# Patient Record
Sex: Male | Born: 1955 | Race: White | Hispanic: No | Marital: Single | Smoking: Never smoker
Health system: Southern US, Community
[De-identification: ages and names within clinical notes are randomized; demographics above are authoritative.]

## PROBLEM LIST (undated history)

## (undated) DIAGNOSIS — C801 Malignant (primary) neoplasm, unspecified: Secondary | ICD-10-CM

## (undated) DIAGNOSIS — I10 Essential (primary) hypertension: Secondary | ICD-10-CM

## (undated) DIAGNOSIS — E119 Type 2 diabetes mellitus without complications: Secondary | ICD-10-CM

## (undated) DIAGNOSIS — N289 Disorder of kidney and ureter, unspecified: Secondary | ICD-10-CM

## (undated) NOTE — *Deleted (*Deleted)
Cross Cover Note Patient admitted with Sepsi from CAP now with tachycardia. Previously elevated lactate noted to be secondary to metformin use and med has been held.  Patient now with tachycarda.  CBC noted for drop in HGB 7.9, previously 8.2 one month ago.  BP currently stable Repeat lactate Repeat CBC  Lactate, now normsl.  1 gm drop in hgb since this am  -  No evidence of active bleeding. Likely hemodilution from IV fluid therapy. Will check stool. Protonix added Tachycardia and pale on assessment, will transfuse one unit of blood  Discussed risks and benefits with POA and patient  POA at bedside reports prior to coming to hospital patient had episode while on toilet he stood up and appeared extremely bloated and complained of severe RUQ pain.  [place] she reports patient problem with nausea and vomiting over past couple months.  They both denied seeing blood in vomit or stool and no dark tarry stools.  Has had constipation and takes senokot daily. Last BM this am after getting lactulose  Patient wants to think about blood transfusion

---

## 2008-04-05 ENCOUNTER — Ambulatory Visit: Payer: Self-pay | Admitting: Family Medicine

## 2010-11-10 ENCOUNTER — Other Ambulatory Visit: Payer: Self-pay | Admitting: Rheumatology

## 2010-12-21 ENCOUNTER — Encounter: Payer: Self-pay | Admitting: Rheumatology

## 2011-01-03 ENCOUNTER — Encounter: Payer: Self-pay | Admitting: Rheumatology

## 2011-02-03 ENCOUNTER — Encounter: Payer: Self-pay | Admitting: Rheumatology

## 2011-03-06 ENCOUNTER — Encounter: Payer: Self-pay | Admitting: Rheumatology

## 2019-04-19 ENCOUNTER — Other Ambulatory Visit: Payer: Self-pay

## 2019-04-19 DIAGNOSIS — Z20822 Contact with and (suspected) exposure to covid-19: Secondary | ICD-10-CM

## 2019-04-20 ENCOUNTER — Telehealth: Payer: Self-pay | Admitting: General Practice

## 2019-04-20 LAB — NOVEL CORONAVIRUS, NAA: SARS-CoV-2, NAA: NOT DETECTED

## 2019-04-20 NOTE — Telephone Encounter (Signed)
Negative COVID results given. Patient results "NOT Detected." Caller expressed understanding. ° °

## 2019-05-04 ENCOUNTER — Other Ambulatory Visit: Payer: Self-pay

## 2019-05-04 DIAGNOSIS — Z20822 Contact with and (suspected) exposure to covid-19: Secondary | ICD-10-CM

## 2019-05-05 LAB — NOVEL CORONAVIRUS, NAA: SARS-CoV-2, NAA: NOT DETECTED

## 2019-05-07 ENCOUNTER — Telehealth: Payer: Self-pay | Admitting: General Practice

## 2019-05-07 NOTE — Telephone Encounter (Signed)
Negative COVID results given. Patient results "NOT Detected." Caller expressed understanding. ° °

## 2019-08-27 ENCOUNTER — Emergency Department: Payer: Medicaid Other

## 2019-08-27 ENCOUNTER — Encounter: Payer: Self-pay | Admitting: Emergency Medicine

## 2019-08-27 ENCOUNTER — Emergency Department
Admission: EM | Admit: 2019-08-27 | Discharge: 2019-08-27 | Disposition: A | Discharge status: Home / Self Care | Payer: Medicaid Other | Attending: Emergency Medicine | Admitting: Emergency Medicine

## 2019-08-27 ENCOUNTER — Other Ambulatory Visit: Payer: Self-pay

## 2019-08-27 DIAGNOSIS — I10 Essential (primary) hypertension: Secondary | ICD-10-CM | POA: Diagnosis not present

## 2019-08-27 DIAGNOSIS — R410 Disorientation, unspecified: Secondary | ICD-10-CM | POA: Diagnosis not present

## 2019-08-27 DIAGNOSIS — C649 Malignant neoplasm of unspecified kidney, except renal pelvis: Secondary | ICD-10-CM | POA: Insufficient documentation

## 2019-08-27 DIAGNOSIS — R109 Unspecified abdominal pain: Secondary | ICD-10-CM | POA: Diagnosis present

## 2019-08-27 HISTORY — DX: Malignant (primary) neoplasm, unspecified: C80.1

## 2019-08-27 HISTORY — DX: Essential (primary) hypertension: I10

## 2019-08-27 HISTORY — DX: Disorder of kidney and ureter, unspecified: N28.9

## 2019-08-27 LAB — CBC WITH DIFFERENTIAL/PLATELET
Abs Immature Granulocytes: 0.01 10*3/uL (ref 0.00–0.07)
Basophils Absolute: 0 10*3/uL (ref 0.0–0.1)
Basophils Relative: 1 %
Eosinophils Absolute: 0.1 10*3/uL (ref 0.0–0.5)
Eosinophils Relative: 2 %
HCT: 33.3 % — ABNORMAL LOW (ref 39.0–52.0)
Hemoglobin: 10.1 g/dL — ABNORMAL LOW (ref 13.0–17.0)
Immature Granulocytes: 0 %
Lymphocytes Relative: 18 %
Lymphs Abs: 1 10*3/uL (ref 0.7–4.0)
MCH: 22 pg — ABNORMAL LOW (ref 26.0–34.0)
MCHC: 30.3 g/dL (ref 30.0–36.0)
MCV: 72.4 fL — ABNORMAL LOW (ref 80.0–100.0)
Monocytes Absolute: 0.5 10*3/uL (ref 0.1–1.0)
Monocytes Relative: 9 %
Neutro Abs: 3.6 10*3/uL (ref 1.7–7.7)
Neutrophils Relative %: 70 %
Platelets: 99 10*3/uL — ABNORMAL LOW (ref 150–400)
RBC: 4.6 MIL/uL (ref 4.22–5.81)
RDW: 16.5 % — ABNORMAL HIGH (ref 11.5–15.5)
WBC: 5.2 10*3/uL (ref 4.0–10.5)
nRBC: 0 % (ref 0.0–0.2)

## 2019-08-27 LAB — COMPREHENSIVE METABOLIC PANEL
ALT: 32 U/L (ref 0–44)
AST: 78 U/L — ABNORMAL HIGH (ref 15–41)
Albumin: 3.1 g/dL — ABNORMAL LOW (ref 3.5–5.0)
Alkaline Phosphatase: 144 U/L — ABNORMAL HIGH (ref 38–126)
Anion gap: 13 (ref 5–15)
BUN: 19 mg/dL (ref 8–23)
CO2: 26 mmol/L (ref 22–32)
Calcium: 9.1 mg/dL (ref 8.9–10.3)
Chloride: 99 mmol/L (ref 98–111)
Creatinine, Ser: 0.87 mg/dL (ref 0.61–1.24)
GFR calc Af Amer: >60 mL/min (ref >60)
GFR calc non Af Amer: >60 mL/min (ref >60)
Glucose, Bld: 140 mg/dL — ABNORMAL HIGH (ref 70–99)
Potassium: 4.2 mmol/L (ref 3.5–5.1)
Sodium: 138 mmol/L (ref 135–145)
Total Bilirubin: 1.8 mg/dL — ABNORMAL HIGH (ref 0.3–1.2)
Total Protein: 8.7 g/dL — ABNORMAL HIGH (ref 6.5–8.1)

## 2019-08-27 LAB — URINALYSIS, COMPLETE (UACMP) WITH MICROSCOPIC
Bacteria, UA: NONE SEEN
Bilirubin Urine: NEGATIVE
Glucose, UA: 50 mg/dL — AB
Hgb urine dipstick: NEGATIVE
Ketones, ur: NEGATIVE mg/dL
Leukocytes,Ua: NEGATIVE
Nitrite: NEGATIVE
Protein, ur: 100 mg/dL — AB
Specific Gravity, Urine: 1.02 (ref 1.005–1.030)
pH: 6 (ref 5.0–8.0)

## 2019-08-27 LAB — AMMONIA: Ammonia: 20 umol/L (ref 9–35)

## 2019-08-27 MED ORDER — GADOBUTROL 1 MMOL/ML IV SOLN
9.0000 mL | Freq: Once | INTRAVENOUS | Status: AC | PRN
  Administered 2019-08-27: 9 mL via INTRAVENOUS
  Filled 2019-08-27: qty 10

## 2019-08-27 MED ORDER — LORAZEPAM 2 MG/ML IJ SOLN
1.0000 mg | Freq: Once | INTRAMUSCULAR | Status: AC
  Administered 2019-08-27: 1 mg via INTRAVENOUS
  Filled 2019-08-27: qty 1

## 2019-08-27 NOTE — ED Notes (Signed)
AAOx3.  Skin warm and dry.  NAD.  States he usually receives fluid at [facility] every week and he felt that his doctors office sent him to the ED to receive fluid for dehydration.  Reinforced that patient presented to the ED today for HTN and typically one does not receive fluid for that, but reassured patient that I would let Dr. Archie Balboa know his concern.

## 2019-08-27 NOTE — ED Notes (Signed)
Pt leaving for imaging.

## 2019-08-27 NOTE — ED Notes (Signed)
Pt remains on phone with MRI staff.

## 2019-08-27 NOTE — ED Notes (Signed)
Patient's significant other arrives to bedside.

## 2019-08-27 NOTE — ED Notes (Signed)
Pt remains off unit a CT. Will make request for urine sample once pt back to room.

## 2019-08-27 NOTE — ED Triage Notes (Signed)
Pt currently taking oral chemo for renal cancer.

## 2019-08-27 NOTE — ED Notes (Signed)
See triage note. Pt currently denies HA, CP, or nausea. States was nauseous earlier this morning but that it is typical since the chemo. Current BP 139/95; MAP 110. Pt in NAD currently. Reports did not take his Metoprolol this morning as his friend did not give it to him.

## 2019-08-27 NOTE — Discharge Instructions (Addendum)
Please seek medical attention for any high fevers, chest pain, shortness of breath, change in behavior, persistent vomiting, bloody stool or any other new or concerning symptoms.  

## 2019-08-27 NOTE — ED Notes (Signed)
Pt/wife signed printed d/c paperwork as topaz froze.

## 2019-08-27 NOTE — ED Triage Notes (Signed)
Pt here with c/o hypertension that began last night, states his Dr office sent him here to be evaluated, does take bp med but didn't take this am. NAD, denies pain.

## 2019-08-27 NOTE — ED Provider Notes (Signed)
[facility] Emergency Department Provider Note  ____________________________________________   I have reviewed the triage vital signs and the nursing notes.   HISTORY  Chief Complaint Abdominal pain  History limited by: Not Limited   HPI Hunter Davila is a 64 y.o. male who presents to the emergency department today at the request of his oncologist because of varied symptoms. The patient's first complaint is for some abdominal pain that he had starting last night. Located in the center part of his abdomen it was associated with some nausea. He denies any pain at the time of my exam. Additionally the patient states that he had fevers a couple of days ago but none today. Has also been having issues with his blood pressure and had a nosebleed earlier today. The patient says that he takes oral chemo at home and took a dose last night.    Records reviewed. Per medical record review patient has a history of renal cancer. Hypertension.  Past Medical History:  Diagnosis Date  . Cancer ([place])   . Hypertension   . Renal disorder    Renal cancer    There are no problems to display for this patient.   History reviewed. No pertinent surgical history.  Prior to Admission medications   Not on File    Allergies Patient has no known allergies.  No family history on file.  Social History Social History   Tobacco Use  . Smoking status: Never Smoker  . Smokeless tobacco: Never Used  Substance Use Topics  . Alcohol use: Not Currently  . Drug use: Not on file    Review of Systems Constitutional: Positive for fever a couple of days ago. Eyes: No visual changes. ENT: Positive for nose bleed. Cardiovascular: Denies chest pain. Respiratory: Denies shortness of breath. Gastrointestinal: Positive for abdominal pain and nausea.   Genitourinary: Negative for dysuria. Musculoskeletal: Negative for back pain. Skin: Negative for rash. Neurological: Positive  for headache. ____________________________________________   PHYSICAL EXAM:  VITAL SIGNS: ED Triage Vitals [08/27/19 1149]  Enc Vitals Group     BP (!) 157/94     Pulse Rate 86     Resp 18     Temp 98.3 F (36.8 C)     Temp Source Oral     SpO2 100 %     Weight 220 lb (99.8 kg)     Height 6' 2" (1.88 m)   Constitutional: Alert and oriented.  Eyes: Conjunctivae are normal.  ENT      Head: Normocephalic and atraumatic.      Nose: No congestion/rhinnorhea.      Mouth/Throat: Mucous membranes are moist.      Neck: No stridor. Hematological/Lymphatic/Immunilogical: No cervical lymphadenopathy. Cardiovascular: Normal rate, regular rhythm.  No murmurs, rubs, or gallops.  Respiratory: Normal respiratory effort without tachypnea nor retractions. Breath sounds are clear and equal bilaterally. No wheezes/rales/rhonchi. Gastrointestinal: Soft and non tender. No rebound. No guarding.  Genitourinary: Deferred Musculoskeletal: Normal range of motion in all extremities. No lower extremity edema. Neurologic:  Normal speech and language. No gross focal neurologic deficits are appreciated.  Skin:  Skin is warm, dry and intact. No rash noted. Psychiatric: Mood and affect are normal. Speech and behavior are normal. Patient exhibits appropriate insight and judgment.  ____________________________________________    LABS (pertinent positives/negatives)  CMP na 138, k 4.2, glu 140, cr 0.87, ast 78, alt 32, alk phos 144, t bili 1.8 CBC wbc 5.2, hgb 10.1, plt 99 UA clear, glucose 50,  protein 100, 6-10 wbc, bacteria none seen, leukocytes negative Ammonia 20 ____________________________________________   EKG  None  ____________________________________________    RADIOLOGY  CT head Punctate hyperdense legion in the left parietal lobe  MR brain No acute  abnormality  ____________________________________________   PROCEDURES  Procedures  ____________________________________________   INITIAL IMPRESSION / ASSESSMENT AND PLAN / ED COURSE  Pertinent labs & imaging results that were available during my care of the patient were reviewed by me and considered in my medical decision making (see chart for details).   Patient presented to the emergency department for a primary concern for confusion.  On initial exam patient seemed fairly lucid however during his stay here in the emergency department he did have signs of worsening confusion.  Did get a CT scan which was concerning for possible lesion in the left parietal lobe.  MRI was then obtained which did not show any concerning acute abnormalities.  Additionally no obvious infection.  I did discussion with patient and wife.  At this point no clear etiology of the patient's confusion and wife states that it has been intermittent over the past couple of days.  They already have appointment for follow-up with oncology tomorrow.  Patient and wife felt comfortable with discharge home to follow-up as scheduled tomorrow in clinic.  ____________________________________________   FINAL CLINICAL IMPRESSION(S) / ED DIAGNOSES  Final diagnoses:  Confusion  Hypertension, unspecified type     Note: This dictation was prepared with Dragon dictation. Any transcriptional errors that result from this process are unintentional     Nance Pear, MD 08/27/19 2121

## 2019-08-27 NOTE — ED Notes (Signed)
Plan of care reviewed with pt.

## 2019-08-27 NOTE — ED Notes (Signed)
Pt standing at bedside attempting to use urinal. Urine dark amber. Sample sent to lab.

## 2019-08-27 NOTE — ED Notes (Signed)
Patient becoming more confused.  Pacing in room and out to door frequently.  Does not respond well to redirection.  Patient states "I am not having that MRI, I'm not doing it.".  Dr. Archie Balboa alerted.  Patient on the phone speaking with significant other.  Significant other encouraged to come to hospital and stay with patient for duration of ED coarse, per Dr. Archie Balboa.  Significant other on her way to hospital at this time.  Front desk notified of expected visitor.

## 2019-08-27 NOTE — ED Notes (Signed)
Attempted to straight stick patient x 2 for PT/ INR.  Unsuccessful.  Patient states "I am a hard stick."  Refused further attempts by nursing.  Requesting phlebotomy to draw blood work.

## 2019-09-06 ENCOUNTER — Telehealth: Payer: Self-pay | Admitting: Adult Health Nurse Practitioner

## 2019-09-06 NOTE — Telephone Encounter (Signed)
Spoke with Alwyn Pea Gover (POA) regarding Palliative services and she was in agreement with starting services.  I have scheduled an In-home Consult for 09/17/19 @ 1:30 PM

## 2019-09-17 ENCOUNTER — Other Ambulatory Visit: Payer: Medicaid Other | Admitting: Adult Health Nurse Practitioner

## 2019-09-17 ENCOUNTER — Other Ambulatory Visit: Payer: Self-pay

## 2019-09-17 DIAGNOSIS — Z515 Encounter for palliative care: Secondary | ICD-10-CM

## 2019-09-17 DIAGNOSIS — C649 Malignant neoplasm of unspecified kidney, except renal pelvis: Secondary | ICD-10-CM

## 2019-09-17 NOTE — Progress Notes (Signed)
[facility] Consult Note Telephone: [phone]  Fax: [phone]  PATIENT NAME: Hunter Davila DOB: 12-29-1955 MRN: [id number]  PRIMARY CARE PROVIDER:   Patient, No Pcp Per  REFERRING PROVIDER:  Elwin Mocha, MD [address] [place],   [address]  RESPONSIBLE PARTY:   [facility]  Primary phone # [phone]  C: [phone]    RECOMMENDATIONS and PLAN:  1.  Advanced care planning.  Patient is full code.  Did not go over ACP too much as patient did not want to discuss about death.  Did indicate that he would not want to be on machines for any extended period of time.  2.  Renal Cancer.  Patient is currently on oral chemo with lenvima 10 mg daily and everolimus 5mg  daily.  Was on lenvima 18 mg daily and this caused encephalopathy, increased BP, and N/V after about 4 weeks of use.  He had a a 2 week off period and has restarted at a lower dose about 4 days ago.  So far is not complaining of pain, N/V/D, constipation, increased BP, headaches, dizziness, SOB, cough, edema, neuropathy.  He is able to get up and ambulate without assistive devices.  Perform ADLs independently.  He does not drive right now. Was told he should not be staying alone while on this therapy and  he stays with a friend who has been caring for him.  She does state that while he was on the higher does he was requiring some assistance with ADLs.  She states that he is doing much better now and is requiring less assistance.  A few weeks ago he was treated for a sinus infection.  He does get hydration one to two times per week at cancer center.  His appetite is good with no reported weight loss.  Has MRI scheduled in April.  Continue follow up and recommendations by oncology.    Patient appears to be improving after being taken off his oral chemo and restarting at a lower dose. Patient really did not want palliative services as he expresses that he does not  want to be reminded that he is dying.  He has agreed to them for his friend who is helping take care of him.  She states that she could use the support when something happens, such as when his BP was elevated, to know what to do.  Patient has agreed to continue with palliative services on an as needed basis for caregiver support.  Left contact info and encouraged to call with questions/concerns.  I spent 60 minutes providing this consultation,  from 1:30 to 2:30. More than 50% of the time in this consultation was spent coordinating communication.   HISTORY OF PRESENT ILLNESS:  Hunter Davila is a 64 y.o. year old male with multiple medical problems including renal cancer, HTN, hypothyroidism, DMT2. Palliative Care was asked to help address goals of care.   CODE STATUS: full code  PPS: 60% HOSPICE ELIGIBILITY/DIAGNOSIS: TBD  PHYSICAL EXAM:  BP 140/88  HR  93  O2 99% on RA General: NAD, frail appearing Cardiovascular: regular rate and rhythm Pulmonary: lung sounds clear; normal respiratory effort Abdomen: soft, nontender, + bowel sounds Extremities: no edema, no joint deformities Skin: no rashes on exposed skin Neurological: Weakness but otherwise nonfocal  PAST MEDICAL HISTORY:  Past Medical History:  Diagnosis Date  . Cancer ([place])   . Hypertension   . Renal disorder    Renal cancer  SOCIAL HX:  Social History   Tobacco Use  . Smoking status: Never Smoker  . Smokeless tobacco: Never Used  Substance Use Topics  . Alcohol use: Not Currently    ALLERGIES: No Known Allergies   PERTINENT MEDICATIONS:  No outpatient encounter medications on file as of 09/17/2019.   No facility-administered encounter medications on file as of 09/17/2019.      Tanecia Mccay Jenetta Downer, NP

## 2020-03-18 ENCOUNTER — Encounter: Payer: Self-pay | Admitting: Medical Oncology

## 2020-03-18 ENCOUNTER — Emergency Department: Payer: Medicare Other

## 2020-03-18 ENCOUNTER — Inpatient Hospital Stay
Admission: EM | Admit: 2020-03-18 | Discharge: 2020-03-20 | DRG: 682 | Disposition: A | Discharge status: Home Health | Payer: Medicare Other | Attending: Internal Medicine | Admitting: Internal Medicine

## 2020-03-18 ENCOUNTER — Other Ambulatory Visit: Payer: Self-pay

## 2020-03-18 DIAGNOSIS — C649 Malignant neoplasm of unspecified kidney, except renal pelvis: Secondary | ICD-10-CM | POA: Diagnosis present

## 2020-03-18 DIAGNOSIS — N400 Enlarged prostate without lower urinary tract symptoms: Secondary | ICD-10-CM | POA: Diagnosis present

## 2020-03-18 DIAGNOSIS — E860 Dehydration: Secondary | ICD-10-CM | POA: Diagnosis present

## 2020-03-18 DIAGNOSIS — C7931 Secondary malignant neoplasm of brain: Secondary | ICD-10-CM | POA: Diagnosis present

## 2020-03-18 DIAGNOSIS — Z20822 Contact with and (suspected) exposure to covid-19: Secondary | ICD-10-CM | POA: Diagnosis present

## 2020-03-18 DIAGNOSIS — R6520 Severe sepsis without septic shock: Secondary | ICD-10-CM | POA: Diagnosis not present

## 2020-03-18 DIAGNOSIS — I10 Essential (primary) hypertension: Secondary | ICD-10-CM | POA: Diagnosis present

## 2020-03-18 DIAGNOSIS — K219 Gastro-esophageal reflux disease without esophagitis: Secondary | ICD-10-CM | POA: Diagnosis present

## 2020-03-18 DIAGNOSIS — E872 Acidosis, unspecified: Secondary | ICD-10-CM

## 2020-03-18 DIAGNOSIS — R4689 Other symptoms and signs involving appearance and behavior: Secondary | ICD-10-CM

## 2020-03-18 DIAGNOSIS — C641 Malignant neoplasm of right kidney, except renal pelvis: Secondary | ICD-10-CM | POA: Diagnosis present

## 2020-03-18 DIAGNOSIS — D84821 Immunodeficiency due to drugs: Secondary | ICD-10-CM | POA: Diagnosis present

## 2020-03-18 DIAGNOSIS — E119 Type 2 diabetes mellitus without complications: Secondary | ICD-10-CM | POA: Diagnosis not present

## 2020-03-18 DIAGNOSIS — E785 Hyperlipidemia, unspecified: Secondary | ICD-10-CM | POA: Diagnosis present

## 2020-03-18 DIAGNOSIS — R778 Other specified abnormalities of plasma proteins: Secondary | ICD-10-CM

## 2020-03-18 DIAGNOSIS — J9811 Atelectasis: Secondary | ICD-10-CM | POA: Diagnosis present

## 2020-03-18 DIAGNOSIS — A419 Sepsis, unspecified organism: Secondary | ICD-10-CM

## 2020-03-18 DIAGNOSIS — R000 Tachycardia, unspecified: Secondary | ICD-10-CM

## 2020-03-18 DIAGNOSIS — R410 Disorientation, unspecified: Secondary | ICD-10-CM

## 2020-03-18 DIAGNOSIS — L723 Sebaceous cyst: Secondary | ICD-10-CM | POA: Diagnosis present

## 2020-03-18 DIAGNOSIS — N179 Acute kidney failure, unspecified: Principal | ICD-10-CM | POA: Diagnosis present

## 2020-03-18 DIAGNOSIS — T383X5A Adverse effect of insulin and oral hypoglycemic [antidiabetic] drugs, initial encounter: Secondary | ICD-10-CM | POA: Diagnosis present

## 2020-03-18 DIAGNOSIS — C7951 Secondary malignant neoplasm of bone: Secondary | ICD-10-CM | POA: Diagnosis present

## 2020-03-18 DIAGNOSIS — I700 Atherosclerosis of aorta: Secondary | ICD-10-CM | POA: Diagnosis present

## 2020-03-18 DIAGNOSIS — Z79890 Hormone replacement therapy (postmenopausal): Secondary | ICD-10-CM

## 2020-03-18 DIAGNOSIS — I2510 Atherosclerotic heart disease of native coronary artery without angina pectoris: Secondary | ICD-10-CM | POA: Diagnosis present

## 2020-03-18 DIAGNOSIS — C7989 Secondary malignant neoplasm of other specified sites: Secondary | ICD-10-CM | POA: Diagnosis present

## 2020-03-18 DIAGNOSIS — N3000 Acute cystitis without hematuria: Secondary | ICD-10-CM

## 2020-03-18 DIAGNOSIS — R6510 Systemic inflammatory response syndrome (SIRS) of non-infectious origin without acute organ dysfunction: Secondary | ICD-10-CM | POA: Diagnosis not present

## 2020-03-18 DIAGNOSIS — E039 Hypothyroidism, unspecified: Secondary | ICD-10-CM | POA: Diagnosis present

## 2020-03-18 DIAGNOSIS — Z66 Do not resuscitate: Secondary | ICD-10-CM | POA: Diagnosis present

## 2020-03-18 DIAGNOSIS — R509 Fever, unspecified: Secondary | ICD-10-CM

## 2020-03-18 DIAGNOSIS — Z8042 Family history of malignant neoplasm of prostate: Secondary | ICD-10-CM

## 2020-03-18 DIAGNOSIS — Z79899 Other long term (current) drug therapy: Secondary | ICD-10-CM

## 2020-03-18 DIAGNOSIS — E118 Type 2 diabetes mellitus with unspecified complications: Secondary | ICD-10-CM | POA: Diagnosis not present

## 2020-03-18 DIAGNOSIS — G9341 Metabolic encephalopathy: Secondary | ICD-10-CM | POA: Diagnosis present

## 2020-03-18 DIAGNOSIS — Z7984 Long term (current) use of oral hypoglycemic drugs: Secondary | ICD-10-CM | POA: Diagnosis not present

## 2020-03-18 DIAGNOSIS — R4182 Altered mental status, unspecified: Secondary | ICD-10-CM | POA: Diagnosis present

## 2020-03-18 DIAGNOSIS — Z833 Family history of diabetes mellitus: Secondary | ICD-10-CM

## 2020-03-18 DIAGNOSIS — R7989 Other specified abnormal findings of blood chemistry: Secondary | ICD-10-CM | POA: Diagnosis present

## 2020-03-18 HISTORY — DX: Type 2 diabetes mellitus without complications: E11.9

## 2020-03-18 LAB — MAGNESIUM: Magnesium: 1.7 mg/dL (ref 1.7–2.4)

## 2020-03-18 LAB — AMMONIA: Ammonia: 15 umol/L (ref 9–35)

## 2020-03-18 LAB — COMPREHENSIVE METABOLIC PANEL
ALT: 10 U/L (ref 0–44)
AST: 19 U/L (ref 15–41)
Albumin: 3.1 g/dL — ABNORMAL LOW (ref 3.5–5.0)
Alkaline Phosphatase: 89 U/L (ref 38–126)
Anion gap: 17 — ABNORMAL HIGH (ref 5–15)
BUN: 21 mg/dL (ref 8–23)
CO2: 22 mmol/L (ref 22–32)
Calcium: 9.4 mg/dL (ref 8.9–10.3)
Chloride: 97 mmol/L — ABNORMAL LOW (ref 98–111)
Creatinine, Ser: 1.45 mg/dL — ABNORMAL HIGH (ref 0.61–1.24)
GFR calc Af Amer: 59 mL/min — ABNORMAL LOW (ref >60)
GFR calc non Af Amer: 51 mL/min — ABNORMAL LOW (ref >60)
Glucose, Bld: 172 mg/dL — ABNORMAL HIGH (ref 70–99)
Potassium: 3.9 mmol/L (ref 3.5–5.1)
Sodium: 136 mmol/L (ref 135–145)
Total Bilirubin: 0.5 mg/dL (ref 0.3–1.2)
Total Protein: 8 g/dL (ref 6.5–8.1)

## 2020-03-18 LAB — CBC WITH DIFFERENTIAL/PLATELET
Abs Immature Granulocytes: 0.02 10*3/uL (ref 0.00–0.07)
Abs Immature Granulocytes: 0.02 10*3/uL (ref 0.00–0.07)
Basophils Absolute: 0 10*3/uL (ref 0.0–0.1)
Basophils Absolute: 0 10*3/uL (ref 0.0–0.1)
Basophils Relative: 0 %
Basophils Relative: 0 %
Eosinophils Absolute: 0 10*3/uL (ref 0.0–0.5)
Eosinophils Absolute: 0 10*3/uL (ref 0.0–0.5)
Eosinophils Relative: 0 %
Eosinophils Relative: 1 %
HCT: 27.8 % — ABNORMAL LOW (ref 39.0–52.0)
HCT: 27.6 % — ABNORMAL LOW (ref 39.0–52.0)
Hemoglobin: 8.6 g/dL — ABNORMAL LOW (ref 13.0–17.0)
Hemoglobin: 8.4 g/dL — ABNORMAL LOW (ref 13.0–17.0)
Immature Granulocytes: 0 %
Immature Granulocytes: 0 %
Lymphocytes Relative: 6 %
Lymphocytes Relative: 10 %
Lymphs Abs: 0.4 10*3/uL — ABNORMAL LOW (ref 0.7–4.0)
Lymphs Abs: 0.6 10*3/uL — ABNORMAL LOW (ref 0.7–4.0)
MCH: 22.6 pg — ABNORMAL LOW (ref 26.0–34.0)
MCH: 22.2 pg — ABNORMAL LOW (ref 26.0–34.0)
MCHC: 30.9 g/dL (ref 30.0–36.0)
MCHC: 30.4 g/dL (ref 30.0–36.0)
MCV: 73 fL — ABNORMAL LOW (ref 80.0–100.0)
MCV: 73 fL — ABNORMAL LOW (ref 80.0–100.0)
Monocytes Absolute: 0.5 10*3/uL (ref 0.1–1.0)
Monocytes Absolute: 0.6 10*3/uL (ref 0.1–1.0)
Monocytes Relative: 7 %
Monocytes Relative: 10 %
Neutro Abs: 6.1 10*3/uL (ref 1.7–7.7)
Neutro Abs: 4.7 10*3/uL (ref 1.7–7.7)
Neutrophils Relative %: 87 %
Neutrophils Relative %: 79 %
Platelets: 279 10*3/uL (ref 150–400)
Platelets: 246 10*3/uL (ref 150–400)
RBC: 3.81 MIL/uL — ABNORMAL LOW (ref 4.22–5.81)
RBC: 3.78 MIL/uL — ABNORMAL LOW (ref 4.22–5.81)
RDW: 17.5 % — ABNORMAL HIGH (ref 11.5–15.5)
RDW: 17.7 % — ABNORMAL HIGH (ref 11.5–15.5)
WBC: 7 10*3/uL (ref 4.0–10.5)
WBC: 6 10*3/uL (ref 4.0–10.5)
nRBC: 0 % (ref 0.0–0.2)
nRBC: 0 % (ref 0.0–0.2)

## 2020-03-18 LAB — URINALYSIS, COMPLETE (UACMP) WITH MICROSCOPIC
Bacteria, UA: NONE SEEN
Bilirubin Urine: NEGATIVE
Glucose, UA: 50 mg/dL — AB
Ketones, ur: NEGATIVE mg/dL
Leukocytes,Ua: NEGATIVE
Nitrite: NEGATIVE
Protein, ur: 100 mg/dL — AB
Specific Gravity, Urine: 1.023 (ref 1.005–1.030)
pH: 5 (ref 5.0–8.0)

## 2020-03-18 LAB — GLUCOSE, CAPILLARY: Glucose-Capillary: 149 mg/dL — ABNORMAL HIGH (ref 70–99)

## 2020-03-18 LAB — LACTIC ACID, PLASMA
Lactic Acid, Venous: 3.6 mmol/L (ref 0.5–1.9)
Lactic Acid, Venous: 1.5 mmol/L (ref 0.5–1.9)

## 2020-03-18 LAB — TROPONIN I (HIGH SENSITIVITY)
Troponin I (High Sensitivity): 42 ng/L — ABNORMAL HIGH (ref <18)
Troponin I (High Sensitivity): 28 ng/L — ABNORMAL HIGH (ref <18)

## 2020-03-18 LAB — BRAIN NATRIURETIC PEPTIDE: B Natriuretic Peptide: 49.3 pg/mL (ref 0.0–100.0)

## 2020-03-18 LAB — PROCALCITONIN: Procalcitonin: 0.53 ng/mL

## 2020-03-18 LAB — SARS CORONAVIRUS 2 BY RT PCR (HOSPITAL ORDER, PERFORMED IN ~~LOC~~ HOSPITAL LAB): SARS Coronavirus 2: NEGATIVE

## 2020-03-18 MED ORDER — SODIUM CHLORIDE 0.9 % IV SOLN
2.0000 g | Freq: Three times a day (TID) | INTRAVENOUS | Status: DC
  Administered 2020-03-18 – 2020-03-19 (×4): 2 g via INTRAVENOUS
  Filled 2020-03-18 (×11): qty 2

## 2020-03-18 MED ORDER — LORAZEPAM 2 MG/ML IJ SOLN
1.0000 mg | Freq: Once | INTRAMUSCULAR | Status: AC
  Administered 2020-03-18: 1 mg via INTRAVENOUS
  Filled 2020-03-18: qty 1

## 2020-03-18 MED ORDER — LACTATED RINGERS IV SOLN: INTRAVENOUS | Status: DC

## 2020-03-18 MED ORDER — LACTATED RINGERS IV BOLUS
1000.0000 mL | Freq: Once | INTRAVENOUS | Status: AC
  Administered 2020-03-18: 1000 mL via INTRAVENOUS

## 2020-03-18 MED ORDER — TAMSULOSIN HCL 0.4 MG PO CAPS
0.4000 mg | ORAL_CAPSULE | Freq: Every day | ORAL | Status: DC
  Administered 2020-03-18 – 2020-03-20 (×3): 0.4 mg via ORAL
  Filled 2020-03-18 (×3): qty 1

## 2020-03-18 MED ORDER — VANCOMYCIN HCL 1250 MG/250ML IV SOLN
1250.0000 mg | INTRAVENOUS | Status: DC
  Filled 2020-03-18: qty 250

## 2020-03-18 MED ORDER — MIDAZOLAM HCL 2 MG/2ML IJ SOLN
INTRAMUSCULAR | Status: AC
  Administered 2020-03-18: 2 mg via INTRAMUSCULAR
  Filled 2020-03-18: qty 2

## 2020-03-18 MED ORDER — LACTATED RINGERS IV BOLUS
2000.0000 mL | Freq: Once | INTRAVENOUS | Status: AC
  Administered 2020-03-18: 2000 mL via INTRAVENOUS

## 2020-03-18 MED ORDER — HALOPERIDOL LACTATE 5 MG/ML IJ SOLN
5.0000 mg | Freq: Once | INTRAMUSCULAR | Status: AC
  Administered 2020-03-18: 5 mg via INTRAVENOUS
  Filled 2020-03-18: qty 1

## 2020-03-18 MED ORDER — LACTATED RINGERS IV BOLUS: 1000.0000 mL | Freq: Once | INTRAVENOUS | Status: DC

## 2020-03-18 MED ORDER — OXYCODONE HCL 5 MG PO TABS
5.0000 mg | ORAL_TABLET | ORAL | Status: DC | PRN
  Administered 2020-03-19 – 2020-03-20 (×5): 5 mg via ORAL
  Filled 2020-03-18 (×5): qty 1

## 2020-03-18 MED ORDER — MIDAZOLAM HCL 2 MG/2ML IJ SOLN: 2.0000 mg | Freq: Once | INTRAMUSCULAR | Status: AC

## 2020-03-18 MED ORDER — INSULIN ASPART 100 UNIT/ML ~~LOC~~ SOLN
0.0000 [IU] | Freq: Three times a day (TID) | SUBCUTANEOUS | Status: DC
  Administered 2020-03-19 (×2): 1 [IU] via SUBCUTANEOUS
  Administered 2020-03-20 (×2): 2 [IU] via SUBCUTANEOUS
  Filled 2020-03-18 (×4): qty 1

## 2020-03-18 MED ORDER — ACETAMINOPHEN 325 MG PO TABS: 650.0000 mg | ORAL_TABLET | Freq: Four times a day (QID) | ORAL | Status: DC | PRN

## 2020-03-18 MED ORDER — SENNA 8.6 MG PO TABS
1.0000 | ORAL_TABLET | Freq: Every day | ORAL | Status: DC
  Administered 2020-03-18 – 2020-03-20 (×3): 8.6 mg via ORAL
  Filled 2020-03-18 (×3): qty 1

## 2020-03-18 MED ORDER — CLOBETASOL PROPIONATE 0.05 % EX CREA
TOPICAL_CREAM | Freq: Two times a day (BID) | CUTANEOUS | Status: DC
  Filled 2020-03-18 (×2): qty 15

## 2020-03-18 MED ORDER — HYDRALAZINE HCL 20 MG/ML IJ SOLN: 5.0000 mg | INTRAMUSCULAR | Status: DC | PRN

## 2020-03-18 MED ORDER — ONDANSETRON HCL 4 MG/2ML IJ SOLN
4.0000 mg | Freq: Three times a day (TID) | INTRAMUSCULAR | Status: DC | PRN
  Administered 2020-03-20 (×2): 4 mg via INTRAVENOUS
  Filled 2020-03-18 (×2): qty 2

## 2020-03-18 MED ORDER — VANCOMYCIN HCL 2000 MG/400ML IV SOLN
2000.0000 mg | Freq: Once | INTRAVENOUS | Status: AC
  Administered 2020-03-19: 2000 mg via INTRAVENOUS
  Filled 2020-03-18: qty 400

## 2020-03-18 MED ORDER — INSULIN ASPART 100 UNIT/ML ~~LOC~~ SOLN: 0.0000 [IU] | Freq: Every day | SUBCUTANEOUS | Status: DC

## 2020-03-18 MED ORDER — FAMOTIDINE IN NACL 20-0.9 MG/50ML-% IV SOLN
20.0000 mg | Freq: Two times a day (BID) | INTRAVENOUS | Status: DC
  Administered 2020-03-19 – 2020-03-20 (×4): 20 mg via INTRAVENOUS
  Filled 2020-03-18 (×4): qty 50

## 2020-03-18 MED ORDER — ACETAMINOPHEN 650 MG RE SUPP: 650.0000 mg | Freq: Four times a day (QID) | RECTAL | Status: DC | PRN

## 2020-03-18 MED ORDER — LEVOTHYROXINE SODIUM 25 MCG PO TABS
125.0000 ug | ORAL_TABLET | Freq: Every day | ORAL | Status: DC
  Administered 2020-03-19 – 2020-03-20 (×2): 125 ug via ORAL
  Filled 2020-03-18: qty 1
  Filled 2020-03-18: qty 3

## 2020-03-18 MED ORDER — AMLODIPINE BESYLATE 10 MG PO TABS
10.0000 mg | ORAL_TABLET | Freq: Every day | ORAL | Status: DC
  Administered 2020-03-18 – 2020-03-20 (×3): 10 mg via ORAL
  Filled 2020-03-18: qty 1
  Filled 2020-03-18 (×2): qty 2

## 2020-03-18 MED ORDER — SODIUM CHLORIDE 0.9 % IV SOLN
2.0000 g | Freq: Once | INTRAVENOUS | Status: AC
  Administered 2020-03-18: 2 g via INTRAVENOUS
  Filled 2020-03-18: qty 2

## 2020-03-18 MED ORDER — HALOPERIDOL LACTATE 5 MG/ML IJ SOLN
2.0000 mg | Freq: Once | INTRAMUSCULAR | Status: AC
  Administered 2020-03-18: 2 mg via INTRAVENOUS
  Filled 2020-03-18: qty 1

## 2020-03-18 MED ORDER — IOHEXOL 300 MG/ML  SOLN
100.0000 mL | Freq: Once | INTRAMUSCULAR | Status: AC | PRN
  Administered 2020-03-18: 100 mL via INTRAVENOUS

## 2020-03-18 MED ORDER — HALOPERIDOL LACTATE 5 MG/ML IJ SOLN
3.0000 mg | Freq: Once | INTRAMUSCULAR | Status: AC
  Administered 2020-03-18: 3 mg via INTRAVENOUS
  Filled 2020-03-18: qty 1

## 2020-03-18 NOTE — Progress Notes (Signed)
CODE SEPSIS - PHARMACY COMMUNICATION  **Broad Spectrum Antibiotics should be administered within 1 hour of Sepsis diagnosis**  Time Code Sepsis Called/Page Received: 9/14@ 1636  Antibiotics Ordered: cefepime/vancomycin  Time of 1st antibiotic administration: 9/14@ 1500 (before sepsis code)  Additional action taken by pharmacy: none  If necessary, Name of Provider/Nurse Contacted: [place] ,PharmD Clinical Pharmacist  03/18/2020  5:11 PM

## 2020-03-18 NOTE — Progress Notes (Signed)
Pharmacy Antibiotic Note  Hunter Davila is a 64 y.o. male with history of advanced RCC with brain metastasis currently being treated with lenvatinib and everolimus admitted on 03/18/2020 with AMS and found to have sepsis of unknown origin. Pharmacy has been consulted for vancomycin and cefepime dosing.  WBC wnl (but on immunotherapy), Tmax 100.5. LA elevated at 3.6. SCr also elevated above baseline at 1.45 (baseline <1). No known drug allergies noted.  UA with mod Hgb but no bacteria, nitrite/leuk negative. CT shows bilat pulmonary nodules likely metastatic but unable to r/o infection  Plan: Vancomycin 2g IV now  Then start vancomycin 1250mg  IV q24h  Cefepime 2g IV q8h Monitor clinical picture, renal function, vanc levels Css if needed per duration of therapy F/U C&S, abx de-escalation, LOT   Height: 6\' 2"  (188 cm) Weight: 99 kg (218 lb 4.1 oz) IBW/kg (Calculated) : 82.2  Temp (24hrs), Avg:100.5 F (38.1 C), Min:100.5 F (38.1 C), Max:100.5 F (38.1 C)  Recent Labs  Lab 03/18/20 1149  WBC 7.0  CREATININE 1.45*  LATICACIDVEN 3.6*    Estimated Creatinine Clearance: 66.4 mL/min (A) (by C-G formula based on SCr of 1.45 mg/dL (H)).    No Known Allergies  Antimicrobials this admission: Vancomycin 9/14 > Cefepime 9/14>  Microbiology results: 9/14 BCx: in process 9/14 UCx: sent   Brendolyn Patty, PharmD Clinical Pharmacist  03/18/2020   4:48 PM

## 2020-03-18 NOTE — ED Notes (Signed)
Patient given Ativan 1mg  due to restlessness and moving, CT unable to get procedure done. Xray attempting to complete their procedure now

## 2020-03-18 NOTE — ED Triage Notes (Signed)
Pt from home via ems- pts friend called 911 d/t pts altered mental status. Per friend pt has been normal until today. Has not taken meds since Sunday. Hx of renal cancer.

## 2020-03-18 NOTE — ED Notes (Signed)
Pt asked to urinate and was assisted to sit on side of bed and use urinal. Pt then tried to push past and go to the door to try and leave. Pt was sat back down on bed and laid back down.  Since then, he has asked to urinate multiple times but only tries to push past and leave the room.

## 2020-03-18 NOTE — ED Notes (Signed)
Rozetta Nunnery [phone], patients friend and POA would like to be notified of any updates

## 2020-03-18 NOTE — H&P (Addendum)
History and Physical    Hunter Davila PYP:[id number] DOB: 08/26/1955 DOA: 03/18/2020  Referring MD/NP/PA:   PCP: Patient, No Pcp Per   Patient coming from:  The patient is coming from home.  At baseline, pt is independent for most of ADL.        Chief Complaint: AMS  HPI: Hunter Davila is a 64 y.o. male with medical history significant of metastasized stage IV renal cancer, hypertension, diabetes mellitus, GERD, hypothyroidism, who presents with altered mental status.  Patient has altered mental status, cannot provide medical history.  The medical history is provided by his friend who is his power of attorney. She reports seeing him daily and caring for him day-to-day.  Per his friend, patient has stage IV renal cell carcinoma.  Patient is following up with [facility].  Patient had systemic chemotherapy, radiation therapy and currently is receiving oral chemotherapy. Pt is on lenvatinib and everolimus. Initially pt on high dose of Lenvatinib, but he did not tolerate the high-dose since he developed encephalopathy. The dose was decreased from 18 to 10 mg, then recently increased back to 14 mg. Pt has been confused since yesterday. She found him "talking in circles".  She has nausea and dry heaves, does not seem to have abdominal pain or diarrhea.  Does not seem to have chest pain, cough, shortness of breath.  In the past several days, patient has increased urinary frequency, but did not complain of dysuria or burning on urination.  Patient moves all extremities.  No facial droop or slurred speech.  Patient is fully vaccinated for Covid19.  ED Course: pt was found to have WBC 7.0, negative Covid PCR, ammonia level 15, lactic acid 3.6, troponin level 42, AKI with creatinine 1.45, BUN 21 (creatinine 0.87 on 08/27/2019), urinalysis (cloudy appearance, no leukocyte, negative bacteria, WBC 11-20), temperature 100.5, blood pressure 184/97, heart rate 118, RR 22, oxygen saturation 96% on  room air.  Patient is admitted to [place] bed as inpatient.  CT of head: 1. No acute abnormality 2. Moderate atrophy, stable 3. 5 mm hyperdensity left posterior temporal lobe is unchanged from prior studies and compatible with chronic hemorrhage.   CT of the chest/abdomen/pelvis: 1. Infiltrative renal cell carcinoma with abdominal nodal and osseous metastasis. 2. Tumor and/or adenopathy surrounding the right renal vein and IVC. Suboptimal evaluation for direct vascular involvement (which is suspected) secondary to bolus timing. No extension of thrombus or tumor into the more superior portion of the IVC. 3. Pulmonary nodularity, most likely due to metastatic disease. Without priors for comparison, some of the ill-defined nodules are indeterminate and atypical infection would be a possibility. 4. Borderline mediastinal adenopathy, indeterminate. 5. Small left pleural effusion with adjacent left lower lobe atelectasis. 6. Coronary artery atherosclerosis. Aortic Atherosclerosis (ICD10-I70.0). 7. A right chest wall subcutaneous lesion is most likely a sebaceous cyst but could be correlated with physical exam.  Review of Systems: Could not be reviewed due to altered mental status.  Allergy: No Known Allergies  Past Medical History:  Diagnosis Date  . Cancer ([place])   . Diabetes mellitus without complication ([place])   . Hypertension   . Renal disorder    Renal cancer    History reviewed. No pertinent surgical history.  Social History:  reports that he has never smoked. He has never used smokeless tobacco. He reports previous alcohol use. No history on file for drug use.  Family History:  Family History  Problem Relation Age of Onset  . Diabetes Mellitus  II Mother   . Prostate cancer Father      Prior to Admission medications   Medication Sig Start Date End Date Taking? Authorizing Provider  amLODipine (NORVASC) 10 MG tablet Take 10 mg by mouth daily. 02/27/20  Yes [provider]  clobetasol (TEMOVATE) 0.05 % external solution Apply 1-2 times per day to the affected scalp areas as directed   Yes [provider]  diclofenac Sodium (VOLTAREN) 1 % GEL Apply 2 g topically 4 (four) times daily.   Yes [provider]  everolimus (AFINITOR) 5 MG tablet Take 5 mg by mouth at bedtime. 03/16/20  Yes [provider]  famotidine (PEPCID) 20 MG tablet Take 20 mg by mouth 2 (two) times daily.   Yes [provider]  ketoconazole (NIZORAL) 2 % shampoo Apply 1 application topically 3 (three) times a week. Apply 1-3 times a week on scalp. Let sit 10 minutes then rinse out 12/11/19  Yes [provider]  lenvatinib 14 mg daily dose (LENVIMA) 10 & 4 MG capsule Take 14 mg by mouth daily.   Yes [provider]  levothyroxine (SYNTHROID) 125 MCG tablet Take 125 mcg by mouth daily. 03/17/20  Yes [provider]  metFORMIN (GLUCOPHAGE) 1000 MG tablet Take 1,000 mg by mouth 2 (two) times daily. 02/26/20  Yes [provider]  oxyCODONE (OXY IR/ROXICODONE) 5 MG immediate release tablet Take 5 mg by mouth every 4 (four) hours as needed for pain. 03/05/20  Yes [provider]  potassium chloride SA (KLOR-CON) 20 MEQ tablet Take 20 mEq by mouth 2 (two) times daily. 03/12/20  Yes [provider]  senna (SENOKOT) 8.6 MG tablet Take 1 tablet by mouth daily. 11/28/19 11/27/20 Yes [provider]  tamsulosin (FLOMAX) 0.4 MG CAPS capsule Take 0.4 mg by mouth daily. Take 30 minutes after same meal each day 03/17/20  Yes [provider]  triamcinolone cream (KENALOG) 0.1 % Apply topically 2 (two) times daily Apply to affected area with rash ONLY   Yes [provider]    Physical Exam: Vitals:   03/18/20 1039 03/18/20 1041 03/18/20 1127 03/18/20 1159  BP: (!) 159/103  (!) 184/92 (!) 184/97  Pulse: (!) 118  (!) 111 (!) 102  Resp: (!) 21  (!) 21 (!) 22  Temp: (!) 100.5 F (38.1 C)       TempSrc: Oral     SpO2: 100%  94% 96%  Weight:  99 kg    Height:  6\' 2"  (1.88 m)     General: Not in acute distress HEENT:       Eyes: PERRL, EOMI, no scleral icterus.       ENT: No discharge from the ears and nose       Neck: No JVD, no bruit, no mass felt. Heme: No neck lymph node enlargement. Cardiac: S1/S2, RRR, No murmurs, No gallops or rubs. Respiratory: No rales, wheezing, rhonchi or rubs. GI: Soft, nondistended, nontender,no organomegaly, BS present. GU: No hematuria Ext: No pitting leg edema bilaterally. 1+DP/PT pulse bilaterally. Musculoskeletal: No joint deformities, No joint redness or warmth, no limitation of ROM in spin. Skin: No rashes.  Neuro: pt is confused, not oriented X3, cranial nerves II-XII grossly intact, moves all extremities. Neck supple. Psych: Patient is not psychotic, no suicidal or hemocidal ideation.  Labs on Admission: I have personally reviewed following labs and imaging studies  CBC: Recent Labs  Lab 03/18/20 1149  WBC 7.0  NEUTROABS 6.1  HGB 8.6*  HCT 27.8*  MCV 73.0*  PLT 469   Basic Metabolic Panel: Recent Labs  Lab 03/18/20 1149  NA 136  K 3.9  CL 97*  CO2 22  GLUCOSE 172*  BUN 21  CREATININE 1.45*  CALCIUM 9.4  MG 1.7   GFR: Estimated Creatinine Clearance: 66.4 mL/min (A) (by C-G formula based on SCr of 1.45 mg/dL (H)). Liver Function Tests: Recent Labs  Lab 03/18/20 1149  AST 19  ALT 10  ALKPHOS 89  BILITOT 0.5  PROT 8.0  ALBUMIN 3.1*   No results for input(s): LIPASE, AMYLASE in the last 168 hours. Recent Labs  Lab 03/18/20 1149  AMMONIA 15   Coagulation Profile: No results for input(s): INR, PROTIME in the last 168 hours. Cardiac Enzymes: No results for input(s): CKTOTAL, CKMB, CKMBINDEX, TROPONINI in the last 168 hours. BNP (last 3 results) No results for input(s): PROBNP in the last 8760 hours. HbA1C: No results for input(s): HGBA1C in the last 72 hours. CBG: No results for input(s): GLUCAP in  the last 168 hours. Lipid Profile: No results for input(s): CHOL, HDL, LDLCALC, TRIG, CHOLHDL, LDLDIRECT in the last 72 hours. Thyroid Function Tests: No results for input(s): TSH, T4TOTAL, FREET4, T3FREE, THYROIDAB in the last 72 hours. Anemia Panel: No results for input(s): VITAMINB12, FOLATE, FERRITIN, TIBC, IRON, RETICCTPCT in the last 72 hours. Urine analysis:    Component Value Date/Time   COLORURINE YELLOW (A) 03/18/2020 1044   APPEARANCEUR CLOUDY (A) 03/18/2020 1044   LABSPEC 1.023 03/18/2020 1044   PHURINE 5.0 03/18/2020 1044   GLUCOSEU 50 (A) 03/18/2020 1044   HGBUR MODERATE (A) 03/18/2020 1044   BILIRUBINUR NEGATIVE 03/18/2020 1044   [place] 03/18/2020 1044   PROTEINUR 100 (A) 03/18/2020 1044   NITRITE NEGATIVE 03/18/2020 1044   LEUKOCYTESUR NEGATIVE 03/18/2020 1044   Sepsis Labs: @LABRCNTIP (procalcitonin:4,lacticidven:4) ) Recent Results (from the past 240 hour(s))  SARS Coronavirus 2 by RT PCR (hospital order, performed in [place] hospital lab) Nasopharyngeal Nasopharyngeal Swab     Status: None   Collection Time: 03/18/20 11:49 AM   Specimen: Nasopharyngeal Swab  Result Value Ref Range Status   SARS Coronavirus 2 NEGATIVE NEGATIVE Final    Comment: (NOTE) SARS-CoV-2 target nucleic acids are NOT DETECTED.  The SARS-CoV-2 RNA is generally detectable in upper and lower respiratory specimens during the acute phase of infection. The lowest concentration of SARS-CoV-2 viral copies this assay can detect is 250 copies / mL. A negative result does not preclude SARS-CoV-2 infection and should not be used as the sole basis for treatment or other patient management decisions.  A negative result may occur with improper specimen collection / handling, submission of specimen other than nasopharyngeal swab, presence of viral mutation(s) within the areas targeted by this assay, and inadequate number of viral copies (<250 copies / mL). A negative result must be  combined with clinical observations, patient history, and epidemiological information.  Fact Sheet for Patients:   StrictlyIdeas.no  Fact Sheet for Healthcare Providers: BankingDealers.co.za  This test is not yet approved or  cleared by the [place] FDA and has been authorized for detection and/or diagnosis of SARS-CoV-2 by FDA under an Emergency Use Authorization (EUA).  This EUA will remain in effect (meaning this test can be used) for the duration of the COVID-19 declaration under Section 564(b)(1) of the Act, 21 U.S.C. section 360bbb-3(b)(1), unless the authorization is terminated or revoked sooner.  Performed at [facility], [address], [place], [place] [address]  Radiological Exams on Admission: CT Head Wo Contrast  Result Date: 03/18/2020 CLINICAL DATA:  Mental status change.  Renal cell carcinoma EXAM: CT HEAD WITHOUT CONTRAST TECHNIQUE: Contiguous axial images were obtained from the base of the skull through the vertex without intravenous contrast. COMPARISON:  MRI head 08/27/2019 in CT head 08/27/2019 FINDINGS: Brain: Generalized atrophy with mild ventricular enlargement appear stable. Negative for acute infarct 5 mm subtle hyperdensity in the left temporal lobe lateral to the atria. This is unchanged from prior studies and likely is an area of chronic hemorrhage. This was also identified on MRI. No area of acute hemorrhage or mass. Vascular: Negative for hyperdense vessel Skull: Negative Sinuses/Orbits: Paranasal sinuses clear.  Negative orbit Other: None IMPRESSION: No acute abnormality Moderate atrophy, stable 5 mm hyperdensity left posterior temporal lobe is unchanged from prior studies and compatible with chronic hemorrhage. Electronically Signed   By: Franchot Gallo M.D.   On: 03/18/2020 14:54   CT CHEST ABDOMEN PELVIS W CONTRAST  Result Date: 03/18/2020 CLINICAL DATA:  Abdominal pain. Known metastatic  renal cell carcinoma. Febrile. Evaluate for progression of disease and infection. EXAM: CT CHEST, ABDOMEN, AND PELVIS WITH CONTRAST TECHNIQUE: Multidetector CT imaging of the chest, abdomen and pelvis was performed following the standard protocol during bolus administration of intravenous contrast. CONTRAST:  168mL OMNIPAQUE IOHEXOL 300 MG/ML  SOLN COMPARISON:  None. FINDINGS: CT CHEST FINDINGS Cardiovascular: Aortic atherosclerosis. Borderline cardiomegaly, without pericardial effusion. Multivessel coronary artery atherosclerosis. No central pulmonary embolism, on this non-dedicated study. Mediastinum/Nodes: No supraclavicular adenopathy. Borderline right paratracheal adenopathy 1.0 cm on 18/2. No hilar adenopathy.  Tiny hiatal hernia. Lungs/Pleura: Trace left pleural fluid. Bilateral pulmonary nodules. Many of these are somewhat vague and ill-defined. A relatively well-defined right lower lobe pulmonary nodule measures 8 mm on 90/5. A lingular 7 mm nodule on 58/5 is likely sub solid in morphology. Medial left upper lobe ground-glass nodule of 1.0 cm on 33/5. Left base subsegmental atelectasis. Musculoskeletal: Right chest wall subcutaneous nodule of 1.4 cm on 37/2. Lytic lesion within the right-side of the T12 vertebral body measures 1.7 cm on 58/2. CT ABDOMEN PELVIS FINDINGS Hepatobiliary: Normal liver. Normal gallbladder, without biliary ductal dilatation. Pancreas: Pancreatic atrophy, without duct dilatation or acute inflammation. Spleen: Normal in size, without focal abnormality. Adrenals/Urinary Tract: Normal left adrenal gland. Right adrenal thickening and nodularity, suspicious. Too small to characterize left renal lesions and renal sinus cysts. Infiltrative mass involving the inter and lower pole of the right kidney, including at 13.0 x 8.1 cm on transverse image 76/2. On the order of 10.0 cm on coronal image 87. More cephalad altered renal morphology may be due to tumor involvement or renal vein  compression/involvement. Lack of right renal contrast excretion. No left-sided hydronephrosis. Normal urinary bladder. Stomach/Bowel: Normal remainder of the stomach. Extensive colonic diverticulosis. Normal terminal ileum and appendix. Normal small bowel. Vascular/Lymphatic: Normal aortic caliber.  Aortic atherosclerosis. Nodal metastasis versus direct tumor involvement surrounding the IVC and left renal vein, including on images 65 and 66 of series 2. Due to bolus timing, these vessels are suboptimally evaluated. No tumor extension more superiorly. Abdominal retroperitoneal adenopathy including a preaortic node of 1.6 cm on 78/2. No pelvic sidewall adenopathy. Reproductive: Normal prostate. Other: No significant free fluid. Small bilateral fat containing inguinal hernias. A fat containing periumbilical ventral abdominal wall hernia. Musculoskeletal: No acute osseous abnormality. IMPRESSION: 1. Infiltrative renal cell carcinoma with abdominal nodal and osseous metastasis. 2. Tumor and/or adenopathy surrounding the right renal vein and IVC. Suboptimal  evaluation for direct vascular involvement (which is suspected) secondary to bolus timing. No extension of thrombus or tumor into the more superior portion of the IVC. 3. Pulmonary nodularity, most likely due to metastatic disease. Without priors for comparison, some of the ill-defined nodules are indeterminate and atypical infection would be a possibility. 4. Borderline mediastinal adenopathy, indeterminate. 5. Small left pleural effusion with adjacent left lower lobe atelectasis. 6. Coronary artery atherosclerosis. Aortic Atherosclerosis (ICD10-I70.0). 7. A right chest wall subcutaneous lesion is most likely a sebaceous cyst but could be correlated with physical exam. Electronically Signed   By: Abigail Miyamoto M.D.   On: 03/18/2020 15:15     EKG: Independently reviewed.  Sinus rhythm, QTC 469, unstable baseline, poor quality of EKG  strips   Assessment/Plan Principal Problem:   Severe sepsis (HCC) Active Problems:   Acute metabolic encephalopathy   Renal cancer (HCC)   Diabetes mellitus without complication (HCC)   Hypertension   UTI (urinary tract infection)   GERD (gastroesophageal reflux disease)   Elevated troponin   Hypothyroidism   Severe sepsis vs. SIRS ([place]): Patient meets criteria for severe sepsis with fever, tachycardia with heart rate 118, tachypnea with RR 22.  Lactic acid is elevated 3.6.  Currently hemodynamically stable.  Source of infection is not clear.  Urinalysis showed WBC 11-20 with cloudy appearance, but no leukocyte and bacteria.  Patient has increased urinary frequency recently, indicating possible UTI.  Patient is on oral chemotherapy and is immunosuppressed.  Will start antibiotics with broad coverage.  -Admitted to MedS[facility] bed as inpatient -Vancomycin and cefepime -F/u on culture urine culture -will get Procalcitonin and trend lactic acid levels per sepsis protocol. -IVF: 3L of LR bolus in ED, followed by 75 cc/h   Possible UTI -See above  Acute metabolic encephalopathy: Likely due to sepsis.  CT of head did not show new acute findings. -Frequent neuro check  Renal cancer [facility]): -Hold oral chemotherapy medications due to severe sepsis -Continue Flomax -Follow-up with [facility]  Diabetes mellitus without complication [facility]): Recent A1c 9.5, poorly controlled.  Patient is taking Metformin -Sliding scale insulin  Hypertension -IV hydralazine as needed -Amlodipine  Hypothyroidism -Synthroid  GERD (gastroesophageal reflux disease) -IV Pepcid  Elevated troponin: Troponin 42.  No chest pain per his friend.  Likely demand ischemia -Trend troponin -Check A1c, FLP -Repeat EKG in morning -Will not start aspirin since patient has hemorrhagic change of metastasized to brain disease   DVT ppx: SCD Code Status: DNR his POA Family Communication: Yes, patient's friend who  is POA at bed side Disposition Plan:  Anticipate discharge back to previous environment Consults called:  none Admission status: Med-surg bed as inpt      Status is: Inpatient  Remains inpatient appropriate because:Inpatient level of care appropriate due to severity of illness.  Patient has multiple comorbidities, including advanced renal cancer on chemotherapy, immunosuppressed.  Presents with severe sepsis with unclear source of infection.  Patient also has AKI, metabolic encephalopathy, elevated troponin.  His presentation is highly complicated.  Patient is at high risk of deteriorating.  Will need to be treated in hospital for at least 2 days   Dispo: The patient is from: Home              Anticipated d/c is to: Home              Anticipated d/c date is: 2 days              Patient currently is  not medically stable to d/c.             Date of Service 03/18/2020    Ivor Costa [facility]   If 7PM-7AM, please contact night-coverage [email or web address] 03/18/2020, 5:26 PM

## 2020-03-18 NOTE — ED Provider Notes (Addendum)
[facility] Emergency Department Provider Note ____________________________________________   First MD Initiated Contact with Patient 03/18/20 1043     (approximate)  I have reviewed the triage vital signs and the nursing notes.  HISTORY  Chief Complaint Altered Mental Status   HPI Hunter Davila is a 64 y.o. malewho presents to the ED for evaluation of altered mentation.   Chart review indicates history of advanced renal cell carcinoma followed by [facility].  Known metastases throughout the abdomen and to the brain.  He is s/p radiation therapy and is currently on chemotherapy with lenvatinib and everolimus.  Previously on a higher dose, but partner reports encephalopathic reaction so dose was lowered about 1 month ago. Otherwise history of HTN, BPH, HLD and DM on oral agents.    Patient is unable to provide any history due to his altered mental status.  Hunter Davila, and medical POA provides all history in conjunction with chart review.  She reports seeing him daily and caring for him day-to-day.  She reports that he seemed "out of it" yesterday, but nothing focal.  She reports going out this morning for a few errands, and when she returned later in the morning she found him "talking in circles" and his home "looked like he had fallen everywhere."  She indicates that the house was a mess and that there were multiple things overturned, indicative of patient stumbling around and knocking things over.   Past Medical History:  Diagnosis Date  . Cancer ([place])   . Diabetes mellitus without complication ([place])   . Hypertension   . Renal disorder    Renal cancer    There are no problems to display for this patient.   History reviewed. No pertinent surgical history.  Prior to Admission medications   Medication Sig Start Date End Date Taking? Authorizing Provider  amLODipine (NORVASC) 10 MG tablet Take 10 mg by mouth daily. 02/27/20  Yes [provider]  everolimus (AFINITOR) 5 MG tablet Take 5 mg by mouth at bedtime. 03/16/20  Yes [provider]  lenvatinib 14 mg daily dose (LENVIMA) 10 & 4 MG capsule Take 14 mg by mouth daily.   Yes [provider]  levothyroxine (SYNTHROID) 125 MCG tablet Take 125 mcg by mouth daily. 03/17/20  Yes [provider]  metFORMIN (GLUCOPHAGE) 1000 MG tablet Take 1,000 mg by mouth 2 (two) times daily. 02/26/20  Yes [provider]  oxyCODONE (OXY IR/ROXICODONE) 5 MG immediate release tablet Take 5 mg by mouth every 4 (four) hours as needed for pain. 03/05/20  Yes [provider]  potassium chloride SA (KLOR-CON) 20 MEQ tablet Take 20 mEq by mouth 2 (two) times daily. 03/12/20  Yes [provider]  senna (SENOKOT) 8.6 MG tablet Take 1 tablet by mouth daily. 11/28/19 11/27/20 Yes [provider]  tamsulosin (FLOMAX) 0.4 MG CAPS capsule Take 0.4 mg by mouth daily. Take 30 minutes after same meal each day 03/17/20  Yes [provider]  ketoconazole (NIZORAL) 2 % shampoo Apply 1 application topically 3 (three) times a week. Apply 1-3 times a week on scalp. Let sit 10 minutes then rinse out 12/11/19   [provider]    Allergies Patient has no known allergies.  No family history on file.  Social History Social History   Tobacco Use  . Smoking status: Never Smoker  . Smokeless tobacco: Never Used  Substance Use Topics  . Alcohol use: Not Currently  . Drug use:  Not on file    Review of Systems  Unable to be assessed due to patient's altered mental status. ____________________________________________   PHYSICAL EXAM:  VITAL SIGNS: Vitals:   03/18/20 1127 03/18/20 1159  BP: (!) 184/92 (!) 184/97  Pulse: (!) 111 (!) 102  Resp: (!) 21 (!) 22  Temp:    SpO2: 94% 96%      Constitutional: Alert and disoriented.  Patient will lock eyes with me when I say his name, but does not follow commands.  Frequently shifting in bed  and agitated in a nonviolent manner.  Does not say much. Eyes: Conjunctivae are normal. PERRL. EOMI. Head: Atraumatic. Nose: No congestion/rhinnorhea. Mouth/Throat: Mucous membranes are dry.  Oropharynx non-erythematous. Neck: No stridor. No cervical spine tenderness to palpation. Cardiovascular: Tachycardic rate, regular rhythm. Grossly normal heart sounds.  Good peripheral circulation. Respiratory: Normal respiratory effort.  No retractions. Lungs CTAB. Gastrointestinal: Soft , nondistended. . Musculoskeletal: No lower extremity tenderness nor edema.  No joint effusions. No signs of acute trauma to the extremities.  No spinal step-offs throughout. Neurologic: Patient does not follow commands.  No focal deficits, as he moves around in bed he uses all 4 extremities to roll himself. No clonus to bilateral ankles.  No hyperreflexia noted. Skin:  Skin is warm, dry and intact. No rash noted. Psychiatric: Mood and affect are normal. Speech and behavior are normal.  ____________________________________________   LABS (all labs ordered are listed, but only abnormal results are displayed)  Labs Reviewed  LACTIC ACID, PLASMA - Abnormal; Notable for the following components:      Result Value   Lactic Acid, Venous 3.6 (*)    All other components within normal limits  CBC WITH DIFFERENTIAL/PLATELET - Abnormal; Notable for the following components:   RBC 3.81 (*)    Hemoglobin 8.6 (*)    HCT 27.8 (*)    MCV 73.0 (*)    MCH 22.6 (*)    RDW 17.5 (*)    Lymphs Abs 0.4 (*)    All other components within normal limits  COMPREHENSIVE METABOLIC PANEL - Abnormal; Notable for the following components:   Chloride 97 (*)    Glucose, Bld 172 (*)    Creatinine, Ser 1.45 (*)    Albumin 3.1 (*)    GFR calc non Af Amer 51 (*)    GFR calc Af Amer 59 (*)    Anion gap 17 (*)    All other components within normal limits  TROPONIN I (HIGH SENSITIVITY) - Abnormal; Notable for the following components:    Troponin I (High Sensitivity) 42 (*)    All other components within normal limits  SARS CORONAVIRUS 2 BY RT PCR (HOSPITAL ORDER, [facility])  CULTURE, BLOOD (ROUTINE X 2)  CULTURE, BLOOD (ROUTINE X 2)  AMMONIA  MAGNESIUM  URINALYSIS, COMPLETE (UACMP) WITH MICROSCOPIC  TROPONIN I (HIGH SENSITIVITY)   ____________________________________________  12 Lead EKG  Sinus rhythm, rate of 117 bpm, normal axis and intervals.  T wave inversions isolated to lead 6 and inferior leads.  No reciprocal ST elevations or evidence of ACS. ____________________________________________  RADIOLOGY  ED MD interpretation: CT head reviewed without evidence of acute intracranial pathology.  CT chest abdomen pelvis pending at the time of signout.  Official radiology report(s): CT Head Wo Contrast  Result Date: 03/18/2020 CLINICAL DATA:  Mental status change.  Renal cell carcinoma EXAM: CT HEAD WITHOUT CONTRAST TECHNIQUE: Contiguous axial images were obtained from the base of the skull  through the vertex without intravenous contrast. COMPARISON:  MRI head 08/27/2019 in CT head 08/27/2019 FINDINGS: Brain: Generalized atrophy with mild ventricular enlargement appear stable. Negative for acute infarct 5 mm subtle hyperdensity in the left temporal lobe lateral to the atria. This is unchanged from prior studies and likely is an area of chronic hemorrhage. This was also identified on MRI. No area of acute hemorrhage or mass. Vascular: Negative for hyperdense vessel Skull: Negative Sinuses/Orbits: Paranasal sinuses clear.  Negative orbit Other: None IMPRESSION: No acute abnormality Moderate atrophy, stable 5 mm hyperdensity left posterior temporal lobe is unchanged from prior studies and compatible with chronic hemorrhage. Electronically Signed   By: Franchot Gallo M.D.   On: 03/18/2020 14:54    ____________________________________________   PROCEDURES and INTERVENTIONS  Procedure(s)  performed (including Critical Care):  .1-3 Lead EKG Interpretation Performed by: Vladimir Crofts, MD Authorized by: Vladimir Crofts, MD     Interpretation: abnormal     ECG rate:  110   ECG rate assessment: tachycardic     Rhythm: sinus tachycardia     Ectopy: none     Conduction: normal   .Critical Care Performed by: Vladimir Crofts, MD Authorized by: Vladimir Crofts, MD   Critical care provider statement:    Critical care time (minutes):  45   Critical care was necessary to treat or prevent imminent or life-threatening deterioration of the following conditions:  CNS failure or compromise and dehydration   Critical care was time spent personally by me on the following activities:  Discussions with consultants, evaluation of patient's response to treatment, examination of patient, ordering and performing treatments and interventions, ordering and review of laboratory studies, ordering and review of radiographic studies, pulse oximetry, re-evaluation of patient's condition, obtaining history from patient or surrogate and review of old charts    Medications  haloperidol lactate (HALDOL) injection 3 mg (has no administration in time range)  ceFEPIme (MAXIPIME) 2 g in sodium chloride 0.9 % 100 mL IVPB (2 g Intravenous New Bag/Given 03/18/20 1501)  lactated ringers bolus 1,000 mL (1,000 mLs Intravenous New Bag/Given 03/18/20 1150)  midazolam (VERSED) injection 2 mg (2 mg Intramuscular Given by Other 03/18/20 1105)  iohexol (OMNIPAQUE) 300 MG/ML solution 100 mL (100 mLs Intravenous Contrast Given 03/18/20 1251)  LORazepam (ATIVAN) injection 1 mg (1 mg Intravenous Given 03/18/20 1308)  haloperidol lactate (HALDOL) injection 2 mg (2 mg Intravenous Given 03/18/20 1339)    ____________________________________________   MDM / ED COURSE  64 year old male with widely metastatic renal cell carcinoma who presents to the ED for evaluation of altered mental status and delirium, likely a metabolic encephalopathy  in etiology requiring medical admission.  Patient presented with low-grade temperature and sinus tachycardia, though hemodynamically stable not hypoxic on room air.  Exam demonstrates a nonfocal neuro exam and no evidence of acute trauma.  Patient does not appear to be in distress and is not violent, though is frequently shifting in bed and mildly persistently agitated requiring multiple doses of anxiolytic medications to facilitate interventions.  Blood work with AKI and lactic acidosis, for which he received bolus of lactated Ringer's.  Blood cultures were drawn and patient was provided dose of cefepime.  CT imaging required multiple trips to CT and multiple doses of calming agents to facilitate patient sitting still to get the studies.  CT head obtained due to possibility of hemorrhagic conversion of his known intracranial lesions, and CT head without evidence of such.  CT chest abdomen pelvis pending at time of signout  to oncoming provider, Dr. Quentin Cornwall, to follow-up on this and urinalysis.  Suspect patient will require medical admission here regardless of these results for further work-up and assessment of his altered mental status.  Clinical Course as of Mar 19 1511  Tue Mar 18, 2020  1102 Patient persistently agitated and restless in bed.  Not violent or aggressive, but not allowing nurses to draw blood replaced IV.  Provided 2 mg of IM Versed   [DS]  1103 Friend, Hunter Davila, at the bedside provides most history   [DS]  1115 Reassessed.  Patient resting more comfortably in bed.  Hunter Davila at the bedside.  Nurse temporarily drawn away due to high acuity patient nearby, blood work still needs to be collected and IV placed.   [DS]  9480 While patient has been calm since Versed administration and was when I reassessed about half an hour ago, RN informs me that they were unable to acquire CT imaging due to patient becoming agitated within the CT scanner. 1mg  IV ativan ordered    [DS]  1337 Return to the  bedside.  Patient continues to be calm and cooperative for me.  With loud vocal or noxious stimulation, he does start to uncover himself and pull out his IVs.  Ordered additional Haldol on top of Ativan to facilitate CT imaging.  Broached the topic with Mateo Flow at the bedside, who educates me that she is medical power of attorney for the patient, of the possibility that if he does not sit still for this next round of CT imaging that we may have to intubate him to facilitate CT imaging due to the possibility of life-threatening acute pathology.   [DS]  1655 Patient finally in CT.  I observed acquisition of CT head, no midline shift or obvious pathology.  Will await remainder of CT imaging and radiology reads to evaluate admit versus transfer, based on the extent and quality of his pathology   [DS]    Clinical Course User Index [DS] Vladimir Crofts, MD   ____________________________________________   FINAL CLINICAL IMPRESSION(S) / ED DIAGNOSES  Final diagnoses:  Altered behavior  Delirium  Fever, unspecified fever cause  Sinus tachycardia  Lactic acidosis     ED Discharge Orders    None      Hunter Davila   Note:  This document was prepared using Set designer software and may include unintentional dictation errors.   Vladimir Crofts, MD 03/18/20 [place], MD 03/18/20 1517

## 2020-03-19 DIAGNOSIS — E872 Acidosis: Secondary | ICD-10-CM

## 2020-03-19 DIAGNOSIS — R6510 Systemic inflammatory response syndrome (SIRS) of non-infectious origin without acute organ dysfunction: Secondary | ICD-10-CM

## 2020-03-19 DIAGNOSIS — E039 Hypothyroidism, unspecified: Secondary | ICD-10-CM

## 2020-03-19 LAB — BASIC METABOLIC PANEL
Anion gap: 15 (ref 5–15)
BUN: 20 mg/dL (ref 8–23)
CO2: 21 mmol/L — ABNORMAL LOW (ref 22–32)
Calcium: 9.1 mg/dL (ref 8.9–10.3)
Chloride: 100 mmol/L (ref 98–111)
Creatinine, Ser: 1.23 mg/dL (ref 0.61–1.24)
GFR calc Af Amer: >60 mL/min (ref >60)
GFR calc non Af Amer: >60 mL/min (ref >60)
Glucose, Bld: 172 mg/dL — ABNORMAL HIGH (ref 70–99)
Potassium: 3.7 mmol/L (ref 3.5–5.1)
Sodium: 136 mmol/L (ref 135–145)

## 2020-03-19 LAB — LIPID PANEL
Cholesterol: 258 mg/dL — ABNORMAL HIGH (ref 0–200)
HDL: 53 mg/dL (ref >40)
LDL Cholesterol: 168 mg/dL — ABNORMAL HIGH (ref 0–99)
Total CHOL/HDL Ratio: 4.9 RATIO
Triglycerides: 183 mg/dL — ABNORMAL HIGH (ref <150)
VLDL: 37 mg/dL (ref 0–40)

## 2020-03-19 LAB — MRSA PCR SCREENING: MRSA by PCR: NEGATIVE

## 2020-03-19 LAB — GLUCOSE, CAPILLARY
Glucose-Capillary: 182 mg/dL — ABNORMAL HIGH (ref 70–99)
Glucose-Capillary: 150 mg/dL — ABNORMAL HIGH (ref 70–99)
Glucose-Capillary: 136 mg/dL — ABNORMAL HIGH (ref 70–99)
Glucose-Capillary: 148 mg/dL — ABNORMAL HIGH (ref 70–99)
Glucose-Capillary: 159 mg/dL — ABNORMAL HIGH (ref 70–99)

## 2020-03-19 LAB — URINE CULTURE: Culture: NO GROWTH

## 2020-03-19 LAB — CBC
HCT: 32.3 % — ABNORMAL LOW (ref 39.0–52.0)
Hemoglobin: 9.6 g/dL — ABNORMAL LOW (ref 13.0–17.0)
MCH: 22.2 pg — ABNORMAL LOW (ref 26.0–34.0)
MCHC: 29.7 g/dL — ABNORMAL LOW (ref 30.0–36.0)
MCV: 74.6 fL — ABNORMAL LOW (ref 80.0–100.0)
Platelets: 261 10*3/uL (ref 150–400)
RBC: 4.33 MIL/uL (ref 4.22–5.81)
RDW: 18.3 % — ABNORMAL HIGH (ref 11.5–15.5)
WBC: 6.6 10*3/uL (ref 4.0–10.5)
nRBC: 0 % (ref 0.0–0.2)

## 2020-03-19 LAB — HEMOGLOBIN A1C
Hgb A1c MFr Bld: 7.3 % — ABNORMAL HIGH (ref 4.8–5.6)
Mean Plasma Glucose: 162.81 mg/dL

## 2020-03-19 LAB — TROPONIN I (HIGH SENSITIVITY): Troponin I (High Sensitivity): 43 ng/L — ABNORMAL HIGH (ref <18)

## 2020-03-19 LAB — LACTIC ACID, PLASMA: Lactic Acid, Venous: 1.4 mmol/L (ref 0.5–1.9)

## 2020-03-19 LAB — HIV ANTIBODY (ROUTINE TESTING W REFLEX): HIV Screen 4th Generation wRfx: NONREACTIVE

## 2020-03-19 MED ORDER — ENOXAPARIN SODIUM 40 MG/0.4ML ~~LOC~~ SOLN
40.0000 mg | SUBCUTANEOUS | Status: DC
  Administered 2020-03-19: 40 mg via SUBCUTANEOUS
  Filled 2020-03-19: qty 0.4

## 2020-03-19 NOTE — Progress Notes (Signed)
PROGRESS NOTE  Hunter Davila WGY:[id number] DOB: 1956-04-08 DOA: 03/18/2020 PCP: Patient, No Pcp Per  HPI/Recap of past 41 hours: 64 year old male with past medical history of metastatic renal cancer, hypertension, diabetes mellitus and hypothyroidism who was admitted to the hospitalist service on 9/14, after presenting to the emergency room with confusion.  The patient has been taking systemic chemotherapy including lenvatinib and everolimus and initially had been on high dose of lapatinib, but high doses were causing confusion so his dose was decreased from 18 down to 10 mg and recently increased back to 14 mg.  According to patient's friend, he has been confused since the previous day and she found him talking in circles.  Patient also noted to have a low-grade fever of 100.5 as well as tachycardia and tachypnea.  No evidence of acute infection noted as he had a normal white count, no pneumonia on x-ray, no hypoxia and relatively normal urinalysis.  His lactic acid was elevated at 3.6 on admission and he was noted to have acute kidney injury with a creatinine of 1.45.  Patient brought in, started on blood cultures and given fluids and antibiotics.  Lactic acid has since normalized and creatinine continues to improve, down to 1.23.  Minimally elevated at 0.53.  Patient himself as of this morning appears to be alert and oriented x4 with no complaints denies any pain or shortness of breath or recent injuries.  He says his last bowel movement was 2 days ago.  Assessment/Plan: Principal Problem: Acute metabolic encephalopathy: Unclear, suspect may have been in part due to dehydration including acute kidney injury as well as recent increase in his chemotherapy.  Is also possible that this could be due to infection, see below. Active Problems: SIRS: Certainly concerning for infection given elevated lactic acid level although this could be from dehydration plus tachycardia and tachypnea.  Blood  cultures are pending, but no source of infection is found.  Given that we do not have a confirmed source, this would not qualify as sepsis therefore sepsis is ruled out at this time.    Renal cancer ([place]): Continue Flomax.  Holding chemotherapy medications until infection has been ruled out.    Diabetes mellitus without complication ([place]): Following blood sugars, patient allowed to eat.    Hypertension: As needed hydralazine.    GERD (gastroesophageal reflux disease)   Elevated troponin   Hypothyroidism: Continue Synthroid   Code Status: Full code  Family Communication: Left message with patient's friend  Disposition Plan: Potential discharge in next 1 to 2 days once we have confirmed whether he has an infection and to ensure that he has no further episodes.   Consultants:  None  Procedures:  None  Antimicrobials:  IV cefepime 9/14-present  IV vancomycin 9/14-9/15  DVT prophylaxis: SCDs   Objective: Vitals:   03/19/20 1030 03/19/20 1100  BP:  (!) 151/88  Pulse: 99   Resp: 19   Temp:    SpO2: 96%     Intake/Output Summary (Last 24 hours) at 03/19/2020 1417 Last data filed at 03/19/2020 1227 Gross per 24 hour  Intake 1151.1 ml  Output --  Net 1151.1 ml   Filed Weights   03/18/20 1041  Weight: 99 kg   Body mass index is 28.02 kg/m.  Exam:   General: Alert and oriented x3, no acute distress  HEENT: Normocephalic and atraumatic, mucous membranes are slightly dry  Cardiovascular: Regular rate and rhythm, S1-S2, borderline tachycardia  Respiratory: Clear to auscultation bilaterally  Abdomen:  Soft, nontender, nondistended, positive bowel sounds  Musculoskeletal: No clubbing or cyanosis or edema  Skin: No skin breaks, tears or lesions  Psychiatry: Appropriate, no evidence of psychoses  Neuro: No focal deficits   Data Reviewed: CBC: Recent Labs  Lab 03/18/20 1149 03/18/20 1730 03/19/20 0406  WBC 7.0 6.0 6.6  NEUTROABS 6.1 4.7  --   HGB  8.6* 8.4* 9.6*  HCT 27.8* 27.6* 32.3*  MCV 73.0* 73.0* 74.6*  PLT 279 246 476   Basic Metabolic Panel: Recent Labs  Lab 03/18/20 1149 03/19/20 0406  NA 136 136  K 3.9 3.7  CL 97* 100  CO2 22 21*  GLUCOSE 172* 172*  BUN 21 20  CREATININE 1.45* 1.23  CALCIUM 9.4 9.1  MG 1.7  --    GFR: Estimated Creatinine Clearance: 78.3 mL/min (by C-G formula based on SCr of 1.23 mg/dL). Liver Function Tests: Recent Labs  Lab 03/18/20 1149  AST 19  ALT 10  ALKPHOS 89  BILITOT 0.5  PROT 8.0  ALBUMIN 3.1*   No results for input(s): LIPASE, AMYLASE in the last 168 hours. Recent Labs  Lab 03/18/20 1149  AMMONIA 15   Coagulation Profile: No results for input(s): INR, PROTIME in the last 168 hours. Cardiac Enzymes: No results for input(s): CKTOTAL, CKMB, CKMBINDEX, TROPONINI in the last 168 hours. BNP (last 3 results) No results for input(s): PROBNP in the last 8760 hours. HbA1C: No results for input(s): HGBA1C in the last 72 hours. CBG: Recent Labs  Lab 03/18/20 1812 03/19/20 0244 03/19/20 0840 03/19/20 1327  GLUCAP 149* 182* 150* 136*   Lipid Profile: Recent Labs    03/19/20 0406  CHOL 258*  HDL 53  LDLCALC 168*  TRIG 183*  CHOLHDL 4.9   Thyroid Function Tests: No results for input(s): TSH, T4TOTAL, FREET4, T3FREE, THYROIDAB in the last 72 hours. Anemia Panel: No results for input(s): VITAMINB12, FOLATE, FERRITIN, TIBC, IRON, RETICCTPCT in the last 72 hours. Urine analysis:    Component Value Date/Time   COLORURINE YELLOW (A) 03/18/2020 1044   APPEARANCEUR CLOUDY (A) 03/18/2020 1044   LABSPEC 1.023 03/18/2020 1044   PHURINE 5.0 03/18/2020 1044   GLUCOSEU 50 (A) 03/18/2020 1044   HGBUR MODERATE (A) 03/18/2020 1044   BILIRUBINUR NEGATIVE 03/18/2020 1044   KETONESUR NEGATIVE 03/18/2020 1044   PROTEINUR 100 (A) 03/18/2020 1044   NITRITE NEGATIVE 03/18/2020 1044   LEUKOCYTESUR NEGATIVE 03/18/2020 1044   Sepsis  Labs: @LABRCNTIP (procalcitonin:4,lacticidven:4)  ) Recent Results (from the past 240 hour(s))  Blood culture (routine x 2)     Status: None (Preliminary result)   Collection Time: 03/18/20 11:37 AM   Specimen: BLOOD  Result Value Ref Range Status   Specimen Description BLOOD  R HAND  Final   Special Requests   Final    BOTTLES DRAWN AEROBIC AND ANAEROBIC Blood Culture results may not be optimal due to an inadequate volume of blood received in culture bottles   Culture   Final    NO GROWTH < 24 HOURS Performed at [facility], [address] [place], [place] [address]    Report Status PENDING  Incomplete  Blood culture (routine x 2)     Status: None (Preliminary result)   Collection Time: 03/18/20 11:49 AM   Specimen: BLOOD  Result Value Ref Range Status   Specimen Description BLOOD R AC  Final   Special Requests   Final    BOTTLES DRAWN AEROBIC AND ANAEROBIC Blood Culture adequate volume   Culture  Final    NO GROWTH < 24 HOURS Performed at [facility], [address] [place], [place] [address]    Report Status PENDING  Incomplete  SARS Coronavirus 2 by RT PCR (hospital order, performed in [facility] hospital lab) Nasopharyngeal Nasopharyngeal Swab     Status: None   Collection Time: 03/18/20 11:49 AM   Specimen: Nasopharyngeal Swab  Result Value Ref Range Status   SARS Coronavirus 2 NEGATIVE NEGATIVE Final    Comment: (NOTE) SARS-CoV-2 target nucleic acids are NOT DETECTED.  The SARS-CoV-2 RNA is generally detectable in upper and lower respiratory specimens during the acute phase of infection. The lowest concentration of SARS-CoV-2 viral copies this assay can detect is 250 copies / mL. A negative result does not preclude SARS-CoV-2 infection and should not be used as the sole basis for treatment or other patient management decisions.  A negative result may occur with improper specimen collection / handling, submission of specimen other than  nasopharyngeal swab, presence of viral mutation(s) within the areas targeted by this assay, and inadequate number of viral copies (<250 copies / mL). A negative result must be combined with clinical observations, patient history, and epidemiological information.  Fact Sheet for Patients:   StrictlyIdeas.no  Fact Sheet for Healthcare Providers: BankingDealers.co.za  This test is not yet approved or  cleared by the [place] FDA and has been authorized for detection and/or diagnosis of SARS-CoV-2 by FDA under an Emergency Use Authorization (EUA).  This EUA will remain in effect (meaning this test can be used) for the duration of the COVID-19 declaration under Section 564(b)(1) of the Act, 21 U.S.C. section 360bbb-3(b)(1), unless the authorization is terminated or revoked sooner.  Performed at [facility], [address], [place], [place] [address]       Studies: CT Head Wo Contrast  Result Date: 03/18/2020 CLINICAL DATA:  Mental status change.  Renal cell carcinoma EXAM: CT HEAD WITHOUT CONTRAST TECHNIQUE: Contiguous axial images were obtained from the base of the skull through the vertex without intravenous contrast. COMPARISON:  MRI head 08/27/2019 in CT head 08/27/2019 FINDINGS: Brain: Generalized atrophy with mild ventricular enlargement appear stable. Negative for acute infarct 5 mm subtle hyperdensity in the left temporal lobe lateral to the atria. This is unchanged from prior studies and likely is an area of chronic hemorrhage. This was also identified on MRI. No area of acute hemorrhage or mass. Vascular: Negative for hyperdense vessel Skull: Negative Sinuses/Orbits: Paranasal sinuses clear.  Negative orbit Other: None IMPRESSION: No acute abnormality Moderate atrophy, stable 5 mm hyperdensity left posterior temporal lobe is unchanged from prior studies and compatible with chronic hemorrhage. Electronically Signed   By:  Franchot Gallo M.D.   On: 03/18/2020 14:54   CT CHEST ABDOMEN PELVIS W CONTRAST  Result Date: 03/18/2020 CLINICAL DATA:  Abdominal pain. Known metastatic renal cell carcinoma. Febrile. Evaluate for progression of disease and infection. EXAM: CT CHEST, ABDOMEN, AND PELVIS WITH CONTRAST TECHNIQUE: Multidetector CT imaging of the chest, abdomen and pelvis was performed following the standard protocol during bolus administration of intravenous contrast. CONTRAST:  135mL OMNIPAQUE IOHEXOL 300 MG/ML  SOLN COMPARISON:  None. FINDINGS: CT CHEST FINDINGS Cardiovascular: Aortic atherosclerosis. Borderline cardiomegaly, without pericardial effusion. Multivessel coronary artery atherosclerosis. No central pulmonary embolism, on this non-dedicated study. Mediastinum/Nodes: No supraclavicular adenopathy. Borderline right paratracheal adenopathy 1.0 cm on 18/2. No hilar adenopathy.  Tiny hiatal hernia. Lungs/Pleura: Trace left pleural fluid. Bilateral pulmonary nodules. Many of these are somewhat vague and  ill-defined. A relatively well-defined right lower lobe pulmonary nodule measures 8 mm on 90/5. A lingular 7 mm nodule on 58/5 is likely sub solid in morphology. Medial left upper lobe ground-glass nodule of 1.0 cm on 33/5. Left base subsegmental atelectasis. Musculoskeletal: Right chest wall subcutaneous nodule of 1.4 cm on 37/2. Lytic lesion within the right-side of the T12 vertebral body measures 1.7 cm on 58/2. CT ABDOMEN PELVIS FINDINGS Hepatobiliary: Normal liver. Normal gallbladder, without biliary ductal dilatation. Pancreas: Pancreatic atrophy, without duct dilatation or acute inflammation. Spleen: Normal in size, without focal abnormality. Adrenals/Urinary Tract: Normal left adrenal gland. Right adrenal thickening and nodularity, suspicious. Too small to characterize left renal lesions and renal sinus cysts. Infiltrative mass involving the inter and lower pole of the right kidney, including at 13.0 x 8.1 cm on  transverse image 76/2. On the order of 10.0 cm on coronal image 87. More cephalad altered renal morphology may be due to tumor involvement or renal vein compression/involvement. Lack of right renal contrast excretion. No left-sided hydronephrosis. Normal urinary bladder. Stomach/Bowel: Normal remainder of the stomach. Extensive colonic diverticulosis. Normal terminal ileum and appendix. Normal small bowel. Vascular/Lymphatic: Normal aortic caliber.  Aortic atherosclerosis. Nodal metastasis versus direct tumor involvement surrounding the IVC and left renal vein, including on images 65 and 66 of series 2. Due to bolus timing, these vessels are suboptimally evaluated. No tumor extension more superiorly. Abdominal retroperitoneal adenopathy including a preaortic node of 1.6 cm on 78/2. No pelvic sidewall adenopathy. Reproductive: Normal prostate. Other: No significant free fluid. Small bilateral fat containing inguinal hernias. A fat containing periumbilical ventral abdominal wall hernia. Musculoskeletal: No acute osseous abnormality. IMPRESSION: 1. Infiltrative renal cell carcinoma with abdominal nodal and osseous metastasis. 2. Tumor and/or adenopathy surrounding the right renal vein and IVC. Suboptimal evaluation for direct vascular involvement (which is suspected) secondary to bolus timing. No extension of thrombus or tumor into the more superior portion of the IVC. 3. Pulmonary nodularity, most likely due to metastatic disease. Without priors for comparison, some of the ill-defined nodules are indeterminate and atypical infection would be a possibility. 4. Borderline mediastinal adenopathy, indeterminate. 5. Small left pleural effusion with adjacent left lower lobe atelectasis. 6. Coronary artery atherosclerosis. Aortic Atherosclerosis (ICD10-I70.0). 7. A right chest wall subcutaneous lesion is most likely a sebaceous cyst but could be correlated with physical exam. Electronically Signed   By: Abigail Miyamoto M.D.    On: 03/18/2020 15:15    Scheduled Meds: . amLODipine  10 mg Oral Daily  . clobetasol cream   Topical BID  . insulin aspart  0-5 Units Subcutaneous QHS  . insulin aspart  0-9 Units Subcutaneous TID WC  . levothyroxine  125 mcg Oral Daily  . senna  1 tablet Oral Daily  . tamsulosin  0.4 mg Oral Daily    Continuous Infusions: . ceFEPime (MAXIPIME) IV 2 g (03/19/20 1349)  . famotidine (PEPCID) IV Stopped (03/19/20 1227)  . lactated ringers 75 mL/hr at 03/18/20 1900  . vancomycin       LOS: 1 day     Annita Brod, MD [facility]   03/19/2020, 2:17 PM

## 2020-03-19 NOTE — ED Notes (Signed)
This Rn assumed pt's care. Pt asleep/resting comfortably. NAD. Sitter at bedside. VSS

## 2020-03-19 NOTE — ED Notes (Signed)
Attempted to call report at this time 

## 2020-03-19 NOTE — ED Notes (Signed)
Pt abx not given at this time due to patient's hand IV going bad and patient bending arm too much to start fluids through [facility] IV. Pt given haldol through [facility], will attempt to place new IV in patient when able.

## 2020-03-19 NOTE — ED Notes (Addendum)
Pt's friend (POA) Valerie at bedside, brought food for pt.

## 2020-03-20 DIAGNOSIS — E118 Type 2 diabetes mellitus with unspecified complications: Secondary | ICD-10-CM

## 2020-03-20 LAB — GLUCOSE, CAPILLARY
Glucose-Capillary: 172 mg/dL — ABNORMAL HIGH (ref 70–99)
Glucose-Capillary: 161 mg/dL — ABNORMAL HIGH (ref 70–99)
Glucose-Capillary: 156 mg/dL — ABNORMAL HIGH (ref 70–99)

## 2020-03-20 LAB — BRAIN NATRIURETIC PEPTIDE: B Natriuretic Peptide: 23.8 pg/mL (ref 0.0–100.0)

## 2020-03-20 LAB — BASIC METABOLIC PANEL
Anion gap: 13 (ref 5–15)
BUN: 16 mg/dL (ref 8–23)
CO2: 23 mmol/L (ref 22–32)
Calcium: 8.6 mg/dL — ABNORMAL LOW (ref 8.9–10.3)
Chloride: 96 mmol/L — ABNORMAL LOW (ref 98–111)
Creatinine, Ser: 1.19 mg/dL (ref 0.61–1.24)
GFR calc Af Amer: >60 mL/min (ref >60)
GFR calc non Af Amer: >60 mL/min (ref >60)
Glucose, Bld: 194 mg/dL — ABNORMAL HIGH (ref 70–99)
Potassium: 3.6 mmol/L (ref 3.5–5.1)
Sodium: 132 mmol/L — ABNORMAL LOW (ref 135–145)

## 2020-03-20 LAB — CBC
HCT: 28 % — ABNORMAL LOW (ref 39.0–52.0)
Hemoglobin: 8.2 g/dL — ABNORMAL LOW (ref 13.0–17.0)
MCH: 22.2 pg — ABNORMAL LOW (ref 26.0–34.0)
MCHC: 29.3 g/dL — ABNORMAL LOW (ref 30.0–36.0)
MCV: 75.7 fL — ABNORMAL LOW (ref 80.0–100.0)
Platelets: 317 10*3/uL (ref 150–400)
RBC: 3.7 MIL/uL — ABNORMAL LOW (ref 4.22–5.81)
RDW: 18.3 % — ABNORMAL HIGH (ref 11.5–15.5)
WBC: 6.9 10*3/uL (ref 4.0–10.5)
nRBC: 0 % (ref 0.0–0.2)

## 2020-03-20 LAB — TROPONIN I (HIGH SENSITIVITY): Troponin I (High Sensitivity): 21 ng/L — ABNORMAL HIGH (ref <18)

## 2020-03-20 LAB — TSH: TSH: 19.927 u[IU]/mL — ABNORMAL HIGH (ref 0.350–4.500)

## 2020-03-20 LAB — T4, FREE: Free T4: 1.08 ng/dL (ref 0.61–1.12)

## 2020-03-20 LAB — PROCALCITONIN: Procalcitonin: 0.6 ng/mL

## 2020-03-20 MED ORDER — LEVOTHYROXINE SODIUM 150 MCG PO TABS: 150.0000 ug | ORAL_TABLET | Freq: Every day | ORAL | 1 refills | Status: AC

## 2020-03-20 MED ORDER — GLIPIZIDE 5 MG PO TABS: 5.0000 mg | ORAL_TABLET | Freq: Every day | ORAL | 11 refills | Status: AC

## 2020-03-20 MED ORDER — LENVATINIB (14 MG DAILY DOSE) 10 & 4 MG PO CPPK: 12.0000 mg | ORAL_CAPSULE | Freq: Every day | ORAL | 0 refills | Status: AC

## 2020-03-20 NOTE — TOC Initial Note (Signed)
Transition of Care [facility]) - Initial/Assessment Note    Patient Details  Name: Hunter Davila MRN: [id number] Date of Birth: [birth year]  Transition of Care [facility]) CM/SW Contact:    Shelbie Ammons, RN Phone Number: 03/20/2020, 10:36 AM  Clinical Narrative:    RNCM met with patient in room, patient sitting up in chair and reports to feeling some better. Patient noted to still have some periods of confusion. Patient reports that he lives alone and is fairly independent at home. He reports that he does not use any equipment and has never had home health services. RNCM discussed possibility of home health services at discharge for which patient is agreeable.   RNCM reached out to patient's friend/POA Mateo Flow. Mateo Flow reports that patient started having significant issues back in January and at that time he came to stay with she and her husband however over the next few months he improved to the point of being able to go back home. She reports that however over the last month or so he has began having more problems with taking care of himself, remembering to take his medications, etc. She feels that some short term home health at discharge would be of benefit.   RNCM reached out to [place] with [facility] for referral for home health.                Expected Discharge Plan: [place] Barriers to Discharge: Continued Medical Work up   Patient Goals and CMS Choice     Choice offered to / list presented to : Patient  Expected Discharge Plan and Services Expected Discharge Plan: [place]   Discharge Planning Services: CM Consult Post Acute Care Choice: [place] arrangements for the past 2 months: [place]: [place] ([place]) Date [place]: 03/20/20 Time [place]: 52 Representative spoke with at [place]: Corene Cornea  Prior Living  Arrangements/Services Living arrangements for the past 2 months: [place] with:: Self Patient language and need for interpreter reviewed:: Yes Do you feel safe going back to the place where you live?: Yes      Need for Family Participation in Patient Care: Yes (Comment) Care giver support system in place?: Yes (comment)   Criminal Activity/Legal Involvement Pertinent to Current Situation/Hospitalization: No - Comment as needed  Activities of Daily Living Home Assistive Devices/Equipment: None ADL Screening (condition at time of admission) Patient's cognitive ability adequate to safely complete daily activities?: Yes Is the patient deaf or have difficulty hearing?: No Does the patient have difficulty seeing, even when wearing glasses/contacts?: No Does the patient have difficulty concentrating, remembering, or making decisions?: Yes Patient able to express need for assistance with ADLs?: Yes Does the patient have difficulty dressing or bathing?: No Independently performs ADLs?: Yes (appropriate for developmental age) Does the patient have difficulty walking or climbing stairs?: No Weakness of Legs: None Weakness of Arms/Hands: None  Permission Sought/Granted                  Emotional Assessment Appearance:: Appears stated age       Alcohol / Substance Use: Not Applicable Psych Involvement: No (comment)  Admission diagnosis:  Sinus tachycardia [R00.0] Lactic acidosis [E87.2] Delirium [R41.0] Severe sepsis ([place]) [A41.9, R65.20] Fever, unspecified fever cause [  R50.9] Altered behavior [R46.89] Patient Active Problem List   Diagnosis Date Noted  . Acute metabolic encephalopathy 37/08/3015  . Renal cancer ([place]) 03/18/2020  . Hypothyroidism 03/18/2020  . Diabetes mellitus without complication ([place])   . Hypertension   . GERD (gastroesophageal reflux disease)   . SIRS (systemic inflammatory response syndrome) ([facility])   . Elevated troponin    PCP:   Patient, No Pcp Per Pharmacy:   [place], [place] - [address] [address] Phone: [phone] Fax: [phone]     Social Determinants of Health (SDOH) Interventions    Readmission Risk Interventions No flowsheet data found.

## 2020-03-20 NOTE — Progress Notes (Signed)
Pt admitted to unit this shift Alert and oriented, but has intermittent confusion and delayed response to commands/ questions.

## 2020-03-20 NOTE — Progress Notes (Addendum)
Patient is being discharged this afternoon to home with home health. DC & Rx instructions given to patient and Mateo Flow, his POA. Answered patient's questions and he acknowledged understanding. IV's removed. Belongings packed.

## 2020-03-20 NOTE — Discharge Summary (Signed)
Discharge Summary  Hunter Davila EAV:[id number] DOB: 02-10-1956  PCP: Patient, No Pcp Per  Admit date: 03/18/2020 Discharge date: 03/20/2020  Time spent: 45 minutes  Recommendations for Outpatient Follow-up:  1. Medication change: Synthroid increased to 150 mcg 2. Medication change: Lenvatinib decreased from 14 mg down to 12 mg 3. Medication change: Metformin discontinued. 4. New medication: Glipizide 5 mg p.o. daily 5. Patient will follow up with his PCP in 2 weeks.  Discharge Diagnoses:  Active Hospital Problems   Diagnosis Date Noted   SIRS (systemic inflammatory response syndrome) ([place])    Acute metabolic encephalopathy 78/29/5621   Renal cancer ([place]) 03/18/2020   Hypothyroidism 03/18/2020   Diabetes mellitus without complication (HCC)    Hypertension    GERD (gastroesophageal reflux disease)    Elevated troponin     Resolved Hospital Problems  No resolved problems to display.    Discharge Condition: Improved, being discharged home  Diet recommendation: Carb modified  Vitals:   03/20/20 0549 03/20/20 1003  BP: (!) 151/90 128/80  Pulse: 100 (!) 103  Resp: 16 16  Temp: 98.2 F (36.8 C) 98.2 F (36.8 C)  SpO2: 100% 100%    History of present illness:  64 year old male with past medical history of metastatic renal cancer, hypertension, diabetes mellitus and hypothyroidism who was admitted to the hospitalist service on 9/14, after presenting to the emergency room with confusion.  The patient has been taking systemic chemotherapy including lenvatinib and everolimus and initially had been on high dose of lapatinib, but high doses were causing confusion so his dose was decreased from 18 down to 10 mg and recently increased back to 14 mg.  According to patient's friend, he has been confused since the previous day and she found him talking in circles.  Patient also noted to have a low-grade fever of 100.5 as well as tachycardia and tachypnea.  No evidence of  acute infection noted as he had a normal white count, no pneumonia on x-ray, no hypoxia and relatively normal urinalysis.  His lactic acid was elevated at 3.6 on admission and he was noted to have acute kidney injury with a creatinine of 1.45.  Patient brought in, started on blood cultures and given fluids and antibiotics.   Hospital Course:  Principal Problem:   SIRS (systemic inflammatory response syndrome) ([place]): Likely secondary to dehydration and confusion.  No signs of infection.  See below.  Active Problems:   Acute metabolic encephalopathy: Now resolved.  As far as cause, etiologies would include hypothyroidism although this is less likely since he is not profoundly hypothyroid.  Have adjusted Synthroid.  See below.  Could also be lactic acidosis caused by Metformin.  Medication changed.  See below.  However, patient was on lenvatinib 18 mg which was decreased to 10 mg due to confusion.  It had been recently increased to 14 mg.  The fact that patient now presents with confusion makes this the most likely suspect.  Have advised the patient to decrease down to 12 mg.  He will follow-up with his oncologist in the next few weeks as scheduled.    Renal cancer ([place]) continue lenvatinib and Afinitor.  Dose of lenvatinib changed as above.  Patient will be following with his oncologist soon after discharge.    Diabetes mellitus, controlled with complication (HCC)/lactic acidosis: No evidence of infection.  He is been on cefepime and despite this, procalcitonin level still minimally elevated.  In review of literature, procalcitonin may not be helpful in patients on  chemo.  Did come in with elevated lactic acid level 3.6, but again, no evidence of infection.  Sepsis ruled out.  No UTI or pneumonia.  Most likely, lactic acidosis brought on by Metformin.  Have discontinued Metformin and started low-dose glipizide.    Hypertension: Blood pressure stable during his hospitalization.    GERD (gastroesophageal  reflux disease): Stable.    Elevated troponin: Minimally elevated.  No evidence of ACS.    Hypothyroidism: Recheck patient's TSH and found to be markedly elevated at 19.  Patient states he has been taking his Synthroid medication and I wonder if he may be moderately hypothyroid.  Increased his Synthroid from 125 mcg to 150 mcg.  Free T4 has been ordered and is pending.   Procedures:  None  Consultations:  None  Discharge Exam: BP 128/80 (BP Location: Left Arm)    Pulse (!) 103    Temp 98.2 F (36.8 C) (Oral)    Resp 16    Ht 6\' 2"  (1.88 m)    Wt 99 kg    SpO2 100%    BMI 28.02 kg/m   General: Alert and oriented x3, no acute distress Cardiovascular: Regular rate and rhythm, S1-S2, borderline tachycardia Respiratory: Clear to auscultation bilaterally  Discharge Instructions You were cared for by a hospitalist during your hospital stay. If you have any questions about your discharge medications or the care you received while you were in the hospital after you are discharged, you can call the unit and asked to speak with the hospitalist on call if the hospitalist that took care of you is not available. Once you are discharged, your primary care physician will handle any further medical issues. Please note that NO REFILLS for any discharge medications will be authorized once you are discharged, as it is imperative that you return to your primary care physician (or establish a relationship with a primary care physician if you do not have one) for your aftercare needs so that they can reassess your need for medications and monitor your lab values.  Discharge Instructions    Diet - low sodium heart healthy   Complete by: As directed    Increase activity slowly   Complete by: As directed      Allergies as of 03/20/2020   No Known Allergies     Medication List    STOP taking these medications   metFORMIN 1000 MG tablet Commonly known as: GLUCOPHAGE     TAKE these medications     amLODipine 10 MG tablet Commonly known as: NORVASC Take 10 mg by mouth daily.   clobetasol 0.05 % external solution Commonly known as: TEMOVATE Apply 1-2 times per day to the affected scalp areas as directed   diclofenac Sodium 1 % Gel Commonly known as: VOLTAREN Apply 2 g topically 4 (four) times daily.   everolimus 5 MG tablet Commonly known as: AFINITOR Take 5 mg by mouth at bedtime.   famotidine 20 MG tablet Commonly known as: PEPCID Take 20 mg by mouth 2 (two) times daily.   glipiZIDE 5 MG tablet Commonly known as: Glucotrol Take 1 tablet (5 mg total) by mouth daily.   ketoconazole 2 % shampoo Commonly known as: NIZORAL Apply 1 application topically 3 (three) times a week. Apply 1-3 times a week on scalp. Let sit 10 minutes then rinse out   lenvatinib 14 mg daily dose 10 & 4 MG capsule Commonly known as: LENVIMA Take 12 mg by mouth daily. Take 1/2 of a 4  mg and 1 x 10 mg capsules (12 mg dose) by mouth once daily What changed:   how much to take  how to take this  when to take this  additional instructions   levothyroxine 150 MCG tablet Commonly known as: SYNTHROID Take 1 tablet (150 mcg total) by mouth daily. Start taking on: March 21, 2020 What changed:   medication strength  how much to take   oxyCODONE 5 MG immediate release tablet Commonly known as: Oxy IR/ROXICODONE Take 5 mg by mouth every 4 (four) hours as needed for pain.   potassium chloride SA 20 MEQ tablet Commonly known as: KLOR-CON Take 20 mEq by mouth 2 (two) times daily.   senna 8.6 MG tablet Commonly known as: SENOKOT Take 1 tablet by mouth daily.   tamsulosin 0.4 MG Caps capsule Commonly known as: FLOMAX Take 0.4 mg by mouth daily. Take 30 minutes after same meal each day   triamcinolone cream 0.1 % Commonly known as: KENALOG Apply topically 2 (two) times daily Apply to affected area with rash ONLY      No Known Allergies    The results of significant diagnostics  from this hospitalization (including imaging, microbiology, ancillary and laboratory) are listed below for reference.    Significant Diagnostic Studies: CT Head Wo Contrast  Result Date: 03/18/2020 CLINICAL DATA:  Mental status change.  Renal cell carcinoma EXAM: CT HEAD WITHOUT CONTRAST TECHNIQUE: Contiguous axial images were obtained from the base of the skull through the vertex without intravenous contrast. COMPARISON:  MRI head 08/27/2019 in CT head 08/27/2019 FINDINGS: Brain: Generalized atrophy with mild ventricular enlargement appear stable. Negative for acute infarct 5 mm subtle hyperdensity in the left temporal lobe lateral to the atria. This is unchanged from prior studies and likely is an area of chronic hemorrhage. This was also identified on MRI. No area of acute hemorrhage or mass. Vascular: Negative for hyperdense vessel Skull: Negative Sinuses/Orbits: Paranasal sinuses clear.  Negative orbit Other: None IMPRESSION: No acute abnormality Moderate atrophy, stable 5 mm hyperdensity left posterior temporal lobe is unchanged from prior studies and compatible with chronic hemorrhage. Electronically Signed   By: Franchot Gallo M.D.   On: 03/18/2020 14:54   CT CHEST ABDOMEN PELVIS W CONTRAST  Result Date: 03/18/2020 CLINICAL DATA:  Abdominal pain. Known metastatic renal cell carcinoma. Febrile. Evaluate for progression of disease and infection. EXAM: CT CHEST, ABDOMEN, AND PELVIS WITH CONTRAST TECHNIQUE: Multidetector CT imaging of the chest, abdomen and pelvis was performed following the standard protocol during bolus administration of intravenous contrast. CONTRAST:  192mL OMNIPAQUE IOHEXOL 300 MG/ML  SOLN COMPARISON:  None. FINDINGS: CT CHEST FINDINGS Cardiovascular: Aortic atherosclerosis. Borderline cardiomegaly, without pericardial effusion. Multivessel coronary artery atherosclerosis. No central pulmonary embolism, on this non-dedicated study. Mediastinum/Nodes: No supraclavicular adenopathy.  Borderline right paratracheal adenopathy 1.0 cm on 18/2. No hilar adenopathy.  Tiny hiatal hernia. Lungs/Pleura: Trace left pleural fluid. Bilateral pulmonary nodules. Many of these are somewhat vague and ill-defined. A relatively well-defined right lower lobe pulmonary nodule measures 8 mm on 90/5. A lingular 7 mm nodule on 58/5 is likely sub solid in morphology. Medial left upper lobe ground-glass nodule of 1.0 cm on 33/5. Left base subsegmental atelectasis. Musculoskeletal: Right chest wall subcutaneous nodule of 1.4 cm on 37/2. Lytic lesion within the right-side of the T12 vertebral body measures 1.7 cm on 58/2. CT ABDOMEN PELVIS FINDINGS Hepatobiliary: Normal liver. Normal gallbladder, without biliary ductal dilatation. Pancreas: Pancreatic atrophy, without duct dilatation or acute inflammation. Spleen: Normal  in size, without focal abnormality. Adrenals/Urinary Tract: Normal left adrenal gland. Right adrenal thickening and nodularity, suspicious. Too small to characterize left renal lesions and renal sinus cysts. Infiltrative mass involving the inter and lower pole of the right kidney, including at 13.0 x 8.1 cm on transverse image 76/2. On the order of 10.0 cm on coronal image 87. More cephalad altered renal morphology may be due to tumor involvement or renal vein compression/involvement. Lack of right renal contrast excretion. No left-sided hydronephrosis. Normal urinary bladder. Stomach/Bowel: Normal remainder of the stomach. Extensive colonic diverticulosis. Normal terminal ileum and appendix. Normal small bowel. Vascular/Lymphatic: Normal aortic caliber.  Aortic atherosclerosis. Nodal metastasis versus direct tumor involvement surrounding the IVC and left renal vein, including on images 65 and 66 of series 2. Due to bolus timing, these vessels are suboptimally evaluated. No tumor extension more superiorly. Abdominal retroperitoneal adenopathy including a preaortic node of 1.6 cm on 78/2. No pelvic  sidewall adenopathy. Reproductive: Normal prostate. Other: No significant free fluid. Small bilateral fat containing inguinal hernias. A fat containing periumbilical ventral abdominal wall hernia. Musculoskeletal: No acute osseous abnormality. IMPRESSION: 1. Infiltrative renal cell carcinoma with abdominal nodal and osseous metastasis. 2. Tumor and/or adenopathy surrounding the right renal vein and IVC. Suboptimal evaluation for direct vascular involvement (which is suspected) secondary to bolus timing. No extension of thrombus or tumor into the more superior portion of the IVC. 3. Pulmonary nodularity, most likely due to metastatic disease. Without priors for comparison, some of the ill-defined nodules are indeterminate and atypical infection would be a possibility. 4. Borderline mediastinal adenopathy, indeterminate. 5. Small left pleural effusion with adjacent left lower lobe atelectasis. 6. Coronary artery atherosclerosis. Aortic Atherosclerosis (ICD10-I70.0). 7. A right chest wall subcutaneous lesion is most likely a sebaceous cyst but could be correlated with physical exam. Electronically Signed   By: Abigail Miyamoto M.D.   On: 03/18/2020 15:15    Microbiology: Recent Results (from the past 240 hour(s))  Urine Culture     Status: None   Collection Time: 03/18/20 10:44 AM   Specimen: Urine, Random  Result Value Ref Range Status   Specimen Description   Final    URINE, RANDOM Performed at [facility], [address], [place], [place] [address]    Special Requests   Final    NONE Performed at [facility], [address], [place], [place] [address]    Culture   Final    NO GROWTH Performed at [facility], [place] [address], [place], [place] [address]    Report Status 03/19/2020 FINAL  Final  Blood culture (routine x 2)     Status: None (Preliminary result)   Collection Time: 03/18/20 11:37 AM   Specimen: BLOOD  Result Value Ref Range Status   Specimen Description  BLOOD  R HAND  Final   Special Requests   Final    BOTTLES DRAWN AEROBIC AND ANAEROBIC Blood Culture results may not be optimal due to an inadequate volume of blood received in culture bottles   Culture   Final    NO GROWTH 2 DAYS Performed at [facility], [address] [place], [place] [address]    Report Status PENDING  Incomplete  Blood culture (routine x 2)     Status: None (Preliminary result)   Collection Time: 03/18/20 11:49 AM   Specimen: BLOOD  Result Value Ref Range Status   Specimen Description BLOOD R [facility]  Final   Special Requests   Final  BOTTLES DRAWN AEROBIC AND ANAEROBIC Blood Culture adequate volume   Culture   Final    NO GROWTH 2 DAYS Performed at [facility], [address] [place], [place] [address]    Report Status PENDING  Incomplete  SARS Coronavirus 2 by RT PCR (hospital order, performed in [facility] hospital lab) Nasopharyngeal Nasopharyngeal Swab     Status: None   Collection Time: 03/18/20 11:49 AM   Specimen: Nasopharyngeal Swab  Result Value Ref Range Status   SARS Coronavirus 2 NEGATIVE NEGATIVE Final    Comment: (NOTE) SARS-CoV-2 target nucleic acids are NOT DETECTED.  The SARS-CoV-2 RNA is generally detectable in upper and lower respiratory specimens during the acute phase of infection. The lowest concentration of SARS-CoV-2 viral copies this assay can detect is 250 copies / mL. A negative result does not preclude SARS-CoV-2 infection and should not be used as the sole basis for treatment or other patient management decisions.  A negative result may occur with improper specimen collection / handling, submission of specimen other than nasopharyngeal swab, presence of viral mutation(s) within the areas targeted by this assay, and inadequate number of viral copies (<250 copies / mL). A negative result must be combined with clinical observations, patient history, and epidemiological information.  Fact Sheet for Patients:    StrictlyIdeas.no  Fact Sheet for Healthcare Providers: BankingDealers.co.za  This test is not yet approved or  cleared by the [place] FDA and has been authorized for detection and/or diagnosis of SARS-CoV-2 by FDA under an Emergency Use Authorization (EUA).  This EUA will remain in effect (meaning this test can be used) for the duration of the COVID-19 declaration under Section 564(b)(1) of the Act, 21 U.S.C. section 360bbb-3(b)(1), unless the authorization is terminated or revoked sooner.  Performed at [facility], [address] [place], [place] [address]   MRSA PCR Screening     Status: None   Collection Time: 03/19/20 12:47 PM   Specimen: Nasopharyngeal  Result Value Ref Range Status   MRSA by PCR NEGATIVE NEGATIVE Final    Comment:        The GeneXpert MRSA Assay (FDA approved for NASAL specimens only), is one component of a comprehensive MRSA colonization surveillance program. It is not intended to diagnose MRSA infection nor to guide or monitor treatment for MRSA infections. Performed at [facility], [address] [place], [place] [address]      Labs: Basic Metabolic Panel: Recent Labs  Lab 03/18/20 1149 03/19/20 0406 03/20/20 0916  NA 136 136 132*  K 3.9 3.7 3.6  CL 97* 100 96*  CO2 22 21* 23  GLUCOSE 172* 172* 194*  BUN 21 20 16   CREATININE 1.45* 1.23 1.19  CALCIUM 9.4 9.1 8.6*  MG 1.7  --   --    Liver Function Tests: Recent Labs  Lab 03/18/20 1149  AST 19  ALT 10  ALKPHOS 89  BILITOT 0.5  PROT 8.0  ALBUMIN 3.1*   No results for input(s): LIPASE, AMYLASE in the last 168 hours. Recent Labs  Lab 03/18/20 1149  AMMONIA 15   CBC: Recent Labs  Lab 03/18/20 1149 03/18/20 1730 03/19/20 0406 03/20/20 0916  WBC 7.0 6.0 6.6 6.9  NEUTROABS 6.1 4.7  --   --   HGB 8.6* 8.4* 9.6* 8.2*  HCT 27.8* 27.6* 32.3* 28.0*  MCV 73.0* 73.0* 74.6* 75.7*  PLT 279 246 261 317    Cardiac Enzymes: No results for input(s): CKTOTAL, CKMB, CKMBINDEX, TROPONINI  in the last 168 hours. BNP: BNP (last 3 results) Recent Labs    03/18/20 1137 03/20/20 0916  BNP 49.3 23.8    ProBNP (last 3 results) No results for input(s): PROBNP in the last 8760 hours.  CBG: Recent Labs  Lab 03/19/20 1327 03/19/20 1733 03/19/20 2143 03/20/20 0729 03/20/20 1154  GLUCAP 136* 148* 159* 172* 161*       Signed:  Annita Brod, MD [facility] 03/20/2020, 2:43 PM

## 2020-03-20 NOTE — Evaluation (Addendum)
Physical Therapy Evaluation Patient Details Name: Hunter Davila MRN: [id number] DOB: Apr 10, 1956 Today's Date: 03/20/2020   History of Present Illness  Hunter Davila is a 31yoM c PMH: StgIV renal CA c mets, HTN, DM, GERD, hypoTSH. Pt comes to [facility] on 9/14 c AMS. Patient is followed by [facility].  Patient had systemic chemotherapy, radiation therapy and currently is receiving oral chemotherapy.  Clinical Impression  Pt admitted with above diagnosis. Pt currently with functional limitations due to the deficits listed below (see "PT Problem List"). Upon entry, pt in chair, awake and agreeable to participate. The pt is alert and oriented x4, pleasant, conversational, and generally a good historian. Pt has occasional anomia in session, as well as loss of train of thought, occasionally struggles with following verbal cues with accuracy. Pt able to complete transfers, AMB, stairs, all at supervision level or better, no gross asymetry, no unsteadiness, but is fatigued after 220f AMB, HR in 120s. Patient is at baseline, all education completed, and time is given to address all questions/concerns. No additional skilled PT services needed at this time, PT signing off. PT recommends daily ambulation ad lib or with nursing staff as needed to prevent deconditioning. Pt acknowledges weakness and deconditioning, agreeable that HHPT may be of utility in addressing these things.      Follow Up Recommendations Home health PT    Equipment Recommendations  None recommended by PT    Recommendations for Other Services       Precautions / Restrictions Precautions Precautions: None Precaution Comments: CA patient Restrictions Weight Bearing Restrictions: No      Mobility  Bed Mobility                  Transfers Overall transfer level: Independent                  Ambulation/Gait Ambulation/Gait assistance: Modified independent (Device/Increase time) Gait Distance (Feet):  240 Feet Assistive device: None Gait Pattern/deviations: WFL(Within Functional Limits) Gait velocity: 0661m   General Gait Details: fatigued, requires seated rest, HR 125bpm  Stairs Stairs: Yes   Stair Management: Two rails;Alternating pattern;Forwards;Backwards Number of Stairs: 8    Wheelchair Mobility    Modified Rankin (Stroke Patients Only)       Balance Overall balance assessment: Independent;No apparent balance deficits (not formally assessed)                                           Pertinent Vitals/Pain Pain Assessment: 0-10 Pain Score: 4  Pain Location: Low back (renal CA pain per pt) Pain Descriptors / Indicators: Aching Pain Intervention(s): Limited activity within patient's tolerance;Monitored during session;Patient requesting pain meds-RN notified    Home Living Family/patient expects to be discharged to:: Private residence Living Arrangements: Alone Available Help at Discharge: Friend(s) Type of Home: House Home Access: Stairs to enter Entrance Stairs-Rails: [employer] Steps: 7 Home Layout: One level Home Equipment: None      Prior Function Level of Independence: Independent         Comments: friends help with transport, groceries,meals     Hand Dominance        Extremity/Trunk Assessment   Upper Extremity Assessment Upper Extremity Assessment: Overall WFL for tasks assessed    Lower Extremity Assessment Lower Extremity Assessment: Overall WFL for tasks assessed       Communication   Communication: No difficulties  Cognition Arousal/Alertness: Awake/alert Behavior During Therapy: Flat affect;WFL for tasks assessed/performed Overall Cognitive Status: Within Functional Limits for tasks assessed                                 General Comments: questionable; a few instances of not following cues as given, or resopnding in unexpected, nonsensical way      General  Comments      Exercises     Assessment/Plan    PT Assessment All further PT needs can be met in the next venue of care  PT Problem List Decreased strength;Decreased activity tolerance;Decreased cognition;Cardiopulmonary status limiting activity       PT Treatment Interventions Gait training;Stair training;Functional mobility training;Therapeutic activities;Therapeutic exercise;Patient/family education    PT Goals (Current goals can be found in the Care Plan section)  Acute Rehab PT Goals PT Goal Formulation: All assessment and education complete, DC therapy    Frequency     Barriers to discharge        Co-evaluation               AM-PAC PT "6 Clicks" Mobility  Outcome Measure Help needed turning from your back to your side while in a flat bed without using bedrails?: None Help needed moving from lying on your back to sitting on the side of a flat bed without using bedrails?: None Help needed moving to and from a bed to a chair (including a wheelchair)?: None Help needed standing up from a chair using your arms (e.g., wheelchair or bedside chair)?: None Help needed to walk in hospital room?: None Help needed climbing 3-5 steps with a railing? : A Little 6 Click Score: 23    End of Session Equipment Utilized During Treatment: Gait belt Activity Tolerance: Patient tolerated treatment well;Patient limited by fatigue;Patient limited by pain Patient left: in chair;with call bell/phone within reach Nurse Communication: Mobility status;Patient requests pain meds PT Visit Diagnosis: Muscle weakness (generalized) (M62.81);Difficulty in walking, not elsewhere classified (R26.2)    Time: 1330-1350 PT Time Calculation (min) (ACUTE ONLY): 20 min   Charges:   PT Evaluation $PT Eval Moderate Complexity: 1 Mod          3:08 PM, 03/20/20 Etta Grandchild, PT, DPT Physical Therapist - [facility]  [phone] ([place])     [place]  C 03/20/2020, 3:05 PM

## 2020-03-21 LAB — T3, FREE: T3, Free: 1.5 pg/mL — ABNORMAL LOW (ref 2.0–4.4)

## 2020-03-23 LAB — CULTURE, BLOOD (ROUTINE X 2)
Culture: NO GROWTH
Culture: NO GROWTH
Special Requests: ADEQUATE

## 2020-04-13 ENCOUNTER — Emergency Department: Payer: Medicare Other

## 2020-04-13 ENCOUNTER — Inpatient Hospital Stay: Payer: Medicare Other

## 2020-04-13 ENCOUNTER — Inpatient Hospital Stay
Admission: EM | Admit: 2020-04-13 | Discharge: 2020-05-05 | DRG: 054 | Disposition: E | Payer: Medicare Other | Attending: Internal Medicine | Admitting: Internal Medicine

## 2020-04-13 ENCOUNTER — Other Ambulatory Visit: Payer: Self-pay

## 2020-04-13 DIAGNOSIS — R195 Other fecal abnormalities: Secondary | ICD-10-CM | POA: Diagnosis present

## 2020-04-13 DIAGNOSIS — I639 Cerebral infarction, unspecified: Secondary | ICD-10-CM

## 2020-04-13 DIAGNOSIS — J189 Pneumonia, unspecified organism: Secondary | ICD-10-CM | POA: Diagnosis not present

## 2020-04-13 DIAGNOSIS — E43 Unspecified severe protein-calorie malnutrition: Secondary | ICD-10-CM | POA: Diagnosis present

## 2020-04-13 DIAGNOSIS — Z833 Family history of diabetes mellitus: Secondary | ICD-10-CM

## 2020-04-13 DIAGNOSIS — Z8042 Family history of malignant neoplasm of prostate: Secondary | ICD-10-CM

## 2020-04-13 DIAGNOSIS — J690 Pneumonitis due to inhalation of food and vomit: Secondary | ICD-10-CM | POA: Diagnosis present

## 2020-04-13 DIAGNOSIS — I10 Essential (primary) hypertension: Secondary | ICD-10-CM | POA: Diagnosis present

## 2020-04-13 DIAGNOSIS — G9341 Metabolic encephalopathy: Secondary | ICD-10-CM | POA: Diagnosis present

## 2020-04-13 DIAGNOSIS — I6381 Other cerebral infarction due to occlusion or stenosis of small artery: Secondary | ICD-10-CM | POA: Diagnosis present

## 2020-04-13 DIAGNOSIS — D500 Iron deficiency anemia secondary to blood loss (chronic): Secondary | ICD-10-CM

## 2020-04-13 DIAGNOSIS — R4182 Altered mental status, unspecified: Secondary | ICD-10-CM | POA: Diagnosis not present

## 2020-04-13 DIAGNOSIS — E039 Hypothyroidism, unspecified: Secondary | ICD-10-CM | POA: Diagnosis present

## 2020-04-13 DIAGNOSIS — N39490 Overflow incontinence: Secondary | ICD-10-CM | POA: Diagnosis present

## 2020-04-13 DIAGNOSIS — N401 Enlarged prostate with lower urinary tract symptoms: Secondary | ICD-10-CM | POA: Diagnosis present

## 2020-04-13 DIAGNOSIS — Z7984 Long term (current) use of oral hypoglycemic drugs: Secondary | ICD-10-CM

## 2020-04-13 DIAGNOSIS — E872 Acidosis, unspecified: Secondary | ICD-10-CM | POA: Diagnosis present

## 2020-04-13 DIAGNOSIS — J181 Lobar pneumonia, unspecified organism: Secondary | ICD-10-CM | POA: Diagnosis not present

## 2020-04-13 DIAGNOSIS — C649 Malignant neoplasm of unspecified kidney, except renal pelvis: Secondary | ICD-10-CM | POA: Diagnosis present

## 2020-04-13 DIAGNOSIS — Z79899 Other long term (current) drug therapy: Secondary | ICD-10-CM

## 2020-04-13 DIAGNOSIS — C787 Secondary malignant neoplasm of liver and intrahepatic bile duct: Secondary | ICD-10-CM | POA: Diagnosis present

## 2020-04-13 DIAGNOSIS — E119 Type 2 diabetes mellitus without complications: Secondary | ICD-10-CM | POA: Diagnosis not present

## 2020-04-13 DIAGNOSIS — M549 Dorsalgia, unspecified: Secondary | ICD-10-CM

## 2020-04-13 DIAGNOSIS — C641 Malignant neoplasm of right kidney, except renal pelvis: Secondary | ICD-10-CM

## 2020-04-13 DIAGNOSIS — C7931 Secondary malignant neoplasm of brain: Principal | ICD-10-CM

## 2020-04-13 DIAGNOSIS — C7800 Secondary malignant neoplasm of unspecified lung: Secondary | ICD-10-CM | POA: Diagnosis not present

## 2020-04-13 DIAGNOSIS — D509 Iron deficiency anemia, unspecified: Secondary | ICD-10-CM | POA: Diagnosis not present

## 2020-04-13 DIAGNOSIS — G4700 Insomnia, unspecified: Secondary | ICD-10-CM | POA: Diagnosis present

## 2020-04-13 DIAGNOSIS — R6520 Severe sepsis without septic shock: Secondary | ICD-10-CM

## 2020-04-13 DIAGNOSIS — N179 Acute kidney failure, unspecified: Secondary | ICD-10-CM | POA: Diagnosis not present

## 2020-04-13 DIAGNOSIS — D6869 Other thrombophilia: Secondary | ICD-10-CM | POA: Diagnosis present

## 2020-04-13 DIAGNOSIS — D849 Immunodeficiency, unspecified: Secondary | ICD-10-CM | POA: Diagnosis present

## 2020-04-13 DIAGNOSIS — Z66 Do not resuscitate: Secondary | ICD-10-CM | POA: Diagnosis present

## 2020-04-13 DIAGNOSIS — R627 Adult failure to thrive: Secondary | ICD-10-CM

## 2020-04-13 DIAGNOSIS — Z20822 Contact with and (suspected) exposure to covid-19: Secondary | ICD-10-CM | POA: Diagnosis present

## 2020-04-13 DIAGNOSIS — D638 Anemia in other chronic diseases classified elsewhere: Secondary | ICD-10-CM | POA: Diagnosis present

## 2020-04-13 DIAGNOSIS — Z515 Encounter for palliative care: Secondary | ICD-10-CM

## 2020-04-13 DIAGNOSIS — K219 Gastro-esophageal reflux disease without esophagitis: Secondary | ICD-10-CM | POA: Diagnosis present

## 2020-04-13 DIAGNOSIS — G9340 Encephalopathy, unspecified: Secondary | ICD-10-CM | POA: Diagnosis present

## 2020-04-13 DIAGNOSIS — A419 Sepsis, unspecified organism: Secondary | ICD-10-CM | POA: Diagnosis not present

## 2020-04-13 DIAGNOSIS — C7951 Secondary malignant neoplasm of bone: Secondary | ICD-10-CM | POA: Diagnosis present

## 2020-04-13 DIAGNOSIS — I6389 Other cerebral infarction: Secondary | ICD-10-CM | POA: Diagnosis not present

## 2020-04-13 DIAGNOSIS — D649 Anemia, unspecified: Secondary | ICD-10-CM | POA: Diagnosis not present

## 2020-04-13 DIAGNOSIS — M79604 Pain in right leg: Secondary | ICD-10-CM

## 2020-04-13 DIAGNOSIS — M79605 Pain in left leg: Secondary | ICD-10-CM

## 2020-04-13 DIAGNOSIS — C771 Secondary and unspecified malignant neoplasm of intrathoracic lymph nodes: Secondary | ICD-10-CM

## 2020-04-13 DIAGNOSIS — Z6825 Body mass index (BMI) 25.0-25.9, adult: Secondary | ICD-10-CM

## 2020-04-13 DIAGNOSIS — K5900 Constipation, unspecified: Secondary | ICD-10-CM | POA: Diagnosis present

## 2020-04-13 DIAGNOSIS — G893 Neoplasm related pain (acute) (chronic): Secondary | ICD-10-CM | POA: Diagnosis present

## 2020-04-13 DIAGNOSIS — Z79890 Hormone replacement therapy (postmenopausal): Secondary | ICD-10-CM

## 2020-04-13 LAB — CBC WITH DIFFERENTIAL/PLATELET
Abs Immature Granulocytes: 0.03 10*3/uL (ref 0.00–0.07)
Abs Immature Granulocytes: 0.02 10*3/uL (ref 0.00–0.07)
Basophils Absolute: 0 10*3/uL (ref 0.0–0.1)
Basophils Absolute: 0 10*3/uL (ref 0.0–0.1)
Basophils Relative: 0 %
Basophils Relative: 1 %
Eosinophils Absolute: 0.2 10*3/uL (ref 0.0–0.5)
Eosinophils Absolute: 0.2 10*3/uL (ref 0.0–0.5)
Eosinophils Relative: 3 %
Eosinophils Relative: 3 %
HCT: 27.1 % — ABNORMAL LOW (ref 39.0–52.0)
HCT: 23.3 % — ABNORMAL LOW (ref 39.0–52.0)
Hemoglobin: 7.9 g/dL — ABNORMAL LOW (ref 13.0–17.0)
Hemoglobin: 6.9 g/dL — ABNORMAL LOW (ref 13.0–17.0)
Immature Granulocytes: 0 %
Immature Granulocytes: 0 %
Lymphocytes Relative: 9 %
Lymphocytes Relative: 10 %
Lymphs Abs: 0.6 10*3/uL — ABNORMAL LOW (ref 0.7–4.0)
Lymphs Abs: 0.5 10*3/uL — ABNORMAL LOW (ref 0.7–4.0)
MCH: 22.2 pg — ABNORMAL LOW (ref 26.0–34.0)
MCH: 22.2 pg — ABNORMAL LOW (ref 26.0–34.0)
MCHC: 29.2 g/dL — ABNORMAL LOW (ref 30.0–36.0)
MCHC: 29.6 g/dL — ABNORMAL LOW (ref 30.0–36.0)
MCV: 76.1 fL — ABNORMAL LOW (ref 80.0–100.0)
MCV: 74.9 fL — ABNORMAL LOW (ref 80.0–100.0)
Monocytes Absolute: 0.6 10*3/uL (ref 0.1–1.0)
Monocytes Absolute: 0.5 10*3/uL (ref 0.1–1.0)
Monocytes Relative: 9 %
Monocytes Relative: 10 %
Neutro Abs: 5.3 10*3/uL (ref 1.7–7.7)
Neutro Abs: 3.8 10*3/uL (ref 1.7–7.7)
Neutrophils Relative %: 79 %
Neutrophils Relative %: 76 %
Platelets: 352 10*3/uL (ref 150–400)
Platelets: 323 10*3/uL (ref 150–400)
RBC: 3.56 MIL/uL — ABNORMAL LOW (ref 4.22–5.81)
RBC: 3.11 MIL/uL — ABNORMAL LOW (ref 4.22–5.81)
RDW: 19.1 % — ABNORMAL HIGH (ref 11.5–15.5)
RDW: 19.2 % — ABNORMAL HIGH (ref 11.5–15.5)
WBC: 6.7 10*3/uL (ref 4.0–10.5)
WBC: 5 10*3/uL (ref 4.0–10.5)
nRBC: 0 % (ref 0.0–0.2)
nRBC: 0 % (ref 0.0–0.2)

## 2020-04-13 LAB — COMPREHENSIVE METABOLIC PANEL WITH GFR
ALT: 10 U/L (ref 0–44)
AST: 26 U/L (ref 15–41)
Albumin: 2.7 g/dL — ABNORMAL LOW (ref 3.5–5.0)
Alkaline Phosphatase: 80 U/L (ref 38–126)
Anion gap: 14 (ref 5–15)
BUN: 12 mg/dL (ref 8–23)
CO2: 20 mmol/L — ABNORMAL LOW (ref 22–32)
Calcium: 8.4 mg/dL — ABNORMAL LOW (ref 8.9–10.3)
Chloride: 101 mmol/L (ref 98–111)
Creatinine, Ser: 0.99 mg/dL (ref 0.61–1.24)
GFR, Estimated: >60 mL/min
Glucose, Bld: 167 mg/dL — ABNORMAL HIGH (ref 70–99)
Potassium: 4.5 mmol/L (ref 3.5–5.1)
Sodium: 135 mmol/L (ref 135–145)
Total Bilirubin: 0.3 mg/dL (ref 0.3–1.2)
Total Protein: 7.9 g/dL (ref 6.5–8.1)

## 2020-04-13 LAB — URINE DRUG SCREEN, QUALITATIVE (ARMC ONLY)
Amphetamines, Ur Screen: NOT DETECTED
Barbiturates, Ur Screen: NOT DETECTED
Benzodiazepine, Ur Scrn: NOT DETECTED
Cannabinoid 50 Ng, Ur ~~LOC~~: NOT DETECTED
Cocaine Metabolite,Ur ~~LOC~~: NOT DETECTED
MDMA (Ecstasy)Ur Screen: NOT DETECTED
Methadone Scn, Ur: NOT DETECTED
Opiate, Ur Screen: NOT DETECTED
Phencyclidine (PCP) Ur S: NOT DETECTED
Tricyclic, Ur Screen: NOT DETECTED

## 2020-04-13 LAB — URINALYSIS, COMPLETE (UACMP) WITH MICROSCOPIC
Bacteria, UA: NONE SEEN
Bilirubin Urine: NEGATIVE
Glucose, UA: NEGATIVE mg/dL
Ketones, ur: NEGATIVE mg/dL
Leukocytes,Ua: NEGATIVE
Nitrite: NEGATIVE
Protein, ur: 30 mg/dL — AB
Specific Gravity, Urine: 1.004 — ABNORMAL LOW (ref 1.005–1.030)
Squamous Epithelial / HPF: NONE SEEN (ref 0–5)
pH: 6 (ref 5.0–8.0)

## 2020-04-13 LAB — LACTIC ACID, PLASMA
Lactic Acid, Venous: 2.4 mmol/L (ref 0.5–1.9)
Lactic Acid, Venous: 2.5 mmol/L (ref 0.5–1.9)
Lactic Acid, Venous: 1.4 mmol/L (ref 0.5–1.9)

## 2020-04-13 LAB — LIPASE, BLOOD: Lipase: 20 U/L (ref 11–51)

## 2020-04-13 LAB — PROTIME-INR
INR: 1 (ref 0.8–1.2)
Prothrombin Time: 13.1 s (ref 11.4–15.2)

## 2020-04-13 LAB — RESPIRATORY PANEL BY RT PCR (FLU A&B, COVID)
Influenza A by PCR: NEGATIVE
Influenza B by PCR: NEGATIVE
SARS Coronavirus 2 by RT PCR: NEGATIVE

## 2020-04-13 LAB — HIV ANTIBODY (ROUTINE TESTING W REFLEX): HIV Screen 4th Generation wRfx: NONREACTIVE

## 2020-04-13 LAB — GLUCOSE, CAPILLARY
Glucose-Capillary: 94 mg/dL (ref 70–99)
Glucose-Capillary: 140 mg/dL — ABNORMAL HIGH (ref 70–99)
Glucose-Capillary: 123 mg/dL — ABNORMAL HIGH (ref 70–99)

## 2020-04-13 LAB — OCCULT BLOOD X 1 CARD TO LAB, STOOL: Fecal Occult Bld: POSITIVE — AB

## 2020-04-13 LAB — TROPONIN I (HIGH SENSITIVITY): Troponin I (High Sensitivity): 8 ng/L

## 2020-04-13 MED ORDER — TAMSULOSIN HCL 0.4 MG PO CAPS
0.4000 mg | ORAL_CAPSULE | Freq: Every day | ORAL | Status: DC
  Administered 2020-04-13 – 2020-04-17 (×5): 0.4 mg via ORAL
  Filled 2020-04-13 (×5): qty 1

## 2020-04-13 MED ORDER — SODIUM CHLORIDE 0.9% IV SOLUTION
Freq: Once | INTRAVENOUS | Status: AC
  Filled 2020-04-13: qty 250

## 2020-04-13 MED ORDER — ENOXAPARIN SODIUM 40 MG/0.4ML ~~LOC~~ SOLN
40.0000 mg | SUBCUTANEOUS | Status: DC
  Administered 2020-04-13: 40 mg via SUBCUTANEOUS
  Filled 2020-04-13: qty 0.4

## 2020-04-13 MED ORDER — SODIUM CHLORIDE 0.9 % IV SOLN
2.0000 g | INTRAVENOUS | Status: DC
  Administered 2020-04-13 – 2020-04-14 (×2): 2 g via INTRAVENOUS
  Filled 2020-04-13 (×2): qty 20

## 2020-04-13 MED ORDER — LORAZEPAM 2 MG/ML IJ SOLN
1.0000 mg | Freq: Once | INTRAMUSCULAR | Status: AC
  Administered 2020-04-13: 1 mg via INTRAVENOUS

## 2020-04-13 MED ORDER — AMLODIPINE BESYLATE 10 MG PO TABS
10.0000 mg | ORAL_TABLET | Freq: Every day | ORAL | Status: DC
  Administered 2020-04-13 – 2020-04-17 (×5): 10 mg via ORAL
  Filled 2020-04-13: qty 1
  Filled 2020-04-13: qty 2
  Filled 2020-04-13 (×2): qty 1
  Filled 2020-04-13: qty 2

## 2020-04-13 MED ORDER — SENNA 8.6 MG PO TABS
1.0000 | ORAL_TABLET | Freq: Every day | ORAL | Status: DC
  Administered 2020-04-13 – 2020-04-14 (×2): 8.6 mg via ORAL

## 2020-04-13 MED ORDER — LACTATED RINGERS IV BOLUS (SEPSIS)
1000.0000 mL | Freq: Once | INTRAVENOUS | Status: AC
  Administered 2020-04-13: 1000 mL via INTRAVENOUS

## 2020-04-13 MED ORDER — OLANZAPINE 5 MG PO TABS
5.0000 mg | ORAL_TABLET | Freq: Every day | ORAL | Status: DC
  Administered 2020-04-13: 5 mg via ORAL
  Filled 2020-04-13: qty 1

## 2020-04-13 MED ORDER — INSULIN ASPART 100 UNIT/ML ~~LOC~~ SOLN
0.0000 [IU] | Freq: Three times a day (TID) | SUBCUTANEOUS | Status: DC
  Administered 2020-04-14 (×2): 2 [IU] via SUBCUTANEOUS
  Administered 2020-04-15: 3 [IU] via SUBCUTANEOUS
  Administered 2020-04-16: 2 [IU] via SUBCUTANEOUS
  Administered 2020-04-16 – 2020-04-18 (×3): 3 [IU] via SUBCUTANEOUS
  Filled 2020-04-13 (×7): qty 1

## 2020-04-13 MED ORDER — LACTATED RINGERS IV SOLN: INTRAVENOUS | Status: DC

## 2020-04-13 MED ORDER — SODIUM CHLORIDE 0.9 % IV SOLN
500.0000 mg | INTRAVENOUS | Status: DC
  Administered 2020-04-13 – 2020-04-14 (×2): 500 mg via INTRAVENOUS
  Filled 2020-04-13 (×3): qty 500

## 2020-04-13 MED ORDER — LENVATINIB (14 MG DAILY DOSE) 10 & 4 MG PO CPPK: 12.0000 mg | ORAL_CAPSULE | Freq: Every day | ORAL | Status: DC

## 2020-04-13 MED ORDER — LEVOTHYROXINE SODIUM 50 MCG PO TABS
150.0000 ug | ORAL_TABLET | Freq: Every day | ORAL | Status: DC
  Administered 2020-04-13 – 2020-04-17 (×5): 150 ug via ORAL
  Filled 2020-04-13 (×2): qty 1
  Filled 2020-04-13 (×2): qty 3
  Filled 2020-04-13: qty 1

## 2020-04-13 MED ORDER — PANTOPRAZOLE SODIUM 40 MG IV SOLR
40.0000 mg | Freq: Two times a day (BID) | INTRAVENOUS | Status: DC
  Administered 2020-04-13 – 2020-04-18 (×10): 40 mg via INTRAVENOUS
  Filled 2020-04-13 (×10): qty 40

## 2020-04-13 MED ORDER — EVEROLIMUS 5 MG PO TABS: 5.0000 mg | ORAL_TABLET | Freq: Every day | ORAL | Status: DC

## 2020-04-13 MED ORDER — FAMOTIDINE 20 MG PO TABS: 20.0000 mg | ORAL_TABLET | Freq: Every day | ORAL | Status: DC | PRN

## 2020-04-13 MED ORDER — SODIUM CHLORIDE 0.9 % IV SOLN: 2.0000 g | INTRAVENOUS | Status: DC

## 2020-04-13 MED ORDER — LORAZEPAM 2 MG/ML IJ SOLN
1.0000 mg | Freq: Once | INTRAMUSCULAR | Status: AC | PRN
  Administered 2020-04-15: 1 mg via INTRAVENOUS
  Filled 2020-04-13: qty 1

## 2020-04-13 MED ORDER — BACLOFEN 10 MG PO TABS
10.0000 mg | ORAL_TABLET | Freq: Three times a day (TID) | ORAL | Status: DC
  Administered 2020-04-13 – 2020-04-17 (×11): 10 mg via ORAL
  Filled 2020-04-13 (×30): qty 1

## 2020-04-13 MED ORDER — LORAZEPAM 2 MG/ML IJ SOLN
INTRAMUSCULAR | Status: AC
  Filled 2020-04-13: qty 1

## 2020-04-13 MED ORDER — SODIUM CHLORIDE 0.9 % IV SOLN: 500.0000 mg | INTRAVENOUS | Status: DC

## 2020-04-13 MED ORDER — SODIUM CHLORIDE 0.9 % IV SOLN: INTRAVENOUS | Status: DC

## 2020-04-13 MED ORDER — ACETAMINOPHEN 500 MG PO TABS: 500.0000 mg | ORAL_TABLET | Freq: Three times a day (TID) | ORAL | Status: DC | PRN

## 2020-04-13 MED ORDER — GADOBUTROL 1 MMOL/ML IV SOLN
5.0000 mL | Freq: Once | INTRAVENOUS | Status: AC | PRN
  Administered 2020-04-13: 7.5 mL via INTRAVENOUS
  Filled 2020-04-13: qty 6

## 2020-04-13 MED ORDER — LACTULOSE 10 GM/15ML PO SOLN: 20.0000 g | Freq: Every day | ORAL | Status: DC | PRN

## 2020-04-13 NOTE — ED Notes (Signed)
Pt ate supper and insulin not given d/t CBG 94

## 2020-04-13 NOTE — Consult Note (Signed)
[facility]  Date of admission:  04/30/2020  Inpatient day:  04/11/2020  Consulting physician: Dr Royce Macadamia Agbata.   Reason for Consultation:  AMS.  History of renal cell CA with recent medication dose adjustment.  Chief Complaint: Hunter Davila is a 64 y.o. male with metastatic renal cell carcinoma on lenvatinib and axitinib who is asmitted through the emergency room with altered mental status.  HPI: The patient was diagnosed with high risk metastatic renal cell carcinoma in 12/2017.  CT scans revealed a 10.7 cm right renal mass with renal vein invasion, perirenal metastatic implants along the capsule of the liver, numerous bilateral pulmonary metastasis, and mediastinal adenopathy.  Head MRI in 01/2018 noted an expansile enhancing lesion of the right frontal bone suspicious for metastasis.  There was no epidural extension and no evidence of CNS metastasis.  The patient began ipilimumab and nivolumab on 02/07/2018.  Head MRI on 07/2018 revealed new metastasis to the left occipital and right medial frontal cortex 1 (largest 12 mm) and improved calvarium metastasis.  He underwent SRS.  CT scans in 11/2018 revealed enlarging retroperitoneal adenopathy.  Head MRI on 12/2018 revealed 2 new lesions.  He underwent SRS.  In 12/2018 he began cabozantinib and Opdivo.  Head MRI in 03/2019 revealed slight increase in size of 2 previously described metastasis.  CT scans revealed the right renal mass was enlarging.  He underwent SRS to the brain lesion in 05/2019.  He began pembrolizumab and axitinib in 04/2019.  Head MRI in 07/2019 was stable.  CT scans in 07/2019 revealed progression.  He was switched to lenvatinib and everolimus on 08/2019.  Head MRI in 08/2019 feels no acute findings.  CT scans in 10/2019 were stable.  The scans in 01/2020 noted waxing and waning lung nodules.  Head MRI was stable.  The patient was admitted to [facility] from 03/18/2020 - 03/20/2020 with confusion.   Notes indicate that the patient had been taking lenvatinib and everolimus att high doses causing confusion, so his dose had been decreased from 18 mg to 10 mg and then recently increased back to 14 mg.  He had a low-grade temperature of 100.5 and was tachycardic and tachypneic.  He had no evidence of infection with a normal WBC, CXR, and urinalysis.  Lactic acid was elevated 3.6.  He was pan-cultured and started on IV antibiotics and fluids.  Lactic acidosis with was felt secondary to Metformin.  Hunter Davila was discontinued and he was started on low-dose glipizide.  TSH was elevated; his Synthroid was adjusted.  He was discharged on lenvatinib 12 mg a day.  He was last seen in the medical oncology clinic by Dr.Sundhar Ramalingam at [facility] on 04/09/2020.  Somatically, he had ongoing back pain (no worse than prior months).  He had no nausea.  Plan was to continue lenvatinib 10 mg alternating with 14 mg and everolimus 5 mg.  An MRI to follow-up his brain metastasis was to be scheduled.  Review of outside CT scans from [facility] on 03/18/2020 (available after his appointment) revealed a stable right renal mass, new right renal vein extension and occlusion with probable involvement of the IVC.  There was increasing size and number of bilateral pulmonary nodules.  There was a new right adrenal nodule, mildly increasing retroperitoneal adenopathy, and a new subcentimeter peritoneal nodule.  There was stable supraclavicular and mediastinal adenopathy, lytic bone metastasis and right chest wall soft tissue nodule.  Treatment options were to be discussed at his next visit.  He  presented to the [facility] via EMS for a change in mental status associated with emesis.  Head CT without contrast noted cerebral and cerebellar atrophy.  There was mild ventriculomegaly felt secondary to cerebral atrophy and mild small vessel ischemic change.  CXR revealed a small left pleural effusion with adjacent airspace disease, atelectasis or infection.  EKG  revealed sinus tachycardia  CBC on presentation included hematocrit of 27.1, hemoglobin 7.9, MCV 76.1, platelets 352,000, WBC 6700 with an ANC of 5300.  Lactic acid was 2.4.  Creatinine was 0.99.  Calcium was 8.4 with an albumin of 2.7.  LFTs were normal.   Symptomatically, he denies any complaints.  He denies any pain.  He remembers having a bout of emesis yesterday.  He denies any shortness of breath.  He denies any fever.  He does not remember being admitted to [facility] last month.  He remembers 3 words immediately, but only 1 word in 5 minutes.  He denies any headache, change in vision, numbness weakness, balance or coordination issues.   Past Medical History:  Diagnosis Date  . Cancer ([place])   . Diabetes mellitus without complication ([place])   . Hypertension   . Renal disorder    Renal cancer    History reviewed. No pertinent surgical history.  Family History  Problem Relation Age of Onset  . Diabetes Mellitus II Mother   . Prostate cancer Father     Social History:  reports that he has never smoked. He has never used smokeless tobacco. He reports previous alcohol use. No history on file for drug use.  The patient denies any exposure to radiation or toxins.  The patient lives alone in [place].  He is able to take care of his activities of daily living.  He is helped by his ex-fianc, Hunter Davila.  He is alone today.  Allergies: No Known Allergies  (Not in a hospital admission)   Review of Systems: GENERAL:  Feels "fine".  No fevers, sweats or weight loss. PERFORMANCE STATUS (ECOG):  2 HEENT:  No visual changes, runny nose, sore throat, mouth sores or tenderness. Lungs: No shortness of breath or cough.  No hemoptysis. Cardiac:  No chest pain, palpitations, orthopnea, or PND. GI:  Nausea.  Emesis yesterday.  No diarrhea, constipation, melena or hematochezia. GU:  No urgency, frequency, dysuria, or hematuria. Musculoskeletal:  No back pain.  No joint pain.  No muscle  tenderness. Extremities:  Lower extremity swelling. Skin:  No rashes or skin changes. Neuro:  No headache, numbness or weakness, balance or coordination issues. Endocrine:  Diabetes.  No thyroid issues, hot flashes or night sweats. Psych:  No mood changes, depression or anxiety. Pain:  No focal pain. Review of systems:  All other systems reviewed and found to be negative.  Physical Exam:  Blood pressure (!) 162/103, pulse (!) 27, temperature 98.4 F (36.9 C), temperature source Oral, resp. rate 12, height 6\' 2"  (1.88 m), weight 201 lb (91.2 kg), SpO2 91 %.  GENERAL:  Well developed, well nourished, gentleman lying comfortably in the ER in no acute distress. MENTAL STATUS:  Alert and oriented to person.  He states that it is October 2019 and he is in clinic. HEAD:  Pearline Cables hair and beard.  Normocephalic, atraumatic, face symmetric, no Cushingoid features. EYES:  Blue eyes.  Pupils equal round and reactive to light and accomodation.  No conjunctivitis or scleral icterus. ENT:  Oropharynx clear without lesion.  Tongue normal. Mucous membranes moist.  RESPIRATORY:  Clear to  auscultation without rales, wheezes or rhonchi. CARDIOVASCULAR:  Regular rate and rhythm without murmur, rub or gallop. ABDOMEN:  Soft, non-tender, with active bowel sounds, and no hepatosplenomegaly.  Right upper quadrant/flank fullness. SKIN:  No rashes, ulcers or lesions. EXTREMITIES: No edema, no skin discoloration or tenderness.  No palpable cords. LYMPH NODES: No palpable cervical, supraclavicular, axillary or inguinal adenopathy  NEUROLOGICAL:  Strength and sensation symmetric. Remembers 3 of 3 words immediately and 1 of 3 words in 5 minutes.  Follows commands. PSYCH:  Appropriate.   Results for orders placed or performed during the hospital encounter of 04/15/2020 (from the past 48 hour(s))  Lactic acid, plasma     Status: Abnormal   Collection Time: 04/06/2020  3:58 AM  Result Value Ref Range   Lactic Acid, Venous 2.4  (HH) 0.5 - 1.9 mmol/L    Comment: CRITICAL RESULT CALLED TO, READ BACK BY AND VERIFIED WITH KASSIE FELTS ON 04/11/2020 AT 0458 BY JAG Performed at [facility], [address] [place], [place] [address]   Comprehensive metabolic panel     Status: Abnormal   Collection Time: 04/27/2020  3:58 AM  Result Value Ref Range   Sodium 135 135 - 145 mmol/L   Potassium 4.5 3.5 - 5.1 mmol/L   Chloride 101 98 - 111 mmol/L   CO2 20 (L) 22 - 32 mmol/L   Glucose, Bld 167 (H) 70 - 99 mg/dL    Comment: Glucose reference range applies only to samples taken after fasting for at least 8 hours.   BUN 12 8 - 23 mg/dL   Creatinine, Ser 0.99 0.61 - 1.24 mg/dL   Calcium 8.4 (L) 8.9 - 10.3 mg/dL   Total Protein 7.9 6.5 - 8.1 g/dL   Albumin 2.7 (L) 3.5 - 5.0 g/dL   AST 26 15 - 41 U/L   ALT 10 0 - 44 U/L   Alkaline Phosphatase 80 38 - 126 U/L   Total Bilirubin 0.3 0.3 - 1.2 mg/dL   GFR, Estimated >60 >60 mL/min   Anion gap 14 5 - 15    Comment: Performed at [facility], [address] [place], [place] [address]  CBC WITH DIFFERENTIAL     Status: Abnormal   Collection Time: 04/12/2020  3:58 AM  Result Value Ref Range   WBC 6.7 4.0 - 10.5 K/uL   RBC 3.56 (L) 4.22 - 5.81 MIL/uL   Hemoglobin 7.9 (L) 13.0 - 17.0 g/dL    Comment: Reticulocyte Hemoglobin testing may be clinically indicated, consider ordering this additional test [id number]    HCT 27.1 (L) 39 - 52 %   MCV 76.1 (L) 80.0 - 100.0 fL   MCH 22.2 (L) 26.0 - 34.0 pg   MCHC 29.2 (L) 30.0 - 36.0 g/dL   RDW 19.1 (H) 11.5 - 15.5 %   Platelets 352 150 - 400 K/uL   nRBC 0.0 0.0 - 0.2 %   Neutrophils Relative % 79 %   Neutro Abs 5.3 1.7 - 7.7 K/uL   Lymphocytes Relative 9 %   Lymphs Abs 0.6 (L) 0.7 - 4.0 K/uL   Monocytes Relative 9 %   Monocytes Absolute 0.6 0.1 - 1.0 K/uL   Eosinophils Relative 3 %   Eosinophils Absolute 0.2 0 - 0 K/uL   Basophils Relative 0 %   Basophils Absolute 0.0 0 - 0 K/uL   Immature Granulocytes 0 %   Abs  Immature Granulocytes 0.03 0.00 - 0.07 K/uL    Comment: Performed at  [facility], [address] [place], [place] [address]  Protime-INR     Status: None   Collection Time: 04/18/2020  3:58 AM  Result Value Ref Range   Prothrombin Time 13.1 11.4 - 15.2 seconds   INR 1.0 0.8 - 1.2    Comment: (NOTE) INR goal varies based on device and disease states. Performed at [facility], [address] [place], [place] [address]   Lipase, blood     Status: None   Collection Time: 04/30/2020  3:58 AM  Result Value Ref Range   Lipase 20 11 - 51 U/L    Comment: Performed at [facility], [place], [place] [address]  Troponin I (High Sensitivity)     Status: None   Collection Time: 04/08/2020  3:58 AM  Result Value Ref Range   Troponin I (High Sensitivity) 8 <18 ng/L    Comment: (NOTE) Elevated high sensitivity troponin I (hsTnI) values and significant  changes across serial measurements may suggest ACS but many other  chronic and acute conditions are known to elevate hsTnI results.  Refer to the "Links" section for chest pain algorithms and additional  guidance. Performed at [facility], [address], [place], [place] [address]   Respiratory Panel by RT PCR (Flu A&B, Covid) - Nasopharyngeal Swab     Status: None   Collection Time: 04/24/2020  3:58 AM   Specimen: Nasopharyngeal Swab  Result Value Ref Range   SARS Coronavirus 2 by RT PCR NEGATIVE NEGATIVE    Comment: (NOTE) SARS-CoV-2 target nucleic acids are NOT DETECTED.  The SARS-CoV-2 RNA is generally detectable in upper respiratoy specimens during the acute phase of infection. The lowest concentration of SARS-CoV-2 viral copies this assay can detect is 131 copies/mL. A negative result does not preclude SARS-Cov-2 infection and should not be used as the sole basis for treatment or other patient management decisions. A negative result may occur with  improper specimen  collection/handling, submission of specimen other than nasopharyngeal swab, presence of viral mutation(s) within the areas targeted by this assay, and inadequate number of viral copies (<131 copies/mL). A negative result must be combined with clinical observations, patient history, and epidemiological information. The expected result is Negative.  Fact Sheet for Patients:  PinkCheek.be  Fact Sheet for Healthcare Providers:  GravelBags.it  This test is no t yet approved or cleared by the [place] FDA and  has been authorized for detection and/or diagnosis of SARS-CoV-2 by FDA under an Emergency Use Authorization (EUA). This EUA will remain  in effect (meaning this test can be used) for the duration of the COVID-19 declaration under Section 564(b)(1) of the Act, 21 U.S.C. section 360bbb-3(b)(1), unless the authorization is terminated or revoked sooner.     Influenza A by PCR NEGATIVE NEGATIVE   Influenza B by PCR NEGATIVE NEGATIVE    Comment: (NOTE) The Xpert Xpress SARS-CoV-2/FLU/RSV assay is intended as an aid in  the diagnosis of influenza from Nasopharyngeal swab specimens and  should not be used as a sole basis for treatment. Nasal washings and  aspirates are unacceptable for Xpert Xpress SARS-CoV-2/FLU/RSV  testing.  Fact Sheet for Patients: PinkCheek.be  Fact Sheet for Healthcare Providers: GravelBags.it  This test is not yet approved or cleared by the [place] FDA and  has been authorized for detection and/or diagnosis of SARS-CoV-2 by  FDA under an Emergency Use Authorization (EUA). This EUA will remain  in effect (meaning this test can be used)  for the duration of the  Covid-19 declaration under Section 564(b)(1) of the Act, 21  U.S.C. section 360bbb-3(b)(1), unless the authorization is  terminated or revoked. Performed at [facility], [address] [place], [place] [address]   Lactic acid, plasma     Status: Abnormal   Collection Time: 04/16/2020  6:41 AM  Result Value Ref Range   Lactic Acid, Venous 2.5 (HH) 0.5 - 1.9 mmol/L    Comment: CRITICAL VALUE NOTED. VALUE IS CONSISTENT WITH PREVIOUSLY REPORTED/CALLED VALUE.PMF Performed at [facility], [address], [place], [place] [address]    CT Head Wo Contrast  Result Date: 04/28/2020 CLINICAL DATA:  Mental status change. EXAM: CT HEAD WITHOUT CONTRAST TECHNIQUE: Contiguous axial images were obtained from the base of the skull through the vertex without intravenous contrast. COMPARISON:  08/27/2019 FINDINGS: Brain: Age advanced cerebral atrophy. Mild ventriculomegaly is similar, favored to be secondary to atrophy. Mild low density in the periventricular white matter likely related to small vessel disease. there is also age advanced cerebellar atrophy. No mass lesion, hemorrhage, acute infarct, intra-axial, or extra-axial fluid collection. Vascular: Intracranial atherosclerosis. Skull: Normal skull. Sinuses/Orbits: Normal imaged portions of the orbits and globes. Clear paranasal sinuses and mastoid air cells. Other: None. IMPRESSION: 1. No acute intracranial abnormality. 2. Cerebral and cerebellar atrophy. Similar mild ventriculomegaly, felt to be secondary to cerebral atrophy. 3. Mild small vessel ischemic change. Electronically Signed   By: Abigail Miyamoto M.D.   On: 05/04/2020 05:57   CT CHEST WO CONTRAST  Result Date: 05/01/2020 CLINICAL DATA:  Pleural effusion. History of metastatic renal cell carcinoma. EXAM: CT CHEST WITHOUT CONTRAST TECHNIQUE: Multidetector CT imaging of the chest was performed following the standard protocol without IV contrast. COMPARISON:  March 18, 2020, April 13, 2020 FINDINGS: Cardiovascular: Three-vessel coronary artery atherosclerotic calcifications. Heart is at the upper limits of normal in size. Scattered atherosclerotic  calcifications throughout the aorta. No pericardial effusion. Mediastinum/Nodes: No axillary adenopathy. RIGHT supraclavicular lymph node measures 10 mm in the short axis, previously 8 mm (series 2, image 11). LEFT supraclavicular lymph node measures 10 mm in the short axis, previously 8 mm (series 2, image 14). RIGHT paratracheal lymph node measures 10 mm, previously 8 mm (series 2, image 47). Additional RIGHT paratracheal lymph node measures 11 mm in the short axis, previously 10 mm (series 2, image 53). Lymph node posterior to the esophagus measures 8 mm, previously 5 mm (series 2, image 74). Lungs/Pleura: Moderate LEFT and trace RIGHT pleural effusion. There is irregular consolidative opacity of the LEFT lower lobe, new since prior. There is scattered irregular consolidative opacity of the RIGHT lower lobe which may reflect a combination of pulmonary nodules and consolidation. There are innumerable pulmonary nodules. Many of these nodules are relatively ill-defined with central lucency. The size and conspicuity of pulmonary nodules has increased in comparison to prior. Representative nodules are as follows: Nodule 1: LEFT upper lobe ill-defined nodule with central lucency measures 12 mm, previously 10 mm (series 2, image 42). Nodule 2: LEFT upper lobe ill-defined nodule with central lucency measures 9 mm, previously 6 mm (series 2, image 66). Nodule 3: RIGHT lower lobe ill-defined nodule with central lucency measures 7 mm, previously 4 mm (series 2, image 63). Upper Abdomen: Increased RIGHT adrenal nodularity measuring 3.3 x 1.9 cm, previously 2.8 x 1.4 cm (series 2, image 148). Infiltrative RIGHT renal mass is poorly visualized on current exam. Musculoskeletal: 15 mm RIGHT axillary subcutaneous mass, previously 14 mm. Mild degenerative  changes of the thoracic spine. There is a lytic lesion of the RIGHT aspect of T12, increased in comparison to prior IMPRESSION: 1. Moderate LEFT and trace RIGHT pleural  effusions. 2. Irregular consolidative opacity of the LEFT greater than RIGHT lower lobes may reflect a combination of pulmonary nodules and infectious/inflammatory consolidation. 3. Innumerable pulmonary nodules, increased in size and conspicuity in comparison to prior study. This is consistent with worsening metastatic disease. 4. Increased RIGHT adrenal nodularity consistent with worsening metastatic disease. 5. Increased size of lytic lesion of the RIGHT aspect of T12 consistent with worsening metastatic disease. 6. Increased mediastinal and supraclavicular adenopathy, consistent with worsening metastatic disease. 7. RIGHT axillary subcutaneous mass has mildly increased in size. This may reflect a epidermal inclusion cyst versus metastatic disease. Correlate with physical exam. Aortic Atherosclerosis (ICD10-I70.0). Electronically Signed   By: Valentino Saxon MD   On: 04/20/2020 10:17   DG Chest Port 1 View  Result Date: 04/14/2020 CLINICAL DATA:  Emesis.  Mental status changes. EXAM: PORTABLE CHEST 1 VIEW COMPARISON:  None. FINDINGS: Midline trachea. Normal heart size. Small left pleural effusion. No pneumothorax. Low lung volumes with resultant pulmonary interstitial prominence. Subtle left base airspace disease. IMPRESSION: Small left pleural effusion with adjacent airspace disease, atelectasis or infection. Electronically Signed   By: Abigail Miyamoto M.D.   On: 05/04/2020 05:26    Assessment:  The patient is a 64 y.o. gentleman with metastatic renal cell carcinoma on lenvatinib and axitinib who is admitted with altered mental status.  He has a history of CNS metastasis s/p SRS in 07/2018, 12/2018,and 05/2019.  Head MRI in 01/2020 was stable.  He was admitted to [facility] from 03/18/2020 - 03/20/2020 with confusion felt secondary to increased doses of lenvatinib.  He was discharged on lenvatinib 12 mg a day.  Chest, abdomen, and pelvis CT from [facility] on 03/18/2020 revealed a stable right renal mass, new  right renal vein extension and occlusion with probable involvement of the IVC.  There was increasing size and number of bilateral pulmonary nodules.  There was a new right adrenal nodule, mildly increasing retroperitoneal adenopathy, and a new subcentimeter peritoneal nodule.  There was stable supraclavicular and mediastinal adenopathy, lytic bone metastasis and right chest wall soft tissue nodule.    Head CT without contrast revealed cerebral and cerebellar atrophy.  There was mild ventriculomegaly felt secondary to cerebral atrophy and mild small vessel ischemic change.  CXR revealed a small left pleural effusion with adjacent airspace disease, atelectasis or infection.  EKG revealed sinus tachycardia  He has a microcytic anemia.  Symptomatically, he denies any complaints.  Short term memory is poor.  He is unaware of his last admission and how he got here today.  Plan:   1.   Metastatic renal cell carcinoma  Patient has a long standing history of metastatic renal cell carcinoma.  He has been on multiple treatment regimens.  He is currently on lenvatinib and axitinib.  CT scan in 03/2020 revealed progressive disease.  Discontinue lenvatinib and axitinib. 2.   Altered mental status  Admission in 03/2020 with AMS felt secondary to increased doses of lenvatinib.  Lenvatinib associated with encephalopathy.  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    Discontinue lenvatinib and axitinib as noted above.  Head MRI secondary to h/o CNS metastasis. 3.   Microcytic anemia  Hematocrit 27.1.  Hemoglobin 7.9.  MCV 76.1.   Check ferritin, iron studies, sed rate.  Diet appears good.  Patient denies any bleeding. 4.  Left lower lobe infiltrate  Patient had emesis and altered mental status worrisome for aspiration.  Patient empirically started on antibiotics (ceftriaxone and azithromycin). 5.   Disposition  Discuss last scans with patient noting  progression.  Discuss plan for f/u with Dr Berline Chough at [facility] at discharge [phone]).   Thank you for allowing me to participate in CALLAN NORDEN 's care.  I will follow him closely with you while hospitalized and after discharge in the outpatient department.   Lequita Asal, MD  04/12/2020, 11:09 AM

## 2020-04-13 NOTE — ED Notes (Signed)
Wife at bedside.

## 2020-04-13 NOTE — ED Notes (Signed)
Provider notified of occult blood card was positive.

## 2020-04-13 NOTE — ED Provider Notes (Signed)
[facility] Emergency Department Provider Note  ____________________________________________   First MD Initiated Contact with Patient 05/03/2020 [phone]     (approximate)  I have reviewed the triage vital signs and the nursing notes.   HISTORY  Chief Complaint  Emesis and Altered Mental Status  Level 5 caveat:  history/ROS limited by altered mental status/confusion   HPI Hunter Davila is a 64 y.o. male  with medical history as listed below (which includes renal cancer, currently undergoing treatment at [facility]) who presents by EMS for evaluation of altered mental status.  The history is somewhat unclear  but according to the patient's friend, the patient had at least an hour of vomiting as well as intermittent confusion over the course of the day.  According to the patient's friend, he stood at the toilet for 45 minutes to an hour, just standing there is up to urinate.  Afterwards he did not know what was going on or what he did.  He claims to me now that he does remember standing at the toilet and remembers being confused.  He seems alert and oriented currently but is not able to provide much additional history.  He says he does not remember vomiting and he denies any nausea.  He denies fever, sore throat, chest pain, shortness of breath, abdominal pain, and dysuria.  Nothing in particular makes the symptoms better or worse and they seem to be severe and relatively acute in onset.  Reportedly he had a similar episode at some point in the past possibly associated with his cancer treatment.  He reports that he is fully vaccinated against COVID-19.        Past Medical History:  Diagnosis Date  . Cancer ([place])   . Diabetes mellitus without complication ([place])   . Hypertension   . Renal disorder    Renal cancer    Patient Active Problem List   Diagnosis Date Noted  . Sepsis ([place]) 04/10/2020  . Acute metabolic encephalopathy 48/88/9169  . Renal cancer ([place])  03/18/2020  . Hypothyroidism 03/18/2020  . Diabetes mellitus without complication ([place])   . Hypertension   . GERD (gastroesophageal reflux disease)   . SIRS (systemic inflammatory response syndrome) ([facility])   . Elevated troponin     History reviewed. No pertinent surgical history.  Prior to Admission medications   Medication Sig Start Date End Date Taking? Authorizing Provider  baclofen (LIORESAL) 10 MG tablet Take 10 mg by mouth 3 (three) times daily.   Yes [provider]  lactulose (CHRONULAC) 10 GM/15ML solution Take 20 g by mouth daily as needed for mild constipation.   Yes [provider]  OLANZapine (ZYPREXA) 5 MG tablet Take 5 mg by mouth at bedtime.   Yes [provider]  amLODipine (NORVASC) 10 MG tablet Take 10 mg by mouth daily. 02/27/20   [provider]  clobetasol (TEMOVATE) 0.05 % external solution Apply 1-2 times per day to the affected scalp areas as directed    [provider]  diclofenac Sodium (VOLTAREN) 1 % GEL Apply 2 g topically 4 (four) times daily.    [provider]  everolimus (AFINITOR) 5 MG tablet Take 5 mg by mouth at bedtime. 03/16/20   [provider]  famotidine (PEPCID) 20 MG tablet Take 20 mg by mouth 2 (two) times daily.    [provider]  glipiZIDE (GLUCOTROL) 5 MG tablet Take 1 tablet (5 mg total) by mouth daily. 03/20/20 03/20/21  Annita Brod,  MD  ketoconazole (NIZORAL) 2 % shampoo Apply 1 application topically 3 (three) times a week. Apply 1-3 times a week on scalp. Let sit 10 minutes then rinse out 12/11/19   [provider]  lenvatinib 14 mg daily dose (LENVIMA) 10 & 4 MG capsule Take 12 mg by mouth daily. Take 1/2 of a 4 mg and 1 x 10 mg capsules (12 mg dose) by mouth once daily 03/20/20   Annita Brod, MD  levothyroxine (SYNTHROID) 150 MCG tablet Take 1 tablet (150 mcg total) by mouth daily. 03/21/20   Annita Brod, MD  oxyCODONE (OXY IR/ROXICODONE) 5 MG  immediate release tablet Take 5 mg by mouth every 4 (four) hours as needed for pain. 03/05/20   [provider]  potassium chloride SA (KLOR-CON) 20 MEQ tablet Take 20 mEq by mouth 2 (two) times daily. 03/12/20   [provider]  senna (SENOKOT) 8.6 MG tablet Take 1 tablet by mouth daily. 11/28/19 11/27/20  [provider]  tamsulosin (FLOMAX) 0.4 MG CAPS capsule Take 0.4 mg by mouth daily. Take 30 minutes after same meal each day 03/17/20   [provider]  triamcinolone cream (KENALOG) 0.1 % Apply topically 2 (two) times daily Apply to affected area with rash ONLY    [provider]    Allergies Patient has no known allergies.  Family History  Problem Relation Age of Onset  . Diabetes Mellitus II Mother   . Prostate cancer Father     Social History Social History   Tobacco Use  . Smoking status: Never Smoker  . Smokeless tobacco: Never Used  Substance Use Topics  . Alcohol use: Not Currently  . Drug use: Not on file    Review of Systems Level 5 caveat:  history/ROS limited by altered mental status/confusion  ____________________________________________   PHYSICAL EXAM:  VITAL SIGNS: ED Triage Vitals  Enc Vitals Group     BP 04/14/2020 0344 (!) 156/96     Pulse Rate 04/29/2020 0344 (!) 115     Resp 04/11/2020 0344 15     Temp 04/14/2020 0344 98.4 F (36.9 C)     Temp Source 04/25/2020 0344 Oral     SpO2 04/30/2020 0344 97 %     Weight 05/01/2020 0345 91.2 kg (201 lb)     Height 04/24/2020 0345 1.88 m (6\' 2" )     Head Circumference --      Peak Flow --      Pain Score 04/26/2020 0345 0     Pain Loc --      Pain Edu? --      Excl. in [place]? --     Constitutional: Alert and oriented, but confused to recent events. Eyes: Conjunctivae are normal.  Head: Atraumatic. Nose: No congestion/rhinnorhea. Mouth/Throat: Patient is wearing a mask. Neck: No stridor.  No meningeal signs.   Cardiovascular: Tachycardia, regular rhythm. Good peripheral  circulation. Grossly normal heart sounds. Respiratory: Normal respiratory effort.  No retractions. Gastrointestinal: Soft and nontender. No distention.  Musculoskeletal: No lower extremity tenderness nor edema. No gross deformities of extremities. Neurologic:  Normal speech and language. No gross focal neurologic deficits are appreciated.  Skin:  Skin is warm, dry and intact. Psychiatric: Mood and affect are somewhat flat and blunted.  ____________________________________________   LABS (all labs ordered are listed, but only abnormal results are displayed)  Labs Reviewed  LACTIC ACID, PLASMA - Abnormal; Notable for the following components:      Result Value  Lactic Acid, Venous 2.4 (*)    All other components within normal limits  COMPREHENSIVE METABOLIC PANEL - Abnormal; Notable for the following components:   CO2 20 (*)    Glucose, Bld 167 (*)    Calcium 8.4 (*)    Albumin 2.7 (*)    All other components within normal limits  CBC WITH DIFFERENTIAL/PLATELET - Abnormal; Notable for the following components:   RBC 3.56 (*)    Hemoglobin 7.9 (*)    HCT 27.1 (*)    MCV 76.1 (*)    MCH 22.2 (*)    MCHC 29.2 (*)    RDW 19.1 (*)    Lymphs Abs 0.6 (*)    All other components within normal limits  RESPIRATORY PANEL BY RT PCR (FLU A&B, COVID)  CULTURE, BLOOD (SINGLE)  URINE CULTURE  CULTURE, BLOOD (SINGLE)  PROTIME-INR  LIPASE, BLOOD  LACTIC ACID, PLASMA  URINALYSIS, COMPLETE (UACMP) WITH MICROSCOPIC  TROPONIN I (HIGH SENSITIVITY)   ____________________________________________  EKG  ED ECG REPORT I, Hinda Kehr, the attending physician, personally viewed and interpreted this ECG.  Date: 04/29/2020 EKG Time: 3:44 AM Rate: 115 Rhythm: Sinus tachycardia QRS Axis: normal Intervals: normal ST/T Wave abnormalities: Non-specific ST segment / T-wave changes, but no clear evidence of acute ischemia. Narrative Interpretation: no definitive evidence of acute ischemia; does  not meet STEMI criteria.   ____________________________________________  RADIOLOGY I, Hinda Kehr, personally viewed and evaluated these images (plain radiographs) as part of my medical decision making, as well as reviewing the written report by the radiologist.  ED MD interpretation: Cerebral atrophy but no evidence of any acute or emergent condition on head CT.  Probable left lower lobe pneumonia on chest x-ray.  Official radiology report(s): CT Head Wo Contrast  Result Date: 04/24/2020 CLINICAL DATA:  Mental status change. EXAM: CT HEAD WITHOUT CONTRAST TECHNIQUE: Contiguous axial images were obtained from the base of the skull through the vertex without intravenous contrast. COMPARISON:  08/27/2019 FINDINGS: Brain: Age advanced cerebral atrophy. Mild ventriculomegaly is similar, favored to be secondary to atrophy. Mild low density in the periventricular white matter likely related to small vessel disease. there is also age advanced cerebellar atrophy. No mass lesion, hemorrhage, acute infarct, intra-axial, or extra-axial fluid collection. Vascular: Intracranial atherosclerosis. Skull: Normal skull. Sinuses/Orbits: Normal imaged portions of the orbits and globes. Clear paranasal sinuses and mastoid air cells. Other: None. IMPRESSION: 1. No acute intracranial abnormality. 2. Cerebral and cerebellar atrophy. Similar mild ventriculomegaly, felt to be secondary to cerebral atrophy. 3. Mild small vessel ischemic change. Electronically Signed   By: Abigail Miyamoto M.D.   On: 04/12/2020 05:57   DG Chest Port 1 View  Result Date: 04/08/2020 CLINICAL DATA:  Emesis.  Mental status changes. EXAM: PORTABLE CHEST 1 VIEW COMPARISON:  None. FINDINGS: Midline trachea. Normal heart size. Small left pleural effusion. No pneumothorax. Low lung volumes with resultant pulmonary interstitial prominence. Subtle left base airspace disease. IMPRESSION: Small left pleural effusion with adjacent airspace disease,  atelectasis or infection. Electronically Signed   By: Abigail Miyamoto M.D.   On: 04/25/2020 05:26    ____________________________________________   PROCEDURES   Procedure(s) performed (including Critical Care):  Procedures   ____________________________________________   INITIAL IMPRESSION / MDM / [place] / ED COURSE  As part of my medical decision making, I reviewed the following data within the [place] notes reviewed and incorporated, Labs reviewed , EKG interpreted , Old chart reviewed, Radiograph reviewed , Notes from  prior ED visits and [place] Controlled Substance Database   Differential diagnosis includes, but is not limited to, sepsis, electrolyte or metabolic abnormality, medication or drug side effect, COVID-19, CVA, acute intracranial bleed.  The patient has no current symptoms but apparently he has been vomiting and confused.  He has no meningismus and no fever.  Mildly tachycardic but this could be due to volume depletion.  Broad evaluation is pending including CT head, chest x-ray, and labs.  EKG shows no sign of ischemia.  The patient is on the cardiac monitor to evaluate for evidence of arrhythmia and/or significant heart rate changes.     Clinical Course as of Apr 14 639  Sun Apr 13, 2020  0434 CBC is essentially at baseline  CBC WITH DIFFERENTIAL(!) [CF]  0503 SARS Coronavirus 2 by RT PCR: NEGATIVE [CF]  0503 Lactic Acid, Venous(!!): 2.4 [CF]  0543 The patient's lactic acid level was up and he remains mildly tachycardic.  Technically speaking he does not meet SIRS criteria and therefore does not meet sepsis criteria, but he also has a chest x-ray that suggested a left lower lobe pneumonia.  I will treat empirically with ceftriaxone 2 g IV and azithromycin 500 mg IV.  He reportedly has had encephalopathy in the past when his chemotherapy has been changed around and he also appears to have evidence of acute infection.  I will admit him  to the hospitalist service given the encephalopathy, the elevated lactic acid, the vomiting, the tachycardia and the apparent pneumonia on chest x-ray.   [CF]  0602 Cerebral atrophy, but no evidence of any acute emergent condition.  As previously explained, I am consulting the hospitalist for admission.  CT Head Wo Contrast [CF]  4268 Discussed case by phone with Dr. Damita Dunnings with the hospitalist service who will admit.   [CF]    Clinical Course User Index [CF] Hinda Kehr, MD     ____________________________________________  FINAL CLINICAL IMPRESSION(S) / ED DIAGNOSES  Final diagnoses:  Encephalopathy acute  Pneumonia of left lower lobe due to infectious organism  Malignant neoplasm of kidney, unspecified laterality ([place])     MEDICATIONS GIVEN DURING THIS VISIT:  Medications  lactated ringers infusion (has no administration in time range)  cefTRIAXone (ROCEPHIN) 2 g in sodium chloride 0.9 % 100 mL IVPB (2 g Intravenous New Bag/Given 04/05/2020 0635)  azithromycin (ZITHROMAX) 500 mg in sodium chloride 0.9 % 250 mL IVPB (has no administration in time range)  lactated ringers bolus 1,000 mL (0 mLs Intravenous Stopped 04/04/2020 0515)     ED Discharge Orders    None      *Please note:  Hunter Davila was evaluated in Emergency Department on 04/10/2020 for the symptoms described in the history of present illness. He was evaluated in the context of the global COVID-19 pandemic, which necessitated consideration that the patient might be at risk for infection with the SARS-CoV-2 virus that causes COVID-19. Institutional protocols and algorithms that pertain to the evaluation of patients at risk for COVID-19 are in a state of rapid change based on information released by regulatory bodies including the CDC and federal and state organizations. These policies and algorithms were followed during the patient's care in the ED.  Some ED evaluations and interventions may be delayed as a  result of limited staffing during and after the pandemic.*  Note:  This document was prepared using Dragon voice recognition software and may include unintentional dictation errors.   Hinda Kehr, MD 04/07/2020 [phone]

## 2020-04-13 NOTE — Progress Notes (Addendum)
Cross Cover Note Patient admitted with Sepsi from CAP now with tachycardia. Previously elevated lactate noted to be secondary to metformin use and med has been held.  Patient now with tachycarda.  CBC noted for drop in HGB 7.9, previously 8.2 one month ago.  BP currently stable Repeat lactate Repeat CBC  Lactate, now normsl.  1 gm drop in hgb since this am  -  No evidence of active bleeding. Likely hemodilution from IV fluid therapy. Will check stool. Protonix added Tachycardia and pale on assessment, will transfuse one unit of blood  Discussed risks and benefits with POA and patient  POA at bedside reports prior to coming to hospital patient had episode while on toilet he stood up and appeared extremely bloated and complained of severe RUQ pain.  [place] she reports patient problem with nausea and vomiting over past couple months.  They both denied seeing blood in vomit or stool and no dark tarry stools.  Has had constipation and takes senokot daily. Last BM this am after getting lactulose  On exam midline ventral abdominal hernia - non acute findings Abdomen soft + right upper quadrant sign. 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 Lovenox has been discontinued SCD for DVT prophylaxis

## 2020-04-13 NOTE — ED Notes (Signed)
Reminded patient multiple times to utilize call bell

## 2020-04-13 NOTE — ED Notes (Signed)
Provider notified of hemoglobin and hematocrit.

## 2020-04-13 NOTE — ED Notes (Signed)
Pt states he is okay with a blood transfusion. POA called and state pt is able to sign own consent. Consents have been signed for blood transfusion. Provider notified.

## 2020-04-13 NOTE — H&P (Addendum)
History and Physical    Hunter Davila YSA:[id number] DOB: 1956/03/11 DOA: 04/21/2020  PCP: Patient, No Pcp Per   Patient coming from: Home  I have personally briefly reviewed patient's old medical records in [place]  Chief Complaint: Change in mental status  HPI: Hunter Davila is a 64 y.o. male with medical history significant for renal cell carcinoma on oral chemotherapy, hypothyroidism, diabetes mellitus, hypertension and GERD who was brought into the ER by EMS for evaluation of mental status changes.  Most of the history was obtained from patient's friend who stated that patient had a prolonged episode of emesis with associated intermittent confusion.  Per patient's friend his home medications were recently changed and they were concerned his mental status changes related to recent medication change. Patient is awake, alert and oriented to person place and time but is unable to tell me why he is in the emergency room. He denies having any fever, no chest pain, no shortness of breath, no abdominal pain, no changes in his bowel habits, no urinary symptoms, no dizziness or lightheadedness. Patient reports that he is fully vaccinated against COVID-19. Labs reveal sodium 135, potassium 4.5, chloride 101, bicarb 20, glucose 167, BUN 12, creatinine 0.9, calcium 8.4, albumin 2.7, lipase 20 AST 26, ALT 10, total protein 7.9, lactic acid 2.4, white count 6.7, hemoglobin 7.9, hematocrit 27.1, MCV 76.9, RDW 19.1, platelet count 352, Respiratory viral panel is negative CT scan of the head without contrast shows cerebral and cerebellar atrophy.  Similar mild ventriculomegaly felt to be secondary to cerebral atrophy.  Mild small vessel ischemic change Chest x-ray reviewed by me shows small left pleural effusion with adjacent airspace disease, atelectasis or infection. Twelve-lead EKG reviewed by me shows sinus tachycardia  ED Course: Patient is a 64 year old Caucasian male with  multiple medical problems who was brought in by EMS for evaluation of emesis and confusion.  Patient appears to be back to his baseline and is oriented to person, place and time but is unable to tell me why he came to the emergency room.  He denies having any constitutional symptoms at this time.  Lactic acid is elevated at 2.5 and chest x-ray shows a possible left lower lobe infiltrate.  Patient will be admitted to the hospital for further evaluation.  Review of Systems: As per HPI otherwise 10 point review of systems negative.    Past Medical History:  Diagnosis Date   Cancer ([place])    Diabetes mellitus without complication ([place])    Hypertension    Renal disorder    Renal cancer    History reviewed. No pertinent surgical history.   reports that he has never smoked. He has never used smokeless tobacco. He reports previous alcohol use. No history on file for drug use.  No Known Allergies  Family History  Problem Relation Age of Onset   Diabetes Mellitus II Mother    Prostate cancer Father      Prior to Admission medications   Medication Sig Start Date End Date Taking? Authorizing Provider  acetaminophen (TYLENOL) 500 MG tablet Take 1 tablet by mouth every 8 (eight) hours as needed.   Yes [provider]  amLODipine (NORVASC) 10 MG tablet Take 10 mg by mouth daily. 02/27/20  Yes [provider]  baclofen (LIORESAL) 10 MG tablet Take 10 mg by mouth 3 (three) times daily.   Yes [provider]  clindamycin (CLEOCIN T) 1 % external solution Apply 1 application topically 2 (two)  times daily.   Yes [provider]  clobetasol (TEMOVATE) 0.05 % external solution Apply 1-2 times per day to the affected scalp areas as directed   Yes [provider]  everolimus (AFINITOR) 5 MG tablet Take 5 mg by mouth at bedtime. 03/16/20  Yes [provider]  famotidine (PEPCID) 20 MG tablet Take 20 mg by mouth 2 (two) times daily as needed.    Yes  [provider]  lactulose (CHRONULAC) 10 GM/15ML solution Take 20 g by mouth daily as needed for mild constipation.   Yes [provider]  lenvatinib 14 mg daily dose (LENVIMA) 10 & 4 MG capsule Take 12 mg by mouth daily. Take 1/2 of a 4 mg and 1 x 10 mg capsules (12 mg dose) by mouth once daily 03/20/20  Yes Annita Brod, MD  levothyroxine (SYNTHROID) 150 MCG tablet Take 1 tablet (150 mcg total) by mouth daily. 03/21/20  Yes Annita Brod, MD  metFORMIN (GLUCOPHAGE) 1000 MG tablet Take 1 tablet by mouth daily. 04/09/20  Yes [provider]  OLANZapine (ZYPREXA) 5 MG tablet Take 5 mg by mouth at bedtime.   Yes [provider]  oxyCODONE (OXY IR/ROXICODONE) 5 MG immediate release tablet Take 5-10 mg by mouth every 4 (four) hours as needed for pain.  03/05/20  Yes [provider]  oxyCODONE (OXYCONTIN) 15 mg 12 hr tablet Take 15 mg by mouth every 12 (twelve) hours. For 14 days 04/08/20 2020-04-28 Yes [provider]  potassium chloride SA (KLOR-CON) 20 MEQ tablet Take 20 mEq by mouth 2 (two) times daily. 03/12/20  Yes [provider]  prochlorperazine (COMPAZINE) 10 MG tablet Take 1 tablet by mouth every 6 (six) hours as needed. 03/26/20  Yes [provider]  senna (SENOKOT) 8.6 MG tablet Take 1 tablet by mouth daily. 11/28/19 11/27/20 Yes [provider]  tamsulosin (FLOMAX) 0.4 MG CAPS capsule Take 0.4 mg by mouth daily. Take 30 minutes after same meal each day 03/17/20  Yes [provider]  triamcinolone cream (KENALOG) 0.1 % Apply topically 2 (two) times daily Apply to affected area with rash ONLY   Yes [provider]  diclofenac Sodium (VOLTAREN) 1 % GEL Apply 2 g topically 4 (four) times daily. Patient not taking: Reported on 05/02/2020    [provider]  glipiZIDE (GLUCOTROL) 5 MG tablet Take 1 tablet (5 mg total) by mouth daily. Patient not taking: Reported on 04/25/2020 03/20/20 03/20/21   Annita Brod, MD  ketoconazole (NIZORAL) 2 % shampoo Apply 1 application topically 3 (three) times a week. Apply 1-3 times a week on scalp. Let sit 10 minutes then rinse out 12/11/19   [provider]    Physical Exam: Vitals:   04/27/2020 0630 05/03/2020 0645 04/26/2020 0700 04/20/2020 0715  BP:      Pulse: (!) 109 (!) 102 (!) 106 95  Resp: 12 18 18 12   Temp:      TempSrc:      SpO2: 98% 100% 99% 99%  Weight:      Height:         Vitals:   04/25/2020 0630 05/01/2020 0645 04/09/2020 0700 04/16/2020 0715  BP:      Pulse: (!) 109 (!) 102 (!) 106 95  Resp: 12 18 18 12   Temp:      TempSrc:      SpO2: 98% 100% 99% 99%  Weight:      Height:  Constitutional: NAD, alert and oriented x 3 Eyes: PERRL, lids and conjunctivae normal ENMT: Mucous membranes are moist.  Neck: normal, supple, no masses, no thyromegaly Respiratory: Scattered rhonchi left base, no wheezing, no crackles. Normal respiratory effort. No accessory muscle use.  Cardiovascular: Regular rate and rhythm, no murmurs / rubs / gallops. No extremity edema. 2+ pedal pulses. No carotid bruits.  Abdomen: no tenderness, no masses palpated. No hepatosplenomegaly. Bowel sounds positive.  Musculoskeletal: no clubbing / cyanosis. No joint deformity upper and lower extremities.  Skin: no rashes, lesions, ulcers.  Neurologic: No gross focal neurologic deficit.  Able to move all his extremities Psychiatric: Normal mood and affect.   Labs on Admission: I have personally reviewed following labs and imaging studies  CBC: Recent Labs  Lab 04/24/2020 0358  WBC 6.7  NEUTROABS 5.3  HGB 7.9*  HCT 27.1*  MCV 76.1*  PLT 409   Basic Metabolic Panel: Recent Labs  Lab 04/12/2020 0358  NA 135  K 4.5  CL 101  CO2 20*  GLUCOSE 167*  BUN 12  CREATININE 0.99  CALCIUM 8.4*   GFR: Estimated Creatinine Clearance: 89.9 mL/min (by C-G formula based on SCr of 0.99 mg/dL). Liver Function Tests: Recent Labs  Lab 04/30/2020 0358    AST 26  ALT 10  ALKPHOS 80  BILITOT 0.3  PROT 7.9  ALBUMIN 2.7*   Recent Labs  Lab 04/05/2020 0358  LIPASE 20   No results for input(s): AMMONIA in the last 168 hours. Coagulation Profile: Recent Labs  Lab 04/21/2020 0358  INR 1.0   Cardiac Enzymes: No results for input(s): CKTOTAL, CKMB, CKMBINDEX, TROPONINI in the last 168 hours. BNP (last 3 results) No results for input(s): PROBNP in the last 8760 hours. HbA1C: No results for input(s): HGBA1C in the last 72 hours. CBG: No results for input(s): GLUCAP in the last 168 hours. Lipid Profile: No results for input(s): CHOL, HDL, LDLCALC, TRIG, CHOLHDL, LDLDIRECT in the last 72 hours. Thyroid Function Tests: No results for input(s): TSH, T4TOTAL, FREET4, T3FREE, THYROIDAB in the last 72 hours. Anemia Panel: No results for input(s): VITAMINB12, FOLATE, FERRITIN, TIBC, IRON, RETICCTPCT in the last 72 hours. Urine analysis:    Component Value Date/Time   COLORURINE YELLOW (A) 03/18/2020 1044   APPEARANCEUR CLOUDY (A) 03/18/2020 1044   LABSPEC 1.023 03/18/2020 1044   PHURINE 5.0 03/18/2020 1044   GLUCOSEU 50 (A) 03/18/2020 1044   HGBUR MODERATE (A) 03/18/2020 1044   BILIRUBINUR NEGATIVE 03/18/2020 1044   KETONESUR NEGATIVE 03/18/2020 1044   PROTEINUR 100 (A) 03/18/2020 1044   NITRITE NEGATIVE 03/18/2020 1044   LEUKOCYTESUR NEGATIVE 03/18/2020 1044    Radiological Exams on Admission: CT Head Wo Contrast  Result Date: 04/15/2020 CLINICAL DATA:  Mental status change. EXAM: CT HEAD WITHOUT CONTRAST TECHNIQUE: Contiguous axial images were obtained from the base of the skull through the vertex without intravenous contrast. COMPARISON:  08/27/2019 FINDINGS: Brain: Age advanced cerebral atrophy. Mild ventriculomegaly is similar, favored to be secondary to atrophy. Mild low density in the periventricular white matter likely related to small vessel disease. there is also age advanced cerebellar atrophy. No mass lesion, hemorrhage,  acute infarct, intra-axial, or extra-axial fluid collection. Vascular: Intracranial atherosclerosis. Skull: Normal skull. Sinuses/Orbits: Normal imaged portions of the orbits and globes. Clear paranasal sinuses and mastoid air cells. Other: None. IMPRESSION: 1. No acute intracranial abnormality. 2. Cerebral and cerebellar atrophy. Similar mild ventriculomegaly, felt to be secondary to cerebral atrophy. 3. Mild small vessel ischemic change. Electronically Signed  By: Abigail Miyamoto M.D.   On: 05/04/2020 05:57   DG Chest Port 1 View  Result Date: 05/02/2020 CLINICAL DATA:  Emesis.  Mental status changes. EXAM: PORTABLE CHEST 1 VIEW COMPARISON:  None. FINDINGS: Midline trachea. Normal heart size. Small left pleural effusion. No pneumothorax. Low lung volumes with resultant pulmonary interstitial prominence. Subtle left base airspace disease. IMPRESSION: Small left pleural effusion with adjacent airspace disease, atelectasis or infection. Electronically Signed   By: Abigail Miyamoto M.D.   On: 04/27/2020 05:26    EKG: Independently reviewed.  Sinus tachycardia  Assessment/Plan Principal Problem:   Acute metabolic encephalopathy Active Problems:   Renal cancer ([facility])   Diabetes mellitus without complication ([facility])   Hypertension   GERD (gastroesophageal reflux disease)   Hypothyroidism   CAP (community acquired pneumonia)   Lactic acidosis   Anemia of chronic disease      Acute metabolic encephalopathy Unclear etiology may be secondary to community-acquired pneumonia versus medication induced Patient had a recent change in the dose of his oral chemotherapy Patient is currently awake, alert and oriented to person place and time but does not remember the events that prompted his visit to the ER We will monitor patient closely We will request oncology input   Community-acquired pneumonia Patient has a left lower lobe infiltrate on chest x-ray and was said to have had prolonged emesis prior to  presenting to the ER He has a normal white count with a left shift, he is afebrile and blood pressure stable. Lactic acid is elevated without any evidence of sepsis at this time We will treat patient empirically with Rocephin and Zithromax Follow-up results of blood cultures    Hypertension Continue amlodipine   History of renal cell carcinoma Hold Everolimus and Lenvatinib per oncology recommendation Follow-up with oncology as an outpatient   Hypothyroidism Continue Synthroid   Lactic acidosis Patient noted to have elevated lactic acid of 2.5 No evidence of sepsis at this time Elevated lactic acid is secondary to Metformin use Hold Metformin   Diabetes mellitus Place patient on consistent carbohydrate diet Sliding scale coverage for glycemic control   BPH Continue Flomax   Anemia of chronic disease Secondary to underlying renal cell carcinoma Monitor H&H closely      DVT prophylaxis: Lovenox Code Status: DO NOT RESUSCITATE Family Communication: Greater than 50% of time was spent discussing plan of care with patient's healthcare power of attorney at the bedside, Rozetta Nunnery. All questions and concerns have been addressed. She verbalizes understanding and agrees with the plan of care. CODE STATUS was discussed and he is a DO NOT RESUSCITATE Disposition Plan: Back to previous home environment Consults called: Oncology    Collier Bullock MD [facility]     04/21/2020, 8:55 AM

## 2020-04-13 NOTE — ED Notes (Addendum)
Patient attempting to get out of bed, disoriented to place and situation. Patient stating he needs to use restroom, this RN attempted to assist patient to restroom and patient urinated in floor. MD Karma Greaser made aware.

## 2020-04-13 NOTE — ED Triage Notes (Signed)
Per EMS per patient's friend, states that patient had hour-long episode of emesis. Patient also having intermittent confusion, noted with EMS. Alert and oriented at time of arrival. Pt's friend per EMS states patient's home medications were recently changed. Patient denies pain, does not recall episode of emesis.

## 2020-04-13 NOTE — ED Notes (Signed)
Date and time results received: 04/16/2020 0510  Test: Lactic Critical Value: 2.4  Name of Provider Notified: Karma Greaser MD

## 2020-04-14 ENCOUNTER — Inpatient Hospital Stay: Payer: Medicare Other

## 2020-04-14 DIAGNOSIS — J690 Pneumonitis due to inhalation of food and vomit: Secondary | ICD-10-CM | POA: Diagnosis not present

## 2020-04-14 DIAGNOSIS — R627 Adult failure to thrive: Secondary | ICD-10-CM

## 2020-04-14 DIAGNOSIS — M549 Dorsalgia, unspecified: Secondary | ICD-10-CM | POA: Diagnosis not present

## 2020-04-14 DIAGNOSIS — D500 Iron deficiency anemia secondary to blood loss (chronic): Secondary | ICD-10-CM

## 2020-04-14 DIAGNOSIS — I6381 Other cerebral infarction due to occlusion or stenosis of small artery: Secondary | ICD-10-CM

## 2020-04-14 DIAGNOSIS — E43 Unspecified severe protein-calorie malnutrition: Secondary | ICD-10-CM

## 2020-04-14 DIAGNOSIS — I639 Cerebral infarction, unspecified: Secondary | ICD-10-CM | POA: Diagnosis not present

## 2020-04-14 DIAGNOSIS — G9341 Metabolic encephalopathy: Secondary | ICD-10-CM | POA: Diagnosis not present

## 2020-04-14 LAB — COMPREHENSIVE METABOLIC PANEL
ALT: 9 U/L (ref 0–44)
AST: 24 U/L (ref 15–41)
Albumin: 2.3 g/dL — ABNORMAL LOW (ref 3.5–5.0)
Alkaline Phosphatase: 69 U/L (ref 38–126)
Anion gap: 11 (ref 5–15)
BUN: 10 mg/dL (ref 8–23)
CO2: 24 mmol/L (ref 22–32)
Calcium: 8.4 mg/dL — ABNORMAL LOW (ref 8.9–10.3)
Chloride: 106 mmol/L (ref 98–111)
Creatinine, Ser: 0.99 mg/dL (ref 0.61–1.24)
GFR, Estimated: >60 mL/min (ref >60)
Glucose, Bld: 108 mg/dL — ABNORMAL HIGH (ref 70–99)
Potassium: 4 mmol/L (ref 3.5–5.1)
Sodium: 141 mmol/L (ref 135–145)
Total Bilirubin: 0.3 mg/dL (ref 0.3–1.2)
Total Protein: 7.2 g/dL (ref 6.5–8.1)

## 2020-04-14 LAB — CBC
HCT: 25.4 % — ABNORMAL LOW (ref 39.0–52.0)
Hemoglobin: 7.5 g/dL — ABNORMAL LOW (ref 13.0–17.0)
MCH: 22.1 pg — ABNORMAL LOW (ref 26.0–34.0)
MCHC: 29.5 g/dL — ABNORMAL LOW (ref 30.0–36.0)
MCV: 74.9 fL — ABNORMAL LOW (ref 80.0–100.0)
Platelets: 349 10*3/uL (ref 150–400)
RBC: 3.39 MIL/uL — ABNORMAL LOW (ref 4.22–5.81)
RDW: 19.3 % — ABNORMAL HIGH (ref 11.5–15.5)
WBC: 4.6 10*3/uL (ref 4.0–10.5)
nRBC: 0 % (ref 0.0–0.2)

## 2020-04-14 LAB — URINE CULTURE: Culture: NO GROWTH

## 2020-04-14 LAB — SEDIMENTATION RATE: Sed Rate: 128 mm/hr — ABNORMAL HIGH (ref 0–20)

## 2020-04-14 LAB — ETHANOL: Alcohol, Ethyl (B): <10 mg/dL (ref <10)

## 2020-04-14 LAB — VITAMIN B12: Vitamin B-12: 305 pg/mL (ref 180–914)

## 2020-04-14 LAB — IRON AND TIBC
Iron: 33 ug/dL — ABNORMAL LOW (ref 45–182)
Saturation Ratios: 15 % — ABNORMAL LOW (ref 17.9–39.5)
TIBC: 217 ug/dL — ABNORMAL LOW (ref 250–450)
UIBC: 184 ug/dL

## 2020-04-14 LAB — PREPARE RBC (CROSSMATCH)

## 2020-04-14 LAB — ABO/RH: ABO/RH(D): A POS

## 2020-04-14 LAB — GLUCOSE, CAPILLARY
Glucose-Capillary: 122 mg/dL — ABNORMAL HIGH (ref 70–99)
Glucose-Capillary: 135 mg/dL — ABNORMAL HIGH (ref 70–99)
Glucose-Capillary: 141 mg/dL — ABNORMAL HIGH (ref 70–99)
Glucose-Capillary: 102 mg/dL — ABNORMAL HIGH (ref 70–99)

## 2020-04-14 LAB — FERRITIN: Ferritin: 188 ng/mL (ref 24–336)

## 2020-04-14 MED ORDER — SENNA 8.6 MG PO TABS
2.0000 | ORAL_TABLET | Freq: Two times a day (BID) | ORAL | Status: DC
  Administered 2020-04-14 – 2020-04-17 (×6): 17.2 mg via ORAL
  Filled 2020-04-14 (×6): qty 2

## 2020-04-14 MED ORDER — SODIUM CHLORIDE 0.9 % IV SOLN
3.0000 g | Freq: Four times a day (QID) | INTRAVENOUS | Status: DC
  Administered 2020-04-14 – 2020-04-15 (×4): 3 g via INTRAVENOUS
  Filled 2020-04-14 (×2): qty 3
  Filled 2020-04-14 (×4): qty 8
  Filled 2020-04-14: qty 3
  Filled 2020-04-14: qty 8

## 2020-04-14 MED ORDER — HALOPERIDOL LACTATE 5 MG/ML IJ SOLN: 5.0000 mg | INTRAMUSCULAR | Status: DC

## 2020-04-14 MED ORDER — OXYCODONE-ACETAMINOPHEN 5-325 MG PO TABS
1.0000 | ORAL_TABLET | ORAL | Status: DC | PRN
  Administered 2020-04-14 – 2020-04-16 (×3): 1 via ORAL
  Filled 2020-04-14 (×3): qty 1

## 2020-04-14 MED ORDER — QUETIAPINE FUMARATE 25 MG PO TABS
50.0000 mg | ORAL_TABLET | Freq: Every day | ORAL | Status: DC
  Administered 2020-04-14 – 2020-04-16 (×3): 50 mg via ORAL
  Filled 2020-04-14 (×3): qty 2

## 2020-04-14 MED ORDER — OXYCODONE HCL ER 10 MG PO T12A
20.0000 mg | EXTENDED_RELEASE_TABLET | Freq: Two times a day (BID) | ORAL | Status: DC
  Administered 2020-04-14 – 2020-04-17 (×7): 20 mg via ORAL
  Filled 2020-04-14 (×7): qty 2

## 2020-04-14 MED ORDER — HALOPERIDOL LACTATE 5 MG/ML IJ SOLN
INTRAMUSCULAR | Status: AC
  Administered 2020-04-14: 5 mg via INTRAVENOUS
  Filled 2020-04-14: qty 1

## 2020-04-14 MED ORDER — HALOPERIDOL LACTATE 5 MG/ML IJ SOLN
5.0000 mg | Freq: Four times a day (QID) | INTRAMUSCULAR | Status: DC | PRN
  Administered 2020-04-14: 5 mg via INTRAVENOUS
  Filled 2020-04-14: qty 1

## 2020-04-14 MED ORDER — SODIUM CHLORIDE 0.9 % IV SOLN
300.0000 mg | Freq: Once | INTRAVENOUS | Status: AC
  Administered 2020-04-14: 300 mg via INTRAVENOUS
  Filled 2020-04-14: qty 15

## 2020-04-14 NOTE — Progress Notes (Signed)
Report called to June, RN

## 2020-04-14 NOTE — ED Notes (Signed)
Pt urinated in bed. Pt cleaned, depends placed and new chux placed on pt.

## 2020-04-14 NOTE — ED Notes (Signed)
Admit provider notified: "I just started the blood on this pt. Pt was asleep and he woke up very confused, only oriented to himself. He was taking off monitors and was very agitated. he is now laying in bed, back to sleep. Blood is infusing."

## 2020-04-14 NOTE — ED Notes (Signed)
Rate of PRBCs increased to 271ml/hr

## 2020-04-14 NOTE — ED Notes (Signed)
Pt is up. POA called and pt is talking on phone to her

## 2020-04-14 NOTE — Consult Note (Signed)
Reason for Consult:Acute infarct Requesting Physician: Roosevelt Locks  CC: Altered mental status  I have been asked by Dr. Roosevelt Locks to see this patient in consultation for acute infarct.  HPI: Hunter Davila is an 64 y.o. male with medical history significant forrenal cell carcinoma on oral chemotherapy, hypothyroidism, diabetes mellitus, hypertension and GERD who was brought into the ER by EMS for evaluation of mental status changes. Per patient friend, his the POA, patient has not been sleeping at night for a while.  He has severe right flank pain, he was taking pain medicine, but is not working.  The pain is worse at night, he frequently get up at night taking shower to try to reduce the pain.  Recently, he was seen by palliative care, he was started on long-acting pain medicine as well as as needed pain medicine.  Still cannot sleep well.  His mental status has been gradually getting worse over the last few months but acutely became worse on 10/9.  He is easily confused, but sometimes his mind is clear. It seems the patient's first admission for altered mental status was a few months ago and was felt to be secondary to the dose of his chemotherapy.  Dose was decreased after which he seemed to do better with his mental status but his cancer worsened.  Dose has been gradually increased since that time and patient has had worsening mental status during this period of time.  Patient ha a history of left occipital brain metastasis and a punctate area of concern that has been followed in the left posterior temporal area.  Imaging in July of this year shows no change.     Past Medical History:  Diagnosis Date  . Cancer ([place])   . Diabetes mellitus without complication ([place])   . Hypertension   . Renal disorder    Renal cancer    History reviewed. No pertinent surgical history.  Family History  Problem Relation Age of Onset  . Diabetes Mellitus II Mother   . Prostate cancer Father     Social  History:  reports that he has never smoked. He has never used smokeless tobacco. He reports previous alcohol use. No history on file for drug use.  No Known Allergies  Medications: I have reviewed the patient's current medications. Prior to Admission medications   Medication Sig Start Date End Date Taking? Authorizing Provider  acetaminophen (TYLENOL) 500 MG tablet Take 1 tablet by mouth every 8 (eight) hours as needed.   Yes [provider]  amLODipine (NORVASC) 10 MG tablet Take 10 mg by mouth daily. 02/27/20  Yes [provider]  baclofen (LIORESAL) 10 MG tablet Take 10 mg by mouth 3 (three) times daily.   Yes [provider]  clindamycin (CLEOCIN T) 1 % external solution Apply 1 application topically 2 (two) times daily.   Yes [provider]  clobetasol (TEMOVATE) 0.05 % external solution Apply 1-2 times per day to the affected scalp areas as directed   Yes [provider]  everolimus (AFINITOR) 5 MG tablet Take 5 mg by mouth at bedtime. 03/16/20  Yes [provider]  famotidine (PEPCID) 20 MG tablet Take 20 mg by mouth 2 (two) times daily as needed.    Yes [provider]  lactulose (CHRONULAC) 10 GM/15ML solution Take 20 g by mouth daily as needed for mild constipation.   Yes [provider]  lenvatinib 14 mg daily dose (LENVIMA) 10 & 4 MG capsule Take 12 mg by  mouth daily. Take 1/2 of a 4 mg and 1 x 10 mg capsules (12 mg dose) by mouth once daily 03/20/20  Yes Annita Brod, MD  levothyroxine (SYNTHROID) 150 MCG tablet Take 1 tablet (150 mcg total) by mouth daily. 03/21/20  Yes Annita Brod, MD  metFORMIN (GLUCOPHAGE) 1000 MG tablet Take 1 tablet by mouth daily. 04/09/20  Yes [provider]  OLANZapine (ZYPREXA) 5 MG tablet Take 5 mg by mouth at bedtime.   Yes [provider]  oxyCODONE (OXY IR/ROXICODONE) 5 MG immediate release tablet Take 5-10 mg by mouth every 4 (four) hours as needed for  pain.  03/05/20  Yes [provider]  oxyCODONE (OXYCONTIN) 15 mg 12 hr tablet Take 15 mg by mouth every 12 (twelve) hours. For 14 days 04/08/20 May 16, 2020 Yes [provider]  potassium chloride SA (KLOR-CON) 20 MEQ tablet Take 20 mEq by mouth 2 (two) times daily. 03/12/20  Yes [provider]  prochlorperazine (COMPAZINE) 10 MG tablet Take 1 tablet by mouth every 6 (six) hours as needed. 03/26/20  Yes [provider]  senna (SENOKOT) 8.6 MG tablet Take 1 tablet by mouth daily. 11/28/19 11/27/20 Yes [provider]  tamsulosin (FLOMAX) 0.4 MG CAPS capsule Take 0.4 mg by mouth daily. Take 30 minutes after same meal each day 03/17/20  Yes [provider]  triamcinolone cream (KENALOG) 0.1 % Apply topically 2 (two) times daily Apply to affected area with rash ONLY   Yes [provider]  diclofenac Sodium (VOLTAREN) 1 % GEL Apply 2 g topically 4 (four) times daily. Patient not taking: Reported on 04/15/2020    [provider]  glipiZIDE (GLUCOTROL) 5 MG tablet Take 1 tablet (5 mg total) by mouth daily. Patient not taking: Reported on 04/07/2020 03/20/20 03/20/21  Annita Brod, MD  ketoconazole (NIZORAL) 2 % shampoo Apply 1 application topically 3 (three) times a week. Apply 1-3 times a week on scalp. Let sit 10 minutes then rinse out 12/11/19   [provider]    ROS: History obtained from the patient  General ROS: negative for - chills, fatigue, fever, night sweats, weight gain or weight loss Psychological ROS: confusion Ophthalmic ROS: negative for - blurry vision, double vision, eye pain or loss of vision ENT ROS: negative for - epistaxis, nasal discharge, oral lesions, sore throat, tinnitus or vertigo Allergy and Immunology ROS: negative for - hives or itchy/watery eyes Hematological and Lymphatic ROS: negative for - bleeding problems, bruising or swollen lymph nodes Endocrine ROS: negative for - galactorrhea, hair  pattern changes, polydipsia/polyuria or temperature intolerance Respiratory ROS: negative for - cough, hemoptysis, shortness of breath or wheezing Cardiovascular ROS: negative for - chest pain, dyspnea on exertion, edema or irregular heartbeat Gastrointestinal ROS: abdominal pain Genito-Urinary ROS: negative for - dysuria, hematuria, incontinence or urinary frequency/urgency Musculoskeletal ROS: as noted in HPI Neurological ROS: as noted in HPI Dermatological ROS: negative for rash and skin lesion changes  Physical Examination: Blood pressure (!) 158/102, pulse (!) 107, temperature 98.5 F (36.9 C), temperature source Oral, resp. rate 17, height 6\' 2"  (1.88 m), weight 91.2 kg, SpO2 97 %.  HEENT-  Normocephalic, no lesions, without obvious abnormality.  Normal external eye and conjunctiva.  Normal TM's bilaterally.  Normal auditory canals and external ears. Normal external nose, mucus membranes and septum.  Normal pharynx. Cardiovascular- S1, S2 normal, pulses palpable throughout   Lungs- chest clear, no wheezing, rales, normal symmetric air entry, Heart exam - S1, S2  normal, no murmur, no gallop, rate regular Abdomen- soft, non-tender; bowel sounds normal; no masses,  no organomegaly Extremities- BLE edema Lymph-no adenopathy palpable Musculoskeletal-back pain to palpation Skin-warm and dry, no hyperpigmentation, vitiligo, or suspicious lesions  Neurological Examination   Mental Status: Alert, oriented to name and place.  Speech fluent without evidence of aphasia.  Able to follow 3 step commands without difficulty.  Restless Cranial Nerves: II: Visual fields grossly normal, pupils equal, round, reactive to light and accommodation III,IV, VI: ptosis not present, extra-ocular motions intact bilaterally V,VII: smile symmetric, facial light touch sensation normal bilaterally VIII: hearing normal bilaterally IX,X: gag reflex present XI: bilateral shoulder shrug XII: midline tongue  extension Motor: Right : Upper extremity   5/5    Left:     Upper extremity   5/5  Lower extremity   4/5     Lower extremity   5/5 Tone and bulk:normal tone throughout; no atrophy noted Sensory: Pinprick and light touch intact throughout, bilaterally Deep Tendon Reflexes: 2+ in the upper extremities, 1+ KJ's and absent AJ's bilaterally Plantars: Right: mute   Left: mute Cerebellar: Normal finger-to-nose and normal heel-to-shin testing bilaterally Gait: not tested due to safety concerns    Laboratory Studies:   Basic Metabolic Panel: Recent Labs  Lab 04/10/2020 0358 04/14/20 0150  NA 135 141  K 4.5 4.0  CL 101 106  CO2 20* 24  GLUCOSE 167* 108*  BUN 12 10  CREATININE 0.99 0.99  CALCIUM 8.4* 8.4*    Liver Function Tests: Recent Labs  Lab 04/06/2020 0358 04/14/20 0150  AST 26 24  ALT 10 9  ALKPHOS 80 69  BILITOT 0.3 0.3  PROT 7.9 7.2  ALBUMIN 2.7* 2.3*   Recent Labs  Lab 04/21/2020 0358  LIPASE 20   No results for input(s): AMMONIA in the last 168 hours.  CBC: Recent Labs  Lab 04/14/2020 0358 04/17/2020 1956 04/14/20 0150  WBC 6.7 5.0 4.6  NEUTROABS 5.3 3.8  --   HGB 7.9* 6.9* 7.5*  HCT 27.1* 23.3* 25.4*  MCV 76.1* 74.9* 74.9*  PLT 352 323 349    Cardiac Enzymes: No results for input(s): CKTOTAL, CKMB, CKMBINDEX, TROPONINI in the last 168 hours.  BNP: Invalid input(s): POCBNP  CBG: Recent Labs  Lab 04/10/2020 1811 04/28/2020 1934 04/12/2020 2139 04/14/20 0758 04/14/20 1222  GLUCAP 94 140* 123* 122* 135*    Microbiology: Results for orders placed or performed during the hospital encounter of 04/14/2020  Blood culture (routine single)     Status: None (Preliminary result)   Collection Time: 04/25/2020  3:58 AM   Specimen: BLOOD  Result Value Ref Range Status   Specimen Description BLOOD BLOOD RIGHT FOREARM  Final   Special Requests   Final    BOTTLES DRAWN AEROBIC AND ANAEROBIC Blood Culture results may not be optimal due to an excessive volume of blood  received in culture bottles   Culture   Final    NO GROWTH 1 DAY Performed at [facility], [address], [place], [place] [address]    Report Status PENDING  Incomplete  Respiratory Panel by RT PCR (Flu A&B, Covid) - Nasopharyngeal Swab     Status: None   Collection Time: 04/05/2020  3:58 AM   Specimen: Nasopharyngeal Swab  Result Value Ref Range Status   SARS Coronavirus 2 by RT PCR NEGATIVE NEGATIVE Final    Comment: (NOTE) SARS-CoV-2 target nucleic acids are NOT DETECTED.  The SARS-CoV-2 RNA is generally detectable in upper  respiratoy specimens during the acute phase of infection. The lowest concentration of SARS-CoV-2 viral copies this assay can detect is 131 copies/mL. A negative result does not preclude SARS-Cov-2 infection and should not be used as the sole basis for treatment or other patient management decisions. A negative result may occur with  improper specimen collection/handling, submission of specimen other than nasopharyngeal swab, presence of viral mutation(s) within the areas targeted by this assay, and inadequate number of viral copies (<131 copies/mL). A negative result must be combined with clinical observations, patient history, and epidemiological information. The expected result is Negative.  Fact Sheet for Patients:  PinkCheek.be  Fact Sheet for Healthcare Providers:  GravelBags.it  This test is no t yet approved or cleared by the [place] FDA and  has been authorized for detection and/or diagnosis of SARS-CoV-2 by FDA under an Emergency Use Authorization (EUA). This EUA will remain  in effect (meaning this test can be used) for the duration of the COVID-19 declaration under Section 564(b)(1) of the Act, 21 U.S.C. section 360bbb-3(b)(1), unless the authorization is terminated or revoked sooner.     Influenza A by PCR NEGATIVE NEGATIVE Final   Influenza B by PCR NEGATIVE  NEGATIVE Final    Comment: (NOTE) The Xpert Xpress SARS-CoV-2/FLU/RSV assay is intended as an aid in  the diagnosis of influenza from Nasopharyngeal swab specimens and  should not be used as a sole basis for treatment. Nasal washings and  aspirates are unacceptable for Xpert Xpress SARS-CoV-2/FLU/RSV  testing.  Fact Sheet for Patients: PinkCheek.be  Fact Sheet for Healthcare Providers: GravelBags.it  This test is not yet approved or cleared by the [place] FDA and  has been authorized for detection and/or diagnosis of SARS-CoV-2 by  FDA under an Emergency Use Authorization (EUA). This EUA will remain  in effect (meaning this test can be used) for the duration of the  Covid-19 declaration under Section 564(b)(1) of the Act, 21  U.S.C. section 360bbb-3(b)(1), unless the authorization is  terminated or revoked. Performed at [facility], [address] [place], [place] [address]   Culture, blood (single)     Status: None (Preliminary result)   Collection Time: 04/19/2020  1:36 PM   Specimen: BLOOD  Result Value Ref Range Status   Specimen Description BLOOD LEFT AC  Final   Special Requests   Final    BOTTLES DRAWN AEROBIC ONLY Blood Culture adequate volume   Culture   Final    NO GROWTH < 24 HOURS Performed at [facility], [address], [place], [place] [address]    Report Status PENDING  Incomplete  Urine culture     Status: None   Collection Time: 04/07/2020  3:10 PM   Specimen: Urine, Random  Result Value Ref Range Status   Specimen Description   Final    URINE, RANDOM Performed at [facility], [address], [place], [place] [address]    Special Requests   Final    NONE Performed at [facility], [address] [place], [place] [address]    Culture   Final    NO GROWTH Performed at [facility], [place] [address], [place], [place] [address]    Report Status  04/14/2020 FINAL  Final    Coagulation Studies: Recent Labs    04/12/2020 0358  LABPROT 13.1  INR 1.0    Urinalysis:  Recent Labs  Lab 05/01/2020 1510  COLORURINE STRAW*  LABSPEC 1.004*  PHURINE 6.0  GLUCOSEU NEGATIVE  HGBUR MODERATE*  BILIRUBINUR NEGATIVE  KETONESUR NEGATIVE  PROTEINUR 30*  NITRITE NEGATIVE  LEUKOCYTESUR NEGATIVE    Lipid Panel:     Component Value Date/Time   CHOL 258 (H) 03/19/2020 0406   TRIG 183 (H) 03/19/2020 0406   HDL 53 03/19/2020 0406   CHOLHDL 4.9 03/19/2020 0406   VLDL 37 03/19/2020 0406   LDLCALC 168 (H) 03/19/2020 0406    HgbA1C:  Lab Results  Component Value Date   HGBA1C 7.3 (H) 03/19/2020    Urine Drug Screen:      Component Value Date/Time   LABOPIA NONE DETECTED 04/05/2020 1510   COCAINSCRNUR NONE DETECTED 05/01/2020 1510   LABBENZ NONE DETECTED 04/29/2020 1510   AMPHETMU NONE DETECTED 04/14/2020 1510   THCU NONE DETECTED 04/12/2020 1510   LABBARB NONE DETECTED 04/15/2020 1510    Alcohol Level: No results for input(s): ETH in the last 168 hours.  Other results: EKG: sinus tachycardia at 115 bpm.  Imaging: CT Head Wo Contrast  Result Date: 04/20/2020 CLINICAL DATA:  Mental status change. EXAM: CT HEAD WITHOUT CONTRAST TECHNIQUE: Contiguous axial images were obtained from the base of the skull through the vertex without intravenous contrast. COMPARISON:  08/27/2019 FINDINGS: Brain: Age advanced cerebral atrophy. Mild ventriculomegaly is similar, favored to be secondary to atrophy. Mild low density in the periventricular white matter likely related to small vessel disease. there is also age advanced cerebellar atrophy. No mass lesion, hemorrhage, acute infarct, intra-axial, or extra-axial fluid collection. Vascular: Intracranial atherosclerosis. Skull: Normal skull. Sinuses/Orbits: Normal imaged portions of the orbits and globes. Clear paranasal sinuses and mastoid air cells. Other: None. IMPRESSION: 1. No acute intracranial  abnormality. 2. Cerebral and cerebellar atrophy. Similar mild ventriculomegaly, felt to be secondary to cerebral atrophy. 3. Mild small vessel ischemic change. Electronically Signed   By: Abigail Miyamoto M.D.   On: 04/26/2020 05:57   CT CHEST WO CONTRAST  Result Date: 04/04/2020 CLINICAL DATA:  Pleural effusion. History of metastatic renal cell carcinoma. EXAM: CT CHEST WITHOUT CONTRAST TECHNIQUE: Multidetector CT imaging of the chest was performed following the standard protocol without IV contrast. COMPARISON:  March 18, 2020, April 13, 2020 FINDINGS: Cardiovascular: Three-vessel coronary artery atherosclerotic calcifications. Heart is at the upper limits of normal in size. Scattered atherosclerotic calcifications throughout the aorta. No pericardial effusion. Mediastinum/Nodes: No axillary adenopathy. RIGHT supraclavicular lymph node measures 10 mm in the short axis, previously 8 mm (series 2, image 11). LEFT supraclavicular lymph node measures 10 mm in the short axis, previously 8 mm (series 2, image 14). RIGHT paratracheal lymph node measures 10 mm, previously 8 mm (series 2, image 47). Additional RIGHT paratracheal lymph node measures 11 mm in the short axis, previously 10 mm (series 2, image 53). Lymph node posterior to the esophagus measures 8 mm, previously 5 mm (series 2, image 74). Lungs/Pleura: Moderate LEFT and trace RIGHT pleural effusion. There is irregular consolidative opacity of the LEFT lower lobe, new since prior. There is scattered irregular consolidative opacity of the RIGHT lower lobe which may reflect a combination of pulmonary nodules and consolidation. There are innumerable pulmonary nodules. Many of these nodules are relatively ill-defined with central lucency. The size and conspicuity of pulmonary nodules has increased in comparison to prior. Representative nodules are as follows: Nodule 1: LEFT upper lobe ill-defined nodule with central lucency measures 12 mm, previously 10 mm  (series 2, image 42). Nodule 2: LEFT upper lobe ill-defined nodule with central lucency measures 9 mm, previously 6  mm (series 2, image 66). Nodule 3: RIGHT lower lobe ill-defined nodule with central lucency measures 7 mm, previously 4 mm (series 2, image 63). Upper Abdomen: Increased RIGHT adrenal nodularity measuring 3.3 x 1.9 cm, previously 2.8 x 1.4 cm (series 2, image 148). Infiltrative RIGHT renal mass is poorly visualized on current exam. Musculoskeletal: 15 mm RIGHT axillary subcutaneous mass, previously 14 mm. Mild degenerative changes of the thoracic spine. There is a lytic lesion of the RIGHT aspect of T12, increased in comparison to prior IMPRESSION: 1. Moderate LEFT and trace RIGHT pleural effusions. 2. Irregular consolidative opacity of the LEFT greater than RIGHT lower lobes may reflect a combination of pulmonary nodules and infectious/inflammatory consolidation. 3. Innumerable pulmonary nodules, increased in size and conspicuity in comparison to prior study. This is consistent with worsening metastatic disease. 4. Increased RIGHT adrenal nodularity consistent with worsening metastatic disease. 5. Increased size of lytic lesion of the RIGHT aspect of T12 consistent with worsening metastatic disease. 6. Increased mediastinal and supraclavicular adenopathy, consistent with worsening metastatic disease. 7. RIGHT axillary subcutaneous mass has mildly increased in size. This may reflect a epidermal inclusion cyst versus metastatic disease. Correlate with physical exam. Aortic Atherosclerosis (ICD10-I70.0). Electronically Signed   By: Valentino Saxon MD   On: 04/24/2020 10:17   MR BRAIN W WO CONTRAST  Result Date: 04/05/2020 CLINICAL DATA:  Initial evaluation for acute delirium. History of renal cell carcinoma. EXAM: MRI HEAD WITHOUT AND WITH CONTRAST TECHNIQUE: Multiplanar, multiecho pulse sequences of the brain and surrounding structures were obtained without and with intravenous contrast.  CONTRAST:  7.68mL GADAVIST GADOBUTROL 1 MMOL/ML IV SOLN COMPARISON:  Prior head CT from earlier the same day as well as previous MRI from 08/27/2019. FINDINGS: Brain: Examination degraded by motion artifact. Diffuse prominence of the CSF containing spaces compatible generalized cerebral atrophy. Mild patchy T2/FLAIR hyperintensity within the periventricular white matter, nonspecific, but most like related chronic microvascular ischemic disease, mild for age. 8 mm focus of diffusion abnormality seen involving the deep white matter of the posterior left frontal centrum semi ovale, consistent with an acute to subacute ischemic infarct (series 5, image 37). No associated hemorrhage or mass effect. No other evidence for acute or subacute ischemia. Again seen is a focus of susceptibility artifact at the left occipital pole, suggesting an underlying microhemorrhage (series 13, image 24). This was also seen on prior brain MRI from 08/27/2019. However, on today's exam, now seen is a small amount of T2/FLAIR signal abnormality suggestive of vasogenic edema (series 15, image 21), not seen on prior exam. No definite associated enhancement evident on this motion degraded exam. A possible small cavernoma that has bled in the interim with associated edema could also be considered. Additional foci of susceptibility artifact involving the left periatrial white matter (series 13, image 30) also seen, stable from previous. There is a small focus of associated nodular enhancement about this lesion (series 18, image 13), stable. No significant surrounding vasogenic edema. Again, this could reflect a small cavernoma, although a small metastatic lesion is not entirely excluded. Additional focus of susceptibility artifact at the right cerebellum also unchanged (series 13, image 17). This lesion demonstrates no appreciable enhancement or associated edema. No other mass lesion, mass effect, or midline shift. Mild ventricular prominence  related to global parenchymal volume loss without hydrocephalus. No extra-axial fluid collection. Pituitary gland suprasellar region within normal limits. Midline structures intact. No other abnormal enhancement. Vascular: Major intracranial vascular flow voids are maintained. Skull and upper cervical spine: Craniocervical  junction within normal limits. Bone marrow signal intensity normal. No focal marrow replacing lesion. No scalp soft tissue abnormality. Sinuses/Orbits: Globes and orbital soft tissues within normal limits. Mild mucosal thickening noted within the maxillary sinuses and ethmoidal air cells. Paranasal sinuses are otherwise clear. No mastoid effusion. Inner ear structures grossly normal. Other: None. IMPRESSION: 1. 8 mm acute to subacute ischemic infarct involving the deep white matter of the posterior left frontal centrum semi ovale. No associated hemorrhage or mass effect. 2. Small foci of susceptibility artifact involving the left occipital pole with new small volume surrounding T2/FLAIR signal abnormality, suggesting vasogenic edema. Finding is indeterminate, and could reflect a small cavernoma that has bled in the interim. A small metastatic lesion could also be considered. A short interval follow-up MRI in 2-3 months or earlier if warranted is recommended for further evaluation. 3. Additional focus of susceptibility artifact at the left periatrial white matter with associated punctate nodular enhancement, stable. Again, finding may reflect a small cavernoma or possibly metastasis. Attention at follow-up recommended. Additional focus of susceptibility artifact at the right cerebellum without associated enhancement or edema is unchanged. 4. Generalized age-related cerebral atrophy with mild chronic microvascular ischemic disease. Electronically Signed   By: Jeannine Boga M.D.   On: 04/16/2020 22:05   DG Chest Port 1 View  Result Date: 04/05/2020 CLINICAL DATA:  Emesis.  Mental status  changes. EXAM: PORTABLE CHEST 1 VIEW COMPARISON:  None. FINDINGS: Midline trachea. Normal heart size. Small left pleural effusion. No pneumothorax. Low lung volumes with resultant pulmonary interstitial prominence. Subtle left base airspace disease. IMPRESSION: Small left pleural effusion with adjacent airspace disease, atelectasis or infection. Electronically Signed   By: Abigail Miyamoto M.D.   On: 04/12/2020 05:26     Assessment/Plan:  64 y.o. male with medical history significant forrenal cell carcinoma on oral chemotherapy, hypothyroidism, diabetes mellitus, hypertension and GERD who was brought into the ER by EMS for evaluation of mental status changes.  His mental status has been gradually getting worse over the last few months but acutely became worse on 10/9.  He is easily confused, but sometimes his mind is clear. It seems the patient's first admission for altered mental status was a few months ago and was felt to be secondary to the dose of his chemotherapy.  Dose was decreased after which he seemed to do better with his mental status but his cancer worsened.  Dose has been gradually increased since that time and patient has had worsening mental status during this period of time.  Patient ha a history of left occipital brain metastasis and a punctate area of concern that has been followed in the left posterior temporal area.   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  There are areas in the left periatrial white matter and occipital lobe that may correspond to previous findings but do not have these images for comparison.   The size of the acute infarct is very small and not likely in and of itself the etiology of the mental status changes although it may be contributing along with his increased chemotherapy dose and thyroid abnormalities.  Etiology unclear but may be of embolic etiology.   Hypercoagulability related to  cancer a possibility as well.    Recommendations: 1. HgbA1c, fasting lipid panel, thyroid panel 2. Echocardiogram pending 3. PT consult, OT consult, Speech consult 4. Carotid dopplers 5. Prophylactic therapy-Dual antiplatelet therapy with ASA 81mg  and Plavix  75mg  for three weeks with change to ASA 81mg  alone as monotherapy after that time. 6. NPO until RN stroke swallow screen 7. Telemetry monitoring 8. Frequent neuro checks 9. BP control with target BP<140/80 10. Follow up MRI of the brain with and without contrast in 2-3 months 11. Oncology to evaluate for patient for need of decrease in chemotherapy dosage  Alexis Goodell, MD Neurology [phone] 04/14/2020, 2:58 PM

## 2020-04-14 NOTE — Progress Notes (Addendum)
PROGRESS NOTE    Hunter Davila  GGY:[id number] DOB: December 06, 1955 DOA: 04/28/2020 PCP: Patient, No Pcp Per   Chief complaint.  Altered mental status. Brief Narrative:   Hunter Davila is a 64 y.o. male with medical history significant for renal cell carcinoma on oral chemotherapy, hypothyroidism, diabetes mellitus, hypertension and GERD who was brought into the ER by EMS for evaluation of mental status changes. Per patient friend, his the POA, patient has not been sleeping at night for a while.  He has severe right flank pain, he was taking pain medicine, but is not working.  The pain is worse at night, he frequently get up at night taking shower to try to reduce the pain.  Recently, he was seen by palliative care, he was started on long-acting pain medicine as well as as needed pain medicine.  Still cannot sleep well.  His mental status has been gradually getting worse over the last few months.  He is easily confused, but sometimes his mind is clear. Patient also had a severe back pain initially on the right side.  Recently the pain has spread to the midline and the left side.  Patient also had incontinent of urine, weakness in bilateral legs.  He has difficulty with walking. Patient also had a significant constipation, he has been nauseated and vomiting for the last 2 or 3 months.  Before admission, he had an episode of vomiting.  Chest x-ray showed left lower lobe pneumonia.  MRI of the brain showed a 2 suspicious metastasis in the brain, small infarct.  Neurology consult obtained.  Assessment & Plan:   Principal Problem:   Acute metabolic encephalopathy Active Problems:   Renal cancer ([facility])   Diabetes mellitus without complication (HCC)   Hypertension   GERD (gastroesophageal reflux disease)   Hypothyroidism   CAP (community acquired pneumonia)   Lactic acidosis   Anemia of chronic disease  #1.  Altered mental status. Appear to be multifactorial.  Patient has brain  metastasis previously identified, CT scan at this time showed 2 suspicious lesions.  Patient also has small stroke.  Patient also has sleep deprivation for a while due to pain. Will obtain neurology consult for stroke.  Continue pain medicine.  Add Seroquel every evening to improve sleep.  #2.  Severe back pain. Most likely secondary to metastatic renal cancer.  Patient has worsening night pain, he also had incontinence of urine as well as leg weakness.  Need to rule out lumbar spine metastasis.  Obtain MRI of the lumbar spine.  #3.  Left lower lobe aspiration pneumonia. Patient has been vomiting.  Left lower lobe pneumonia most likely secondary to aspiration.  Antibiotic changed to Unasyn.  #4.  Severe constipation. Start bowel protocol.  #5.  Failure to thrive. Obtain physical therapy/Occupational Therapy. Patient cannot go back to home, will obtain possible nursing home placement.  #6.  Renal cell carcinoma with brain mets. Followed by oncology.  7.  Lactic acidosis. Secondary to Metformin and cancer.  8.  Type 2 diabetes. Continue sliding scale insulin.  9.  Iron deficient anemia. Check  B12 level. Patient does not have any active bleeding.  I will give IV iron.  10.  Severe protein calorie malnutrition. Patient albumin 2.3.  He has a poor p.o. intake.  Start a supplement.  11.  Acute white matter stroke. Consult neurology.  DVT prophylaxis: Lovenox Code Status: DNR Family Communication: POA at bedside  .   Status is: Inpatient  Remains inpatient appropriate  because:Inpatient level of care appropriate due to severity of illness   Dispo: The patient is from: Home              Anticipated d/c is to: SNF              Anticipated d/c date is: 2 days              Patient currently is not medically stable to d/c.        I/O last 3 completed shifts: In: 380 [Blood:380] Out: 350 [Urine:350] No intake/output data recorded.     Consultants:    Oncology  Procedures: None  Antimicrobials: Unasyn  Subjective: Patient still very confused today.  She does not complain any headache or dizziness. No fever or chills. He still nauseous, no vomiting.  No abdominal pain.  Positive constipation. He denies any short of breath or cough. He does not have any dysuria, he had urinary incontinence.   Objective: Vitals:   04/14/20 0802 04/14/20 0922 04/14/20 1037 04/14/20 1100  BP: (!) 158/114 (!) 176/106  (!) 158/102  Pulse: (!) 108   (!) 107  Resp: 17   17  Temp:   98.5 F (36.9 C)   TempSrc:   Oral   SpO2: 100%   97%  Weight:      Height:        Intake/Output Summary (Last 24 hours) at 04/14/2020 1241 Last data filed at 04/14/2020 0411 Gross per 24 hour  Intake 380 ml  Output 350 ml  Net 30 ml   Filed Weights   05/03/2020 0345  Weight: 91.2 kg    Examination:  General exam: Appears calm and comfortable  Respiratory system: Clear to auscultation. Respiratory effort normal. Cardiovascular system: S1 & S2 heard, RRR. No JVD, murmurs, rubs, gallops or clicks. No pedal edema. Gastrointestinal system: Abdomen is nondistended, soft and nontender. No organomegaly or masses felt. Normal bowel sounds heard. Central nervous system: Alert and oriented x2. No focal neurological deficits. Extremities: Symmetric  Skin: No rashes, lesions or ulcers     Data Reviewed: I have personally reviewed following labs and imaging studies  CBC: Recent Labs  Lab 04/07/2020 0358 04/30/2020 1956 04/14/20 0150  WBC 6.7 5.0 4.6  NEUTROABS 5.3 3.8  --   HGB 7.9* 6.9* 7.5*  HCT 27.1* 23.3* 25.4*  MCV 76.1* 74.9* 74.9*  PLT 352 323 353   Basic Metabolic Panel: Recent Labs  Lab 04/23/2020 0358 04/14/20 0150  NA 135 141  K 4.5 4.0  CL 101 106  CO2 20* 24  GLUCOSE 167* 108*  BUN 12 10  CREATININE 0.99 0.99  CALCIUM 8.4* 8.4*   GFR: Estimated Creatinine Clearance: 89.9 mL/min (by C-G formula based on SCr of 0.99 mg/dL). Liver  Function Tests: Recent Labs  Lab 05/02/2020 0358 04/14/20 0150  AST 26 24  ALT 10 9  ALKPHOS 80 69  BILITOT 0.3 0.3  PROT 7.9 7.2  ALBUMIN 2.7* 2.3*   Recent Labs  Lab 04/21/2020 0358  LIPASE 20   No results for input(s): AMMONIA in the last 168 hours. Coagulation Profile: Recent Labs  Lab 04/28/2020 0358  INR 1.0   Cardiac Enzymes: No results for input(s): CKTOTAL, CKMB, CKMBINDEX, TROPONINI in the last 168 hours. BNP (last 3 results) No results for input(s): PROBNP in the last 8760 hours. HbA1C: No results for input(s): HGBA1C in the last 72 hours. CBG: Recent Labs  Lab 05/03/2020 1811 04/10/2020 1934 04/24/2020 2139 04/14/20 0758  GLUCAP  94 140* 123* 122*   Lipid Profile: No results for input(s): CHOL, HDL, LDLCALC, TRIG, CHOLHDL, LDLDIRECT in the last 72 hours. Thyroid Function Tests: No results for input(s): TSH, T4TOTAL, FREET4, T3FREE, THYROIDAB in the last 72 hours. Anemia Panel: Recent Labs    04/14/20 0416  FERRITIN 188  TIBC 217*  IRON 33*   Sepsis Labs: Recent Labs  Lab 04/29/2020 0358 04/06/2020 0641 04/18/2020 1956  LATICACIDVEN 2.4* 2.5* 1.4    Recent Results (from the past 240 hour(s))  Blood culture (routine single)     Status: None (Preliminary result)   Collection Time: 04/21/2020  3:58 AM   Specimen: BLOOD  Result Value Ref Range Status   Specimen Description BLOOD BLOOD RIGHT FOREARM  Final   Special Requests   Final    BOTTLES DRAWN AEROBIC AND ANAEROBIC Blood Culture results may not be optimal due to an excessive volume of blood received in culture bottles   Culture   Final    NO GROWTH 1 DAY Performed at [facility], [address] [place], [place] [address]    Report Status PENDING  Incomplete  Respiratory Panel by RT PCR (Flu A&B, Covid) - Nasopharyngeal Swab     Status: None   Collection Time: 04/11/2020  3:58 AM   Specimen: Nasopharyngeal Swab  Result Value Ref Range Status   SARS Coronavirus 2 by RT PCR NEGATIVE NEGATIVE  Final    Comment: (NOTE) SARS-CoV-2 target nucleic acids are NOT DETECTED.  The SARS-CoV-2 RNA is generally detectable in upper respiratoy specimens during the acute phase of infection. The lowest concentration of SARS-CoV-2 viral copies this assay can detect is 131 copies/mL. A negative result does not preclude SARS-Cov-2 infection and should not be used as the sole basis for treatment or other patient management decisions. A negative result may occur with  improper specimen collection/handling, submission of specimen other than nasopharyngeal swab, presence of viral mutation(s) within the areas targeted by this assay, and inadequate number of viral copies (<131 copies/mL). A negative result must be combined with clinical observations, patient history, and epidemiological information. The expected result is Negative.  Fact Sheet for Patients:  PinkCheek.be  Fact Sheet for Healthcare Providers:  GravelBags.it  This test is no t yet approved or cleared by the [place] FDA and  has been authorized for detection and/or diagnosis of SARS-CoV-2 by FDA under an Emergency Use Authorization (EUA). This EUA will remain  in effect (meaning this test can be used) for the duration of the COVID-19 declaration under Section 564(b)(1) of the Act, 21 U.S.C. section 360bbb-3(b)(1), unless the authorization is terminated or revoked sooner.     Influenza A by PCR NEGATIVE NEGATIVE Final   Influenza B by PCR NEGATIVE NEGATIVE Final    Comment: (NOTE) The Xpert Xpress SARS-CoV-2/FLU/RSV assay is intended as an aid in  the diagnosis of influenza from Nasopharyngeal swab specimens and  should not be used as a sole basis for treatment. Nasal washings and  aspirates are unacceptable for Xpert Xpress SARS-CoV-2/FLU/RSV  testing.  Fact Sheet for Patients: PinkCheek.be  Fact Sheet for Healthcare  Providers: GravelBags.it  This test is not yet approved or cleared by the [place] FDA and  has been authorized for detection and/or diagnosis of SARS-CoV-2 by  FDA under an Emergency Use Authorization (EUA). This EUA will remain  in effect (meaning this test can be used) for the duration of the  Covid-19 declaration under Section 564(b)(1) of the Act, 21  U.S.C.  section 360bbb-3(b)(1), unless the authorization is  terminated or revoked. Performed at [facility], [address] [place], [place] [address]   Culture, blood (single)     Status: None (Preliminary result)   Collection Time: 04/27/2020  1:36 PM   Specimen: BLOOD  Result Value Ref Range Status   Specimen Description BLOOD LEFT AC  Final   Special Requests   Final    BOTTLES DRAWN AEROBIC ONLY Blood Culture adequate volume   Culture   Final    NO GROWTH < 24 HOURS Performed at [facility], [address], [place], [place] [address]    Report Status PENDING  Incomplete         Radiology Studies: CT Head Wo Contrast  Result Date: 04/17/2020 CLINICAL DATA:  Mental status change. EXAM: CT HEAD WITHOUT CONTRAST TECHNIQUE: Contiguous axial images were obtained from the base of the skull through the vertex without intravenous contrast. COMPARISON:  08/27/2019 FINDINGS: Brain: Age advanced cerebral atrophy. Mild ventriculomegaly is similar, favored to be secondary to atrophy. Mild low density in the periventricular white matter likely related to small vessel disease. there is also age advanced cerebellar atrophy. No mass lesion, hemorrhage, acute infarct, intra-axial, or extra-axial fluid collection. Vascular: Intracranial atherosclerosis. Skull: Normal skull. Sinuses/Orbits: Normal imaged portions of the orbits and globes. Clear paranasal sinuses and mastoid air cells. Other: None. IMPRESSION: 1. No acute intracranial abnormality. 2. Cerebral and cerebellar atrophy. Similar  mild ventriculomegaly, felt to be secondary to cerebral atrophy. 3. Mild small vessel ischemic change. Electronically Signed   By: Abigail Miyamoto M.D.   On: 04/17/2020 05:57   CT CHEST WO CONTRAST  Result Date: 04/08/2020 CLINICAL DATA:  Pleural effusion. History of metastatic renal cell carcinoma. EXAM: CT CHEST WITHOUT CONTRAST TECHNIQUE: Multidetector CT imaging of the chest was performed following the standard protocol without IV contrast. COMPARISON:  March 18, 2020, April 13, 2020 FINDINGS: Cardiovascular: Three-vessel coronary artery atherosclerotic calcifications. Heart is at the upper limits of normal in size. Scattered atherosclerotic calcifications throughout the aorta. No pericardial effusion. Mediastinum/Nodes: No axillary adenopathy. RIGHT supraclavicular lymph node measures 10 mm in the short axis, previously 8 mm (series 2, image 11). LEFT supraclavicular lymph node measures 10 mm in the short axis, previously 8 mm (series 2, image 14). RIGHT paratracheal lymph node measures 10 mm, previously 8 mm (series 2, image 47). Additional RIGHT paratracheal lymph node measures 11 mm in the short axis, previously 10 mm (series 2, image 53). Lymph node posterior to the esophagus measures 8 mm, previously 5 mm (series 2, image 74). Lungs/Pleura: Moderate LEFT and trace RIGHT pleural effusion. There is irregular consolidative opacity of the LEFT lower lobe, new since prior. There is scattered irregular consolidative opacity of the RIGHT lower lobe which may reflect a combination of pulmonary nodules and consolidation. There are innumerable pulmonary nodules. Many of these nodules are relatively ill-defined with central lucency. The size and conspicuity of pulmonary nodules has increased in comparison to prior. Representative nodules are as follows: Nodule 1: LEFT upper lobe ill-defined nodule with central lucency measures 12 mm, previously 10 mm (series 2, image 42). Nodule 2: LEFT upper lobe ill-defined  nodule with central lucency measures 9 mm, previously 6 mm (series 2, image 66). Nodule 3: RIGHT lower lobe ill-defined nodule with central lucency measures 7 mm, previously 4 mm (series 2, image 63). Upper Abdomen: Increased RIGHT adrenal nodularity measuring 3.3 x 1.9 cm, previously 2.8 x 1.4 cm (series 2, image 148). Infiltrative  RIGHT renal mass is poorly visualized on current exam. Musculoskeletal: 15 mm RIGHT axillary subcutaneous mass, previously 14 mm. Mild degenerative changes of the thoracic spine. There is a lytic lesion of the RIGHT aspect of T12, increased in comparison to prior IMPRESSION: 1. Moderate LEFT and trace RIGHT pleural effusions. 2. Irregular consolidative opacity of the LEFT greater than RIGHT lower lobes may reflect a combination of pulmonary nodules and infectious/inflammatory consolidation. 3. Innumerable pulmonary nodules, increased in size and conspicuity in comparison to prior study. This is consistent with worsening metastatic disease. 4. Increased RIGHT adrenal nodularity consistent with worsening metastatic disease. 5. Increased size of lytic lesion of the RIGHT aspect of T12 consistent with worsening metastatic disease. 6. Increased mediastinal and supraclavicular adenopathy, consistent with worsening metastatic disease. 7. RIGHT axillary subcutaneous mass has mildly increased in size. This may reflect a epidermal inclusion cyst versus metastatic disease. Correlate with physical exam. Aortic Atherosclerosis (ICD10-I70.0). Electronically Signed   By: Valentino Saxon MD   On: 04/27/2020 10:17   MR BRAIN W WO CONTRAST  Result Date: 04/17/2020 CLINICAL DATA:  Initial evaluation for acute delirium. History of renal cell carcinoma. EXAM: MRI HEAD WITHOUT AND WITH CONTRAST TECHNIQUE: Multiplanar, multiecho pulse sequences of the brain and surrounding structures were obtained without and with intravenous contrast. CONTRAST:  7.39mL GADAVIST GADOBUTROL 1 MMOL/ML IV SOLN COMPARISON:   Prior head CT from earlier the same day as well as previous MRI from 08/27/2019. FINDINGS: Brain: Examination degraded by motion artifact. Diffuse prominence of the CSF containing spaces compatible generalized cerebral atrophy. Mild patchy T2/FLAIR hyperintensity within the periventricular white matter, nonspecific, but most like related chronic microvascular ischemic disease, mild for age. 8 mm focus of diffusion abnormality seen involving the deep white matter of the posterior left frontal centrum semi ovale, consistent with an acute to subacute ischemic infarct (series 5, image 37). No associated hemorrhage or mass effect. No other evidence for acute or subacute ischemia. Again seen is a focus of susceptibility artifact at the left occipital pole, suggesting an underlying microhemorrhage (series 13, image 24). This was also seen on prior brain MRI from 08/27/2019. However, on today's exam, now seen is a small amount of T2/FLAIR signal abnormality suggestive of vasogenic edema (series 15, image 21), not seen on prior exam. No definite associated enhancement evident on this motion degraded exam. A possible small cavernoma that has bled in the interim with associated edema could also be considered. Additional foci of susceptibility artifact involving the left periatrial white matter (series 13, image 30) also seen, stable from previous. There is a small focus of associated nodular enhancement about this lesion (series 18, image 13), stable. No significant surrounding vasogenic edema. Again, this could reflect a small cavernoma, although a small metastatic lesion is not entirely excluded. Additional focus of susceptibility artifact at the right cerebellum also unchanged (series 13, image 17). This lesion demonstrates no appreciable enhancement or associated edema. No other mass lesion, mass effect, or midline shift. Mild ventricular prominence related to global parenchymal volume loss without hydrocephalus. No  extra-axial fluid collection. Pituitary gland suprasellar region within normal limits. Midline structures intact. No other abnormal enhancement. Vascular: Major intracranial vascular flow voids are maintained. Skull and upper cervical spine: Craniocervical junction within normal limits. Bone marrow signal intensity normal. No focal marrow replacing lesion. No scalp soft tissue abnormality. Sinuses/Orbits: Globes and orbital soft tissues within normal limits. Mild mucosal thickening noted within the maxillary sinuses and ethmoidal air cells. Paranasal sinuses are otherwise clear. No  mastoid effusion. Inner ear structures grossly normal. Other: None. IMPRESSION: 1. 8 mm acute to subacute ischemic infarct involving the deep white matter of the posterior left frontal centrum semi ovale. No associated hemorrhage or mass effect. 2. Small foci of susceptibility artifact involving the left occipital pole with new small volume surrounding T2/FLAIR signal abnormality, suggesting vasogenic edema. Finding is indeterminate, and could reflect a small cavernoma that has bled in the interim. A small metastatic lesion could also be considered. A short interval follow-up MRI in 2-3 months or earlier if warranted is recommended for further evaluation. 3. Additional focus of susceptibility artifact at the left periatrial white matter with associated punctate nodular enhancement, stable. Again, finding may reflect a small cavernoma or possibly metastasis. Attention at follow-up recommended. Additional focus of susceptibility artifact at the right cerebellum without associated enhancement or edema is unchanged. 4. Generalized age-related cerebral atrophy with mild chronic microvascular ischemic disease. Electronically Signed   By: Jeannine Boga M.D.   On: 04/15/2020 22:05   DG Chest Port 1 View  Result Date: 04/21/2020 CLINICAL DATA:  Emesis.  Mental status changes. EXAM: PORTABLE CHEST 1 VIEW COMPARISON:  None. FINDINGS:  Midline trachea. Normal heart size. Small left pleural effusion. No pneumothorax. Low lung volumes with resultant pulmonary interstitial prominence. Subtle left base airspace disease. IMPRESSION: Small left pleural effusion with adjacent airspace disease, atelectasis or infection. Electronically Signed   By: Abigail Miyamoto M.D.   On: 04/11/2020 05:26        Scheduled Meds: . amLODipine  10 mg Oral Daily  . baclofen  10 mg Oral TID  . insulin aspart  0-15 Units Subcutaneous TID WC  . levothyroxine  150 mcg Oral Daily  . OLANZapine  5 mg Oral QHS  . pantoprazole (PROTONIX) IV  40 mg Intravenous Q12H  . senna  1 tablet Oral Daily  . tamsulosin  0.4 mg Oral Daily   Continuous Infusions: . ampicillin-sulbactam (UNASYN) IV    . azithromycin Stopped (04/14/20 1018)     LOS: 1 day    Time spent: 74 minutes    Sharen Hones, MD [facility]   To contact the attending provider between 7A-7P or the covering provider during after hours 7P-7A, please log into the web site [email or web address] and access using universal [place] password for that web site. If you do not have the password, please call the hospital operator.  04/14/2020, 12:41 PM

## 2020-04-14 NOTE — ED Notes (Signed)
RN has attempted to call POA two times today.

## 2020-04-15 ENCOUNTER — Inpatient Hospital Stay: Payer: Medicare Other

## 2020-04-15 ENCOUNTER — Inpatient Hospital Stay (HOSPITAL_COMMUNITY)
Admit: 2020-04-15 | Discharge: 2020-04-15 | Disposition: A | Discharge status: Home / Self Care | Payer: Medicare Other | Attending: Neurology | Admitting: Neurology

## 2020-04-15 DIAGNOSIS — R627 Adult failure to thrive: Secondary | ICD-10-CM | POA: Diagnosis not present

## 2020-04-15 DIAGNOSIS — I6389 Other cerebral infarction: Secondary | ICD-10-CM

## 2020-04-15 DIAGNOSIS — I639 Cerebral infarction, unspecified: Secondary | ICD-10-CM | POA: Diagnosis not present

## 2020-04-15 DIAGNOSIS — J690 Pneumonitis due to inhalation of food and vomit: Secondary | ICD-10-CM | POA: Diagnosis not present

## 2020-04-15 DIAGNOSIS — D509 Iron deficiency anemia, unspecified: Secondary | ICD-10-CM | POA: Diagnosis not present

## 2020-04-15 DIAGNOSIS — M549 Dorsalgia, unspecified: Secondary | ICD-10-CM | POA: Diagnosis not present

## 2020-04-15 LAB — CBC WITH DIFFERENTIAL/PLATELET
Abs Immature Granulocytes: 0.01 10*3/uL (ref 0.00–0.07)
Basophils Absolute: 0 10*3/uL (ref 0.0–0.1)
Basophils Relative: 1 %
Eosinophils Absolute: 0.3 10*3/uL (ref 0.0–0.5)
Eosinophils Relative: 6 %
HCT: 27.9 % — ABNORMAL LOW (ref 39.0–52.0)
Hemoglobin: 8.3 g/dL — ABNORMAL LOW (ref 13.0–17.0)
Immature Granulocytes: 0 %
Lymphocytes Relative: 13 %
Lymphs Abs: 0.6 10*3/uL — ABNORMAL LOW (ref 0.7–4.0)
MCH: 22.8 pg — ABNORMAL LOW (ref 26.0–34.0)
MCHC: 29.7 g/dL — ABNORMAL LOW (ref 30.0–36.0)
MCV: 76.6 fL — ABNORMAL LOW (ref 80.0–100.0)
Monocytes Absolute: 0.6 10*3/uL (ref 0.1–1.0)
Monocytes Relative: 14 %
Neutro Abs: 3.1 10*3/uL (ref 1.7–7.7)
Neutrophils Relative %: 66 %
Platelets: 355 10*3/uL (ref 150–400)
RBC: 3.64 MIL/uL — ABNORMAL LOW (ref 4.22–5.81)
RDW: 18.8 % — ABNORMAL HIGH (ref 11.5–15.5)
WBC: 4.6 10*3/uL (ref 4.0–10.5)
nRBC: 0 % (ref 0.0–0.2)

## 2020-04-15 LAB — CBC
HCT: 27 % — ABNORMAL LOW (ref 39.0–52.0)
Hemoglobin: 8 g/dL — ABNORMAL LOW (ref 13.0–17.0)
MCH: 22.7 pg — ABNORMAL LOW (ref 26.0–34.0)
MCHC: 29.6 g/dL — ABNORMAL LOW (ref 30.0–36.0)
MCV: 76.7 fL — ABNORMAL LOW (ref 80.0–100.0)
Platelets: 352 10*3/uL (ref 150–400)
RBC: 3.52 MIL/uL — ABNORMAL LOW (ref 4.22–5.81)
RDW: 18.7 % — ABNORMAL HIGH (ref 11.5–15.5)
WBC: 4.7 10*3/uL (ref 4.0–10.5)
nRBC: 0 % (ref 0.0–0.2)

## 2020-04-15 LAB — GLUCOSE, CAPILLARY
Glucose-Capillary: 120 mg/dL — ABNORMAL HIGH (ref 70–99)
Glucose-Capillary: 187 mg/dL — ABNORMAL HIGH (ref 70–99)
Glucose-Capillary: 96 mg/dL (ref 70–99)
Glucose-Capillary: 156 mg/dL — ABNORMAL HIGH (ref 70–99)

## 2020-04-15 LAB — TYPE AND SCREEN
ABO/RH(D): A POS
Antibody Screen: NEGATIVE
Unit division: 0

## 2020-04-15 LAB — ECHOCARDIOGRAM COMPLETE BUBBLE STUDY
AR max vel: 2.86 cm2
AV Area VTI: 3.29 cm2
AV Area mean vel: 2.93 cm2
AV Mean grad: 3 mmHg
AV Peak grad: 5.4 mmHg
Ao pk vel: 1.16 m/s
Area-P 1/2: 3.6 cm2
S' Lateral: 3.29 cm

## 2020-04-15 LAB — BPAM RBC
Blood Product Expiration Date: 202111062359
ISSUE DATE / TIME: 202110110145
Unit Type and Rh: 6200

## 2020-04-15 MED ORDER — AMOXICILLIN-POT CLAVULANATE 875-125 MG PO TABS
1.0000 | ORAL_TABLET | Freq: Two times a day (BID) | ORAL | Status: DC
  Administered 2020-04-15 – 2020-04-17 (×5): 1 via ORAL
  Filled 2020-04-15 (×5): qty 1

## 2020-04-15 MED ORDER — AZITHROMYCIN 500 MG PO TABS
500.0000 mg | ORAL_TABLET | Freq: Every day | ORAL | Status: DC
  Administered 2020-04-15: 500 mg via ORAL
  Filled 2020-04-15: qty 1

## 2020-04-15 MED ORDER — HYDROMORPHONE HCL 1 MG/ML IJ SOLN: 1.0000 mg | Freq: Once | INTRAMUSCULAR | Status: DC

## 2020-04-15 MED ORDER — ENSURE ENLIVE PO LIQD
237.0000 mL | Freq: Three times a day (TID) | ORAL | Status: DC
  Administered 2020-04-15 – 2020-04-17 (×4): 237 mL via ORAL

## 2020-04-15 NOTE — Consult Note (Signed)
[name], MD [address], [place], [place], [place], [address] [address], [place], [place], [place], [address] Phone: [phone]  Fax: [phone]  Consultation  Referring Provider:     Dr. Roosevelt Locks Primary Care Physician:  Patient, No Pcp Per Reason for Consultation:     GI bleed  Date of Admission:  05/04/2020 Date of Consultation:  04/15/2020         HPI:   Hunter Davila is a 64 y.o. male with history of renal cell carcinoma on oral chemotherapy, presented with altered mental status.  Patient somnolent and lethargic and unable to provide history.  History obtained from previous documentation in patient's chart.    On presentation MRI brain noted to suspicious metastatic lesions in the brain, small infarct.  Neurology consult was obtained and as per Dr. Doy Mince note "Current MRI of the brain personally reviewed and reveals an acute to subacute left posterior frontal acute infarct.  There are areas in the left periatrial white matter and occipital lobe that may correspond to previous findings but do not have these images for comparison.   The size of the acute infarct is very small and not likely in and of itself the etiology of the mental status changes although it may be contributing along with his increased chemotherapy dose and thyroid abnormalities.  Etiology unclear but may be of embolic etiology.   Hypercoagulability related to cancer a possibility as well. "  Patient has not had any signs of active GI bleeding.  He has chronic microcytic anemia, with acute worsening during this admission.  Ferritin is normal but this can be elevated in the acute setting.  Low iron and iron saturation  No endoscopic work-up available in his chart  Past Medical History:  Diagnosis Date  . Cancer ([facility])   . Diabetes mellitus without complication ([facility])   . Hypertension   . Renal disorder    Renal cancer    History reviewed. No pertinent surgical history.  Prior to  Admission medications   Medication Sig Start Date End Date Taking? Authorizing Provider  acetaminophen (TYLENOL) 500 MG tablet Take 1 tablet by mouth every 8 (eight) hours as needed.   Yes [provider]  amLODipine (NORVASC) 10 MG tablet Take 10 mg by mouth daily. 02/27/20  Yes [provider]  baclofen (LIORESAL) 10 MG tablet Take 10 mg by mouth 3 (three) times daily.   Yes [provider]  clindamycin (CLEOCIN T) 1 % external solution Apply 1 application topically 2 (two) times daily.   Yes [provider]  clobetasol (TEMOVATE) 0.05 % external solution Apply 1-2 times per day to the affected scalp areas as directed   Yes [provider]  everolimus (AFINITOR) 5 MG tablet Take 5 mg by mouth at bedtime. 03/16/20  Yes [provider]  famotidine (PEPCID) 20 MG tablet Take 20 mg by mouth 2 (two) times daily as needed.    Yes [provider]  lactulose (CHRONULAC) 10 GM/15ML solution Take 20 g by mouth daily as needed for mild constipation.   Yes [provider]  lenvatinib 14 mg daily dose (LENVIMA) 10 & 4 MG capsule Take 12 mg by mouth daily. Take 1/2 of a 4 mg and 1 x 10 mg capsules (12 mg dose) by mouth once daily 03/20/20  Yes Annita Brod, MD  levothyroxine (SYNTHROID) 150 MCG tablet Take 1 tablet (150 mcg total) by mouth daily. 03/21/20  Yes Annita Brod, MD  metFORMIN (GLUCOPHAGE) 1000 MG tablet Take 1 tablet by mouth daily. 04/09/20  Yes [provider]  OLANZapine (ZYPREXA) 5 MG tablet Take 5 mg by mouth at bedtime.   Yes [provider]  oxyCODONE (OXY IR/ROXICODONE) 5 MG immediate release tablet Take 5-10 mg by mouth every 4 (four) hours as needed for pain.  03/05/20  Yes [provider]  oxyCODONE (OXYCONTIN) 15 mg 12 hr tablet Take 15 mg by mouth every 12 (twelve) hours. For 14 days 04/08/20 2020-05-17 Yes [provider]  potassium chloride SA (KLOR-CON) 20 MEQ tablet Take 20  mEq by mouth 2 (two) times daily. 03/12/20  Yes [provider]  prochlorperazine (COMPAZINE) 10 MG tablet Take 1 tablet by mouth every 6 (six) hours as needed. 03/26/20  Yes [provider]  senna (SENOKOT) 8.6 MG tablet Take 1 tablet by mouth daily. 11/28/19 11/27/20 Yes [provider]  tamsulosin (FLOMAX) 0.4 MG CAPS capsule Take 0.4 mg by mouth daily. Take 30 minutes after same meal each day 03/17/20  Yes [provider]  triamcinolone cream (KENALOG) 0.1 % Apply topically 2 (two) times daily Apply to affected area with rash ONLY   Yes [provider]  diclofenac Sodium (VOLTAREN) 1 % GEL Apply 2 g topically 4 (four) times daily. Patient not taking: Reported on 04/12/2020    [provider]  glipiZIDE (GLUCOTROL) 5 MG tablet Take 1 tablet (5 mg total) by mouth daily. Patient not taking: Reported on 04/10/2020 03/20/20 03/20/21  Annita Brod, MD  ketoconazole (NIZORAL) 2 % shampoo Apply 1 application topically 3 (three) times a week. Apply 1-3 times a week on scalp. Let sit 10 minutes then rinse out 12/11/19   [provider]    Family History  Problem Relation Age of Onset  . Diabetes Mellitus II Mother   . Prostate cancer Father      Social History   Tobacco Use  . Smoking status: Never Smoker  . Smokeless tobacco: Never Used  Substance Use Topics  . Alcohol use: Not Currently  . Drug use: Not on file    Allergies as of 04/18/2020  . (No Known Allergies)    Review of Systems:    All systems reviewed and negative except where noted in HPI.   Physical Exam:  Vital signs in last 24 hours: Vitals:   04/15/20 0337 04/15/20 0731 04/15/20 1305 04/15/20 1459  BP: (!) 141/94 117/82 114/73 123/81  Pulse: (!) 104 (!) 103 (!) 117 97  Resp: 18 18 18 16   Temp: 98.7 F (37.1 C) 98.6 F (37 C) 98.4 F (36.9 C) 98.6 F (37 C)  TempSrc: Oral  Oral Oral  SpO2: 97% 96% 98% 97%  Weight:      Height:       Last BM Date:  05/02/2020 General:   Pleasant, cooperative in NAD Head:  Normocephalic and atraumatic. Eyes:   No icterus.   Conjunctiva pink. PERRLA. Ears:  Normal auditory acuity. Neck:  Supple; no masses or thyroidomegaly Lungs: Respirations even and unlabored. Lungs clear to auscultation bilaterally.   No wheezes, crackles, or rhonchi.  Abdomen:  Soft, nondistended, nontender. Normal bowel sounds. No appreciable masses or hepatomegaly.  No rebound or guarding.  Neurologic:  Alert and oriented x3;  grossly normal neurologically. Skin:  Intact without significant lesions or rashes. Cervical Nodes:  No significant cervical adenopathy. Psych:  Alert and cooperative. Normal affect.  LAB RESULTS: Recent Labs    04/14/20 0150 04/15/20 0014  04/15/20 0318  WBC 4.6 4.7 4.6  HGB 7.5* 8.0* 8.3*  HCT 25.4* 27.0* 27.9*  PLT 349 352 355   BMET Recent Labs    04/08/2020 0358 04/14/20 0150  NA 135 141  K 4.5 4.0  CL 101 106  CO2 20* 24  GLUCOSE 167* 108*  BUN 12 10  CREATININE 0.99 0.99  CALCIUM 8.4* 8.4*   LFT Recent Labs    04/14/20 0150  PROT 7.2  ALBUMIN 2.3*  AST 24  ALT 9  ALKPHOS 69  BILITOT 0.3   PT/INR Recent Labs    04/12/2020 0358  LABPROT 13.1  INR 1.0    STUDIES: MR BRAIN W WO CONTRAST  Result Date: 04/16/2020 CLINICAL DATA:  Initial evaluation for acute delirium. History of renal cell carcinoma. EXAM: MRI HEAD WITHOUT AND WITH CONTRAST TECHNIQUE: Multiplanar, multiecho pulse sequences of the brain and surrounding structures were obtained without and with intravenous contrast. CONTRAST:  7.61mL GADAVIST GADOBUTROL 1 MMOL/ML IV SOLN COMPARISON:  Prior head CT from earlier the same day as well as previous MRI from 08/27/2019. FINDINGS: Brain: Examination degraded by motion artifact. Diffuse prominence of the CSF containing spaces compatible generalized cerebral atrophy. Mild patchy T2/FLAIR hyperintensity within the periventricular white matter, nonspecific, but most like related  chronic microvascular ischemic disease, mild for age. 8 mm focus of diffusion abnormality seen involving the deep white matter of the posterior left frontal centrum semi ovale, consistent with an acute to subacute ischemic infarct (series 5, image 37). No associated hemorrhage or mass effect. No other evidence for acute or subacute ischemia. Again seen is a focus of susceptibility artifact at the left occipital pole, suggesting an underlying microhemorrhage (series 13, image 24). This was also seen on prior brain MRI from 08/27/2019. However, on today's exam, now seen is a small amount of T2/FLAIR signal abnormality suggestive of vasogenic edema (series 15, image 21), not seen on prior exam. No definite associated enhancement evident on this motion degraded exam. A possible small cavernoma that has bled in the interim with associated edema could also be considered. Additional foci of susceptibility artifact involving the left periatrial white matter (series 13, image 30) also seen, stable from previous. There is a small focus of associated nodular enhancement about this lesion (series 18, image 13), stable. No significant surrounding vasogenic edema. Again, this could reflect a small cavernoma, although a small metastatic lesion is not entirely excluded. Additional focus of susceptibility artifact at the right cerebellum also unchanged (series 13, image 17). This lesion demonstrates no appreciable enhancement or associated edema. No other mass lesion, mass effect, or midline shift. Mild ventricular prominence related to global parenchymal volume loss without hydrocephalus. No extra-axial fluid collection. Pituitary gland suprasellar region within normal limits. Midline structures intact. No other abnormal enhancement. Vascular: Major intracranial vascular flow voids are maintained. Skull and upper cervical spine: Craniocervical junction within normal limits. Bone marrow signal intensity normal. No focal marrow  replacing lesion. No scalp soft tissue abnormality. Sinuses/Orbits: Globes and orbital soft tissues within normal limits. Mild mucosal thickening noted within the maxillary sinuses and ethmoidal air cells. Paranasal sinuses are otherwise clear. No mastoid effusion. Inner ear structures grossly normal. Other: None. IMPRESSION: 1. 8 mm acute to subacute ischemic infarct involving the deep white matter of the posterior left frontal centrum semi ovale. No associated hemorrhage or mass effect. 2. Small foci of susceptibility artifact involving the left occipital pole with new small volume surrounding T2/FLAIR signal abnormality, suggesting vasogenic edema. Finding is  indeterminate, and could reflect a small cavernoma that has bled in the interim. A small metastatic lesion could also be considered. A short interval follow-up MRI in 2-3 months or earlier if warranted is recommended for further evaluation. 3. Additional focus of susceptibility artifact at the left periatrial white matter with associated punctate nodular enhancement, stable. Again, finding may reflect a small cavernoma or possibly metastasis. Attention at follow-up recommended. Additional focus of susceptibility artifact at the right cerebellum without associated enhancement or edema is unchanged. 4. Generalized age-related cerebral atrophy with mild chronic microvascular ischemic disease. Electronically Signed   By: Jeannine Boga M.D.   On: 04/27/2020 22:05   ECHOCARDIOGRAM COMPLETE BUBBLE STUDY  Result Date: 04/15/2020    ECHOCARDIOGRAM REPORT   Patient Name:   Hunter Davila Date of Exam: 04/15/2020 Medical Rec #:  [id number]            Height:       74.0 in Accession #:    [id number]           Weight:       201.0 lb Date of Birth:  1955/07/07            BSA:          2.178 m Patient Age:    27 years             BP:           141/94 mmHg Patient Gender: M                    HR:           104 bpm. Exam Location:  [facility] Procedure: 2D Echo,  Cardiac Doppler, Color Doppler and Saline Contrast Bubble            Study Indications:     Stroke 434.91  History:         Patient has no prior history of Echocardiogram examinations.                  Risk Factors:Hypertension and Diabetes.  Sonographer:     Sherrie Sport RDCS (AE) Referring Phys:  [place] Diagnosing Phys: Nelva Bush MD IMPRESSIONS  1. Left ventricular ejection fraction, by estimation, is 55 to 60%. The left ventricle has normal function. Left ventricular endocardial border not optimally defined to evaluate regional wall motion. There is mild left ventricular hypertrophy. Left ventricular diastolic parameters are consistent with Grade I diastolic dysfunction (impaired relaxation).  2. Right ventricular systolic function is normal. The right ventricular size is normal.  3. Left atrial size was mildly dilated.  4. The mitral valve is grossly normal. No evidence of mitral valve regurgitation.  5. The aortic valve is tricuspid. There is mild thickening of the aortic valve. Aortic valve regurgitation is not visualized. Mild aortic valve sclerosis is present, with no evidence of aortic valve stenosis.  6. Bubble study is nondiagnostic. A small right-to-left shunt cannot be excluded. FINDINGS  Left Ventricle: Left ventricular ejection fraction, by estimation, is 55 to 60%. The left ventricle has normal function. Left ventricular endocardial border not optimally defined to evaluate regional wall motion. The left ventricular internal cavity size was normal in size. There is mild left ventricular hypertrophy. Left ventricular diastolic parameters are consistent with Grade I diastolic dysfunction (impaired relaxation). Right Ventricle: The right ventricular size is normal. No increase in right ventricular wall thickness. Right ventricular systolic function is normal. Left Atrium: Left atrial size was  mildly dilated. Right Atrium: Right atrial size was normal in size. Pericardium: The  pericardium was not well visualized. Mitral Valve: The mitral valve is grossly normal. No evidence of mitral valve regurgitation. Tricuspid Valve: The tricuspid valve is not well visualized. Tricuspid valve regurgitation is not demonstrated. Aortic Valve: The aortic valve is tricuspid. There is mild thickening of the aortic valve. Aortic valve regurgitation is not visualized. Mild aortic valve sclerosis is present, with no evidence of aortic valve stenosis. Aortic valve mean gradient measures 3.0 mmHg. Aortic valve peak gradient measures 5.4 mmHg. Aortic valve area, by VTI measures 3.29 cm. Pulmonic Valve: The pulmonic valve was not well visualized. Pulmonic valve regurgitation is not visualized. No evidence of pulmonic stenosis. Aorta: The aortic root is normal in size and structure. Pulmonary Artery: The pulmonary artery is of normal size. IAS/Shunts: The interatrial septum was not well visualized. Agitated saline contrast was given intravenously to evaluate for intracardiac shunting. Bubble study is nondiagnostic. A small right-to-left shunt cannot be excluded.  LEFT VENTRICLE PLAX 2D LVIDd:         4.73 cm  Diastology LVIDs:         3.28 cm  LV e' medial:    7.29 cm/s LV PW:         1.04 cm  LV E/e' medial:  7.8 LV IVS:        1.32 cm  LV e' lateral:   7.51 cm/s LVOT diam:     2.30 cm  LV E/e' lateral: 7.6 LV SV:         58 LV SV Index:   27 LVOT Area:     4.15 cm  RIGHT VENTRICLE RV S prime:     13.10 cm/s TAPSE (M-mode): 1.6 cm LEFT ATRIUM             Index       RIGHT ATRIUM           Index LA diam:        3.90 cm 1.79 cm/m  RA Area:     10.20 cm LA Vol (A2C):   73.8 ml 33.88 ml/m RA Volume:   17.80 ml  8.17 ml/m LA Vol (A4C):   59.6 ml 27.36 ml/m LA Biplane Vol: 73.7 ml 33.84 ml/m  AORTIC VALVE                   PULMONIC VALVE AV Area (Vmax):    2.86 cm    PV Vmax:        0.84 m/s AV Area (Vmean):   2.93 cm    PV Peak grad:   2.8 mmHg AV Area (VTI):     3.29 cm    RVOT Peak grad: 4 mmHg AV Vmax:            116.00 cm/s AV Vmean:          75.400 cm/s AV VTI:            0.177 m AV Peak Grad:      5.4 mmHg AV Mean Grad:      3.0 mmHg LVOT Vmax:         79.80 cm/s LVOT Vmean:        53.200 cm/s LVOT VTI:          0.140 m LVOT/AV VTI ratio: 0.79  AORTA Ao Root diam: 3.40 cm MITRAL VALVE MV Area (PHT): 3.60 cm    SHUNTS MV Decel Time: 211 msec  Systemic VTI:  0.14 m MV E velocity: 57.20 cm/s  Systemic Diam: 2.30 cm MV A velocity: 91.20 cm/s MV E/A ratio:  0.63 Christopher End MD Electronically signed by Nelva Bush MD Signature Date/Time: 04/15/2020/11:04:48 AM    Final       Impression / Plan:   Hunter Davila is a 64 y.o. y/o male with history of renal cell carcinoma on oral chemotherapy, presented with altered mental status with MRI brain showing acute to subacute ischemic infarct, with GI consulted for acute on chronic anemia with no evidence of active GI bleeding  Endoscopic procedures with sedation are high risk in the setting of cerebral infarct.  Unclear if MRI changes are acute or subacute.  Neurology has recommended aspirin and Plavix.   If endoscopic procedures are needed prior to starting plavix, pt will need neurology clearance.  In the absence of active GI bleeding, endoscopic procedures are not urgent  Would recommend palliative care consult to determine goals of care as well  PPI IV twice daily  Continue serial CBCs and transfuse PRN Avoid NSAIDs Maintain 2 large-bore IV lines Please page GI with any acute hemodynamic changes, or signs of active GI bleeding  If active bleeding occurs consider RBC scan to localize source as that would be safer than sedating the pt for endoscopy in the setting of stroke.   Thank you for involving me in the care of this patient.      LOS: 2 days   Virgel Manifold, MD  04/15/2020, 8:28 PM

## 2020-04-15 NOTE — Progress Notes (Signed)
Subjective: Patient unchanged.   Objective: Current vital signs: BP 123/81 (BP Location: Left Arm)   Pulse 97   Temp 98.6 F (37 C) (Oral)   Resp 16   Ht 6\' 2"  (1.88 m)   Wt 91.2 kg   SpO2 97%   BMI 25.81 kg/m  Vital signs in last 24 hours: Temp:  [97.8 F (36.6 C)-98.7 F (37.1 C)] 98.6 F (37 C) (10/12 1459) Pulse Rate:  [97-129] 97 (10/12 1459) Resp:  [16-19] 16 (10/12 1459) BP: (114-146)/(73-94) 123/81 (10/12 1459) SpO2:  [94 %-98 %] 97 % (10/12 1459)  Intake/Output from previous day: 10/11 0701 - 10/12 0700 In: -  Out: 350 [Urine:350] Intake/Output this shift: Total I/O In: 240 [P.O.:240] Out: -  Nutritional status:  Diet Order            Diet Carb Modified Fluid consistency: Thin; Room service appropriate? Yes  Diet effective now                 Neurologic Exam: Mental Status: Alert, oriented to name and place.  Speech fluent without evidence of aphasia.  Able to follow 3 step commands without difficulty.  Restless Cranial Nerves: II: Visual fields grossly normal, pupils equal, round, reactive to light and accommodation III,IV, VI: ptosis not present, extra-ocular motions intact bilaterally V,VII: smile symmetric, facial light touch sensation normal bilaterally VIII: hearing normal bilaterally IX,X: gag reflex present XI: bilateral shoulder shrug XII: midline tongue extension Motor: Lifts all extremities against gravity Sensory: Pinprick and light touch intact throughout, bilaterally Deep Tendon Reflexes: 2+ in the upper extremities, 1+ KJ's and absent AJ's bilaterally  Lab Results: Basic Metabolic Panel: Recent Labs  Lab 04/10/2020 0358 04/14/20 0150  NA 135 141  K 4.5 4.0  CL 101 106  CO2 20* 24  GLUCOSE 167* 108*  BUN 12 10  CREATININE 0.99 0.99  CALCIUM 8.4* 8.4*    Liver Function Tests: Recent Labs  Lab 04/20/2020 0358 04/14/20 0150  AST 26 24  ALT 10 9  ALKPHOS 80 69  BILITOT 0.3 0.3  PROT 7.9 7.2  ALBUMIN 2.7* 2.3*    Recent Labs  Lab 04/26/2020 0358  LIPASE 20   No results for input(s): AMMONIA in the last 168 hours.  CBC: Recent Labs  Lab 05/04/2020 0358 04/23/2020 1956 04/14/20 0150 04/15/20 0014 04/15/20 0318  WBC 6.7 5.0 4.6 4.7 4.6  NEUTROABS 5.3 3.8  --   --  3.1  HGB 7.9* 6.9* 7.5* 8.0* 8.3*  HCT 27.1* 23.3* 25.4* 27.0* 27.9*  MCV 76.1* 74.9* 74.9* 76.7* 76.6*  PLT 352 323 349 352 355    Cardiac Enzymes: No results for input(s): CKTOTAL, CKMB, CKMBINDEX, TROPONINI in the last 168 hours.  Lipid Panel: No results for input(s): CHOL, TRIG, HDL, CHOLHDL, VLDL, LDLCALC in the last 168 hours.  CBG: Recent Labs  Lab 04/14/20 1222 04/14/20 1551 04/14/20 2103 04/15/20 0732 04/15/20 1131  GLUCAP 135* 141* 102* 120* 187*    Microbiology: Results for orders placed or performed during the hospital encounter of 04/04/2020  Blood culture (routine single)     Status: None (Preliminary result)   Collection Time: 04/19/2020  3:58 AM   Specimen: BLOOD  Result Value Ref Range Status   Specimen Description BLOOD BLOOD RIGHT FOREARM  Final   Special Requests   Final    BOTTLES DRAWN AEROBIC AND ANAEROBIC Blood Culture results may not be optimal due to an excessive volume of blood received in culture bottles  Culture   Final    NO GROWTH 2 DAYS Performed at [facility], [address] [place], [place] [address]    Report Status PENDING  Incomplete  Respiratory Panel by RT PCR (Flu A&B, Covid) - Nasopharyngeal Swab     Status: None   Collection Time: 04/08/2020  3:58 AM   Specimen: Nasopharyngeal Swab  Result Value Ref Range Status   SARS Coronavirus 2 by RT PCR NEGATIVE NEGATIVE Final    Comment: (NOTE) SARS-CoV-2 target nucleic acids are NOT DETECTED.  The SARS-CoV-2 RNA is generally detectable in upper respiratoy specimens during the acute phase of infection. The lowest concentration of SARS-CoV-2 viral copies this assay can detect is 131 copies/mL. A negative result does  not preclude SARS-Cov-2 infection and should not be used as the sole basis for treatment or other patient management decisions. A negative result may occur with  improper specimen collection/handling, submission of specimen other than nasopharyngeal swab, presence of viral mutation(s) within the areas targeted by this assay, and inadequate number of viral copies (<131 copies/mL). A negative result must be combined with clinical observations, patient history, and epidemiological information. The expected result is Negative.  Fact Sheet for Patients:  PinkCheek.be  Fact Sheet for Healthcare Providers:  GravelBags.it  This test is no t yet approved or cleared by the [place] FDA and  has been authorized for detection and/or diagnosis of SARS-CoV-2 by FDA under an Emergency Use Authorization (EUA). This EUA will remain  in effect (meaning this test can be used) for the duration of the COVID-19 declaration under Section 564(b)(1) of the Act, 21 U.S.C. section 360bbb-3(b)(1), unless the authorization is terminated or revoked sooner.     Influenza A by PCR NEGATIVE NEGATIVE Final   Influenza B by PCR NEGATIVE NEGATIVE Final    Comment: (NOTE) The Xpert Xpress SARS-CoV-2/FLU/RSV assay is intended as an aid in  the diagnosis of influenza from Nasopharyngeal swab specimens and  should not be used as a sole basis for treatment. Nasal washings and  aspirates are unacceptable for Xpert Xpress SARS-CoV-2/FLU/RSV  testing.  Fact Sheet for Patients: PinkCheek.be  Fact Sheet for Healthcare Providers: GravelBags.it  This test is not yet approved or cleared by the [place] FDA and  has been authorized for detection and/or diagnosis of SARS-CoV-2 by  FDA under an Emergency Use Authorization (EUA). This EUA will remain  in effect (meaning this test can be used) for the  duration of the  Covid-19 declaration under Section 564(b)(1) of the Act, 21  U.S.C. section 360bbb-3(b)(1), unless the authorization is  terminated or revoked. Performed at [facility], [address] [place], [place] [address]   Culture, blood (single)     Status: None (Preliminary result)   Collection Time: 04/07/2020  1:36 PM   Specimen: BLOOD  Result Value Ref Range Status   Specimen Description BLOOD LEFT [facility]  Final   Special Requests   Final    BOTTLES DRAWN AEROBIC ONLY Blood Culture adequate volume   Culture   Final    NO GROWTH 2 DAYS Performed at [facility], [address] [place], [place] [address]    Report Status PENDING  Incomplete  Urine culture     Status: None   Collection Time: 04/26/2020  3:10 PM   Specimen: Urine, Random  Result Value Ref Range Status   Specimen Description   Final    URINE, RANDOM Performed at [facility], [address] [place],  [place] [address]    Special Requests   Final    NONE Performed at [facility], [address] [place], [place] [address]    Culture   Final    NO GROWTH Performed at [facility], [address], [place], [place] [address]    Report Status 04/14/2020 FINAL  Final    Coagulation Studies: Recent Labs    04/19/2020 0358  LABPROT 13.1  INR 1.0    Imaging: MR BRAIN W WO CONTRAST  Result Date: 04/18/2020 CLINICAL DATA:  Initial evaluation for acute delirium. History of renal cell carcinoma. EXAM: MRI HEAD WITHOUT AND WITH CONTRAST TECHNIQUE: Multiplanar, multiecho pulse sequences of the brain and surrounding structures were obtained without and with intravenous contrast. CONTRAST:  7.58mL GADAVIST GADOBUTROL 1 MMOL/ML IV SOLN COMPARISON:  Prior head CT from earlier the same day as well as previous MRI from 08/27/2019. FINDINGS: Brain: Examination degraded by motion artifact. Diffuse prominence of the CSF containing spaces compatible generalized cerebral atrophy.  Mild patchy T2/FLAIR hyperintensity within the periventricular white matter, nonspecific, but most like related chronic microvascular ischemic disease, mild for age. 8 mm focus of diffusion abnormality seen involving the deep white matter of the posterior left frontal centrum semi ovale, consistent with an acute to subacute ischemic infarct (series 5, image 37). No associated hemorrhage or mass effect. No other evidence for acute or subacute ischemia. Again seen is a focus of susceptibility artifact at the left occipital pole, suggesting an underlying microhemorrhage (series 13, image 24). This was also seen on prior brain MRI from 08/27/2019. However, on today's exam, now seen is a small amount of T2/FLAIR signal abnormality suggestive of vasogenic edema (series 15, image 21), not seen on prior exam. No definite associated enhancement evident on this motion degraded exam. A possible small cavernoma that has bled in the interim with associated edema could also be considered. Additional foci of susceptibility artifact involving the left periatrial white matter (series 13, image 30) also seen, stable from previous. There is a small focus of associated nodular enhancement about this lesion (series 18, image 13), stable. No significant surrounding vasogenic edema. Again, this could reflect a small cavernoma, although a small metastatic lesion is not entirely excluded. Additional focus of susceptibility artifact at the right cerebellum also unchanged (series 13, image 17). This lesion demonstrates no appreciable enhancement or associated edema. No other mass lesion, mass effect, or midline shift. Mild ventricular prominence related to global parenchymal volume loss without hydrocephalus. No extra-axial fluid collection. Pituitary gland suprasellar region within normal limits. Midline structures intact. No other abnormal enhancement. Vascular: Major intracranial vascular flow voids are maintained. Skull and upper cervical  spine: Craniocervical junction within normal limits. Bone marrow signal intensity normal. No focal marrow replacing lesion. No scalp soft tissue abnormality. Sinuses/Orbits: Globes and orbital soft tissues within normal limits. Mild mucosal thickening noted within the maxillary sinuses and ethmoidal air cells. Paranasal sinuses are otherwise clear. No mastoid effusion. Inner ear structures grossly normal. Other: None. IMPRESSION: 1. 8 mm acute to subacute ischemic infarct involving the deep white matter of the posterior left frontal centrum semi ovale. No associated hemorrhage or mass effect. 2. Small foci of susceptibility artifact involving the left occipital pole with new small volume surrounding T2/FLAIR signal abnormality, suggesting vasogenic edema. Finding is indeterminate, and could reflect a small cavernoma that has bled in the interim. A small metastatic lesion could also be considered. A short interval follow-up MRI in 2-3 months or earlier if  warranted is recommended for further evaluation. 3. Additional focus of susceptibility artifact at the left periatrial white matter with associated punctate nodular enhancement, stable. Again, finding may reflect a small cavernoma or possibly metastasis. Attention at follow-up recommended. Additional focus of susceptibility artifact at the right cerebellum without associated enhancement or edema is unchanged. 4. Generalized age-related cerebral atrophy with mild chronic microvascular ischemic disease. Electronically Signed   By: Jeannine Boga M.D.   On: 04/24/2020 22:05   ECHOCARDIOGRAM COMPLETE BUBBLE STUDY  Result Date: 04/15/2020    ECHOCARDIOGRAM REPORT   Patient Name:   Hunter Davila Date of Exam: 04/15/2020 Medical Rec #:  [id number]            Height:       74.0 in Accession #:    [id number]           Weight:       201.0 lb Date of Birth:  02-Jul-1956            BSA:          2.178 m Patient Age:    64 years             BP:           141/94  mmHg Patient Gender: M                    HR:           104 bpm. Exam Location:  [facility] Procedure: 2D Echo, Cardiac Doppler, Color Doppler and Saline Contrast Bubble            Study Indications:     Stroke 434.91  History:         Patient has no prior history of Echocardiogram examinations.                  Risk Factors:Hypertension and Diabetes.  Sonographer:     Sherrie Sport RDCS (AE) Referring Phys:  Gray Diagnosing Phys: Nelva Bush MD IMPRESSIONS  1. Left ventricular ejection fraction, by estimation, is 55 to 60%. The left ventricle has normal function. Left ventricular endocardial border not optimally defined to evaluate regional wall motion. There is mild left ventricular hypertrophy. Left ventricular diastolic parameters are consistent with Grade I diastolic dysfunction (impaired relaxation).  2. Right ventricular systolic function is normal. The right ventricular size is normal.  3. Left atrial size was mildly dilated.  4. The mitral valve is grossly normal. No evidence of mitral valve regurgitation.  5. The aortic valve is tricuspid. There is mild thickening of the aortic valve. Aortic valve regurgitation is not visualized. Mild aortic valve sclerosis is present, with no evidence of aortic valve stenosis.  6. Bubble study is nondiagnostic. A small right-to-left shunt cannot be excluded. FINDINGS  Left Ventricle: Left ventricular ejection fraction, by estimation, is 55 to 60%. The left ventricle has normal function. Left ventricular endocardial border not optimally defined to evaluate regional wall motion. The left ventricular internal cavity size was normal in size. There is mild left ventricular hypertrophy. Left ventricular diastolic parameters are consistent with Grade I diastolic dysfunction (impaired relaxation). Right Ventricle: The right ventricular size is normal. No increase in right ventricular wall thickness. Right ventricular systolic function is normal. Left Atrium: Left  atrial size was mildly dilated. Right Atrium: Right atrial size was normal in size. Pericardium: The pericardium was not well visualized. Mitral Valve: The mitral valve is grossly normal. No evidence of mitral valve  regurgitation. Tricuspid Valve: The tricuspid valve is not well visualized. Tricuspid valve regurgitation is not demonstrated. Aortic Valve: The aortic valve is tricuspid. There is mild thickening of the aortic valve. Aortic valve regurgitation is not visualized. Mild aortic valve sclerosis is present, with no evidence of aortic valve stenosis. Aortic valve mean gradient measures 3.0 mmHg. Aortic valve peak gradient measures 5.4 mmHg. Aortic valve area, by VTI measures 3.29 cm. Pulmonic Valve: The pulmonic valve was not well visualized. Pulmonic valve regurgitation is not visualized. No evidence of pulmonic stenosis. Aorta: The aortic root is normal in size and structure. Pulmonary Artery: The pulmonary artery is of normal size. IAS/Shunts: The interatrial septum was not well visualized. Agitated saline contrast was given intravenously to evaluate for intracardiac shunting. Bubble study is nondiagnostic. A small right-to-left shunt cannot be excluded.  LEFT VENTRICLE PLAX 2D LVIDd:         4.73 cm  Diastology LVIDs:         3.28 cm  LV e' medial:    7.29 cm/s LV PW:         1.04 cm  LV E/e' medial:  7.8 LV IVS:        1.32 cm  LV e' lateral:   7.51 cm/s LVOT diam:     2.30 cm  LV E/e' lateral: 7.6 LV SV:         58 LV SV Index:   27 LVOT Area:     4.15 cm  RIGHT VENTRICLE RV S prime:     13.10 cm/s TAPSE (M-mode): 1.6 cm LEFT ATRIUM             Index       RIGHT ATRIUM           Index LA diam:        3.90 cm 1.79 cm/m  RA Area:     10.20 cm LA Vol (A2C):   73.8 ml 33.88 ml/m RA Volume:   17.80 ml  8.17 ml/m LA Vol (A4C):   59.6 ml 27.36 ml/m LA Biplane Vol: 73.7 ml 33.84 ml/m  AORTIC VALVE                   PULMONIC VALVE AV Area (Vmax):    2.86 cm    PV Vmax:        0.84 m/s AV Area (Vmean):    2.93 cm    PV Peak grad:   2.8 mmHg AV Area (VTI):     3.29 cm    RVOT Peak grad: 4 mmHg AV Vmax:           116.00 cm/s AV Vmean:          75.400 cm/s AV VTI:            0.177 m AV Peak Grad:      5.4 mmHg AV Mean Grad:      3.0 mmHg LVOT Vmax:         79.80 cm/s LVOT Vmean:        53.200 cm/s LVOT VTI:          0.140 m LVOT/AV VTI ratio: 0.79  AORTA Ao Root diam: 3.40 cm MITRAL VALVE MV Area (PHT): 3.60 cm    SHUNTS MV Decel Time: 211 msec    Systemic VTI:  0.14 m MV E velocity: 57.20 cm/s  Systemic Diam: 2.30 cm MV A velocity: 91.20 cm/s MV E/A ratio:  0.63 Harrell Gave End MD Electronically signed by Harrell Gave  End MD Signature Date/Time: 04/15/2020/11:04:48 AM    Final     Medications:  I have reviewed the patient's current medications. Scheduled: . amLODipine  10 mg Oral Daily  . amoxicillin-clavulanate  1 tablet Oral Q12H  . baclofen  10 mg Oral TID  . feeding supplement (ENSURE ENLIVE)  237 mL Oral TID BM  .  HYDROmorphone (DILAUDID) injection  1 mg Intravenous Once  . insulin aspart  0-15 Units Subcutaneous TID WC  . levothyroxine  150 mcg Oral Daily  . oxyCODONE  20 mg Oral Q12H  . pantoprazole (PROTONIX) IV  40 mg Intravenous Q12H  . QUEtiapine  50 mg Oral QHS  . senna  2 tablet Oral BID  . tamsulosin  0.4 mg Oral Daily    Assessment/Plan: 64 y.o. male with medical history significant forrenal cell carcinoma on oral chemotherapy, hypothyroidism, diabetes mellitus, hypertension and GERD who was brought into the ER by EMS for evaluation of mental status changes.  His mental status has been gradually getting worse over the last few months but acutely became worse on 10/9. Has been stable since admission.   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  There are areas in the left periatrial white matter and occipital lobe that may correspond to previous findings but do not have these images for comparison.   The  size of the acute infarct is very small and not likely in and of itself the etiology of the mental status changes although it may be contributing along with his increased chemotherapy dose and thyroid abnormalities.  Etiology unclear but may be of embolic etiology.   Hypercoagulability related to cancer a possibility as well.  Carotid dopplers pending.  Echocardiogram shows no cardiac source of emboli with an EF of 55-60%.  Last A1c 7.3, LDL pending.  TSH pending  Recommendations: 1. PT consult, OT consult, Speech therapy 2. Prophylactic therapy-Dual antiplatelet therapy with ASA 81mg  and Plavix 75mg  for three weeks with change to ASA 81mg  alone as monotherapy after that time. 3. Telemetry monitoring 4. Frequent neuro checks 5. BP control with target BP<140/80 6. Follow up MRI of the brain with and without contrast in 2-3 months 7. Oncology to evaluate for patient for need of decrease in chemotherapy dosage 8. Blood sugar management with target A1c<7.0 9. MRI of the lumbar and thoracic spines are pending   LOS: 2 days   Alexis Goodell, MD Neurology  04/15/2020  4:55 PM

## 2020-04-15 NOTE — Progress Notes (Signed)
*  PRELIMINARY RESULTS* Echocardiogram 2D Echocardiogram has been performed.  Sherrie Sport 04/15/2020, 9:40 AM

## 2020-04-15 NOTE — Progress Notes (Signed)
Patient's Mews returned to normal after heart rate came down after initiated MEWS vital signs. Patient is resting comfortably and is not showing any signs of distress.    04/14/20 2333  Assess: MEWS Score  Temp 97.9 F (36.6 C)  BP 114/86  Pulse Rate (!) 129  Resp 16  SpO2 94 %  Assess: MEWS Score  MEWS Temp 0  MEWS Systolic 0  MEWS Pulse 2  MEWS RR 0  MEWS LOC 0  MEWS Score 2  MEWS Score Color Yellow  Assess: if the MEWS score is Yellow or Red  Were vital signs taken at a resting state? Yes  Focused Assessment No change from prior assessment  Early Detection of Sepsis Score *See Row Information* Low  MEWS guidelines implemented *See Row Information* Yes  Treat  MEWS Interventions Other (Comment)  Pain Scale 0-10  Pain Score 4  Pain Type Acute pain  Pain Location Back  Notify: Charge Nurse/RN  Name of Charge Nurse/RN Notified Jasmine, RN  Date Charge Nurse/RN Notified 04/15/20  Time Charge Nurse/RN Notified 0023  Document  Patient Outcome Stabilized after interventions  Progress note created (see row info) Yes

## 2020-04-15 NOTE — Progress Notes (Addendum)
PROGRESS NOTE    Hunter Davila  OIT:[id number] DOB: 1955/07/29 DOA: 04/26/2020 PCP: Patient, No Pcp Per   Chief complaint.  Altered mental status.  Brief Narrative:  Hunter Davila a 64 y.o.malewith medical history significant forrenal cell carcinoma on oral chemotherapy, hypothyroidism, diabetes mellitus, hypertension and GERD who was brought into the ER by EMS for evaluation of mental status changes. Per patient friend, his the POA, patient has not been sleeping at night for a while.  He has severe right flank pain, he was taking pain medicine, but is not working.  The pain is worse at night, he frequently get up at night taking shower to try to reduce the pain.  Recently, he was seen by palliative care, he was started on long-acting pain medicine as well as as needed pain medicine.  Still cannot sleep well.  His mental status has been gradually getting worse over the last few months.  He is easily confused, but sometimes his mind is clear. Patient also had a severe back pain initially on the right side.  Recently the pain has spread to the midline and the left side.  Patient also had incontinent of urine, weakness in bilateral legs.  He has difficulty with walking. Patient also had a significant constipation, he has been nauseated and vomiting for the last 2 or 3 months.  Before admission, he had an episode of vomiting.  Chest x-ray showed left lower lobe pneumonia.  MRI of the brain showed a 2 suspicious metastasis in the brain, small infarct.  Neurology consult obtained.   Assessment & Plan:   Principal Problem:   Acute metabolic encephalopathy Active Problems:   Renal cancer ([place])   Diabetes mellitus without complication (HCC)   Hypertension   GERD (gastroesophageal reflux disease)   Hypothyroidism   CAP (community acquired pneumonia)   Lactic acidosis   Anemia of chronic disease   Aspiration pneumonia (HCC)   Failure to thrive in adult   Iron deficiency  anemia due to chronic blood loss   Severe protein-calorie malnutrition (HCC)   Severe back pain   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[place])  #1.  Acute metabolic encephalopathy. Multifactorial. Chemo medication may played a role, dose decreased by oncology. Patient also has sleep deprivation due to severe pain in the back. Condition seem to be gradually improving.  He slept well last night with the Seroquel.  #2.  Severe back pain. Most likely secondary to metastatic renal cancer.  However, patient has worsening night pain, the pain also localized in the spine as well as on the left side.  Patient also had incontinent urine and leg weakness. Continue scheduled and as needed pain medicine for pain control. MRI with contrast of the lumbar spine and thoracic spine is pending. Patient will be also followed by oncology.  3.  Left lower lobe aspiration pneumonia.   Appears to be secondary to vomiting.  Condition improving.  Antibiotic changed to Augmentin for total course of 5 days of antibiotics.  4.  Severe constipation. Start senna scheduled as well as as needed lactulose.  5.  Failure to thrive. Continue physical therapy occupational therapy.  Patient will need nursing home placement.  6.  Renal cell carcinoma with brain mets. Followed by oncology.  7.  Acute white matter stroke. Followed by neurology.  8.  Lactic acidosis. Secondary to Metformin and cancer.  Improved.  9.  Type 2 diabetes. Continue sliding scale insulin.  10.  Iron deficient anemia. Received IV iron, B12 level within normal  limits. Patient required 1 unit PRBC transfusion in the emergency room with hemoglobin 6.9.  He also has positive occult blood in stool. Consulted GI for EGD.  11.  Severe protein calorie malnutrition. Continue supplement.   DVT prophylaxis: SCDs Code Status: DNR Family Communication: None  .   Status is: Inpatient  Remains inpatient appropriate because:Inpatient level of care appropriate due to  severity of illness, work-up is not completed.   Dispo: The patient is from: Home              Anticipated d/c is to: SNF              Anticipated d/c date is:1-2 days              Patient currently is not medically stable to d/c.        I/O last 3 completed shifts: In: 380 [Blood:380] Out: 700 [Urine:700] No intake/output data recorded.     Consultants:   Neurology, oncology.  Procedures: None  Antimicrobials:  Augmentin.  Subjective: Spoke with the nurse, patient slept well last night.  He does not have a second confusion this morning. Denies any headache or dizziness. No short of breath or cough. No chest pain.  No palpitation. No abdominal pain or nausea vomiting. No fever or chills.  Objective: Vitals:   04/14/20 2333 04/15/20 0156 04/15/20 0337 04/15/20 0731  BP: 114/86 125/85 (!) 141/94 117/82  Pulse: (!) 129 (!) 102 (!) 104 (!) 103  Resp: 16 17 18 18   Temp: 97.9 F (36.6 C) 97.8 F (36.6 C) 98.7 F (37.1 C) 98.6 F (37 C)  TempSrc: Oral Oral Oral   SpO2: 94% 96% 97% 96%  Weight:      Height:        Intake/Output Summary (Last 24 hours) at 04/15/2020 1001 Last data filed at 04/14/2020 1800 Gross per 24 hour  Intake --  Output 350 ml  Net -350 ml   Filed Weights   04/29/2020 0345  Weight: 91.2 kg    Examination:  General exam: Appears calm and comfortable  Respiratory system: Clear to auscultation. Respiratory effort normal. Cardiovascular system: S1 & S2 heard, RRR. No JVD, murmurs, rubs, gallops or clicks. No pedal edema. Gastrointestinal system: Abdomen is nondistended, soft and nontender. No organomegaly or masses felt. Normal bowel sounds heard. Central nervous system: Alert and oriented x3. No focal neurological deficits. Extremities: Symmetric  Skin: No rashes, lesions or ulcers Psychiatry: Mood & affect appropriate.     Data Reviewed: I have personally reviewed following labs and imaging studies  CBC: Recent Labs  Lab  04/27/2020 0358 05/04/2020 1956 04/14/20 0150 04/15/20 0014 04/15/20 0318  WBC 6.7 5.0 4.6 4.7 4.6  NEUTROABS 5.3 3.8  --   --  3.1  HGB 7.9* 6.9* 7.5* 8.0* 8.3*  HCT 27.1* 23.3* 25.4* 27.0* 27.9*  MCV 76.1* 74.9* 74.9* 76.7* 76.6*  PLT 352 323 349 352 101   Basic Metabolic Panel: Recent Labs  Lab 04/07/2020 0358 04/14/20 0150  NA 135 141  K 4.5 4.0  CL 101 106  CO2 20* 24  GLUCOSE 167* 108*  BUN 12 10  CREATININE 0.99 0.99  CALCIUM 8.4* 8.4*   GFR: Estimated Creatinine Clearance: 89.9 mL/min (by C-G formula based on SCr of 0.99 mg/dL). Liver Function Tests: Recent Labs  Lab 04/07/2020 0358 04/14/20 0150  AST 26 24  ALT 10 9  ALKPHOS 80 69  BILITOT 0.3 0.3  PROT 7.9 7.2  ALBUMIN 2.7* 2.3*  Recent Labs  Lab 04/08/2020 0358  LIPASE 20   No results for input(s): AMMONIA in the last 168 hours. Coagulation Profile: Recent Labs  Lab 04/04/2020 0358  INR 1.0   Cardiac Enzymes: No results for input(s): CKTOTAL, CKMB, CKMBINDEX, TROPONINI in the last 168 hours. BNP (last 3 results) No results for input(s): PROBNP in the last 8760 hours. HbA1C: No results for input(s): HGBA1C in the last 72 hours. CBG: Recent Labs  Lab 04/14/20 0758 04/14/20 1222 04/14/20 1551 04/14/20 2103 04/15/20 0732  GLUCAP 122* 135* 141* 102* 120*   Lipid Profile: No results for input(s): CHOL, HDL, LDLCALC, TRIG, CHOLHDL, LDLDIRECT in the last 72 hours. Thyroid Function Tests: No results for input(s): TSH, T4TOTAL, FREET4, T3FREE, THYROIDAB in the last 72 hours. Anemia Panel: Recent Labs    04/14/20 0416 04/14/20 1534  VITAMINB12  --  305  FERRITIN 188  --   TIBC 217*  --   IRON 33*  --    Sepsis Labs: Recent Labs  Lab 04/18/2020 0358 04/29/2020 0641 04/17/2020 1956  LATICACIDVEN 2.4* 2.5* 1.4    Recent Results (from the past 240 hour(s))  Blood culture (routine single)     Status: None (Preliminary result)   Collection Time: 04/21/2020  3:58 AM   Specimen: BLOOD  Result Value  Ref Range Status   Specimen Description BLOOD BLOOD RIGHT FOREARM  Final   Special Requests   Final    BOTTLES DRAWN AEROBIC AND ANAEROBIC Blood Culture results may not be optimal due to an excessive volume of blood received in culture bottles   Culture   Final    NO GROWTH 2 DAYS Performed at [facility], [address], [place], [place] [address]    Report Status PENDING  Incomplete  Respiratory Panel by RT PCR (Flu A&B, Covid) - Nasopharyngeal Swab     Status: None   Collection Time: 05/03/2020  3:58 AM   Specimen: Nasopharyngeal Swab  Result Value Ref Range Status   SARS Coronavirus 2 by RT PCR NEGATIVE NEGATIVE Final    Comment: (NOTE) SARS-CoV-2 target nucleic acids are NOT DETECTED.  The SARS-CoV-2 RNA is generally detectable in upper respiratoy specimens during the acute phase of infection. The lowest concentration of SARS-CoV-2 viral copies this assay can detect is 131 copies/mL. A negative result does not preclude SARS-Cov-2 infection and should not be used as the sole basis for treatment or other patient management decisions. A negative result may occur with  improper specimen collection/handling, submission of specimen other than nasopharyngeal swab, presence of viral mutation(s) within the areas targeted by this assay, and inadequate number of viral copies (<131 copies/mL). A negative result must be combined with clinical observations, patient history, and epidemiological information. The expected result is Negative.  Fact Sheet for Patients:  PinkCheek.be  Fact Sheet for Healthcare Providers:  GravelBags.it  This test is no t yet approved or cleared by the [place] FDA and  has been authorized for detection and/or diagnosis of SARS-CoV-2 by FDA under an Emergency Use Authorization (EUA). This EUA will remain  in effect (meaning this test can be used) for the duration of the COVID-19  declaration under Section 564(b)(1) of the Act, 21 U.S.C. section 360bbb-3(b)(1), unless the authorization is terminated or revoked sooner.     Influenza A by PCR NEGATIVE NEGATIVE Final   Influenza B by PCR NEGATIVE NEGATIVE Final    Comment: (NOTE) The Xpert Xpress SARS-CoV-2/FLU/RSV assay is intended as an aid in  the  diagnosis of influenza from Nasopharyngeal swab specimens and  should not be used as a sole basis for treatment. Nasal washings and  aspirates are unacceptable for Xpert Xpress SARS-CoV-2/FLU/RSV  testing.  Fact Sheet for Patients: PinkCheek.be  Fact Sheet for Healthcare Providers: GravelBags.it  This test is not yet approved or cleared by the [place] FDA and  has been authorized for detection and/or diagnosis of SARS-CoV-2 by  FDA under an Emergency Use Authorization (EUA). This EUA will remain  in effect (meaning this test can be used) for the duration of the  Covid-19 declaration under Section 564(b)(1) of the Act, 21  U.S.C. section 360bbb-3(b)(1), unless the authorization is  terminated or revoked. Performed at [facility], [address] [place], [place] [address]   Culture, blood (single)     Status: None (Preliminary result)   Collection Time: 04/07/2020  1:36 PM   Specimen: BLOOD  Result Value Ref Range Status   Specimen Description BLOOD LEFT [facility]  Final   Special Requests   Final    BOTTLES DRAWN AEROBIC ONLY Blood Culture adequate volume   Culture   Final    NO GROWTH 2 DAYS Performed at [facility], [address] [place], [place] [address]    Report Status PENDING  Incomplete  Urine culture     Status: None   Collection Time: 04/18/2020  3:10 PM   Specimen: Urine, Random  Result Value Ref Range Status   Specimen Description   Final    URINE, RANDOM Performed at [facility], [address] [place], [place] [address]    Special Requests   Final     NONE Performed at [facility], [address], [place], [place] [address]    Culture   Final    NO GROWTH Performed at [facility], [place] [address] [place], [place] [address]    Report Status 04/14/2020 FINAL  Final         Radiology Studies: MR BRAIN W WO CONTRAST  Result Date: 04/16/2020 CLINICAL DATA:  Initial evaluation for acute delirium. History of renal cell carcinoma. EXAM: MRI HEAD WITHOUT AND WITH CONTRAST TECHNIQUE: Multiplanar, multiecho pulse sequences of the brain and surrounding structures were obtained without and with intravenous contrast. CONTRAST:  7.24mL GADAVIST GADOBUTROL 1 MMOL/ML IV SOLN COMPARISON:  Prior head CT from earlier the same day as well as previous MRI from 08/27/2019. FINDINGS: Brain: Examination degraded by motion artifact. Diffuse prominence of the CSF containing spaces compatible generalized cerebral atrophy. Mild patchy T2/FLAIR hyperintensity within the periventricular white matter, nonspecific, but most like related chronic microvascular ischemic disease, mild for age. 8 mm focus of diffusion abnormality seen involving the deep white matter of the posterior left frontal centrum semi ovale, consistent with an acute to subacute ischemic infarct (series 5, image 37). No associated hemorrhage or mass effect. No other evidence for acute or subacute ischemia. Again seen is a focus of susceptibility artifact at the left occipital pole, suggesting an underlying microhemorrhage (series 13, image 24). This was also seen on prior brain MRI from 08/27/2019. However, on today's exam, now seen is a small amount of T2/FLAIR signal abnormality suggestive of vasogenic edema (series 15, image 21), not seen on prior exam. No definite associated enhancement evident on this motion degraded exam. A possible small cavernoma that has bled in the interim with associated edema could also be considered. Additional foci of susceptibility artifact involving the left  periatrial white matter (series 13,  image 30) also seen, stable from previous. There is a small focus of associated nodular enhancement about this lesion (series 18, image 13), stable. No significant surrounding vasogenic edema. Again, this could reflect a small cavernoma, although a small metastatic lesion is not entirely excluded. Additional focus of susceptibility artifact at the right cerebellum also unchanged (series 13, image 17). This lesion demonstrates no appreciable enhancement or associated edema. No other mass lesion, mass effect, or midline shift. Mild ventricular prominence related to global parenchymal volume loss without hydrocephalus. No extra-axial fluid collection. Pituitary gland suprasellar region within normal limits. Midline structures intact. No other abnormal enhancement. Vascular: Major intracranial vascular flow voids are maintained. Skull and upper cervical spine: Craniocervical junction within normal limits. Bone marrow signal intensity normal. No focal marrow replacing lesion. No scalp soft tissue abnormality. Sinuses/Orbits: Globes and orbital soft tissues within normal limits. Mild mucosal thickening noted within the maxillary sinuses and ethmoidal air cells. Paranasal sinuses are otherwise clear. No mastoid effusion. Inner ear structures grossly normal. Other: None. IMPRESSION: 1. 8 mm acute to subacute ischemic infarct involving the deep white matter of the posterior left frontal centrum semi ovale. No associated hemorrhage or mass effect. 2. Small foci of susceptibility artifact involving the left occipital pole with new small volume surrounding T2/FLAIR signal abnormality, suggesting vasogenic edema. Finding is indeterminate, and could reflect a small cavernoma that has bled in the interim. A small metastatic lesion could also be considered. A short interval follow-up MRI in 2-3 months or earlier if warranted is recommended for further evaluation. 3. Additional focus of  susceptibility artifact at the left periatrial white matter with associated punctate nodular enhancement, stable. Again, finding may reflect a small cavernoma or possibly metastasis. Attention at follow-up recommended. Additional focus of susceptibility artifact at the right cerebellum without associated enhancement or edema is unchanged. 4. Generalized age-related cerebral atrophy with mild chronic microvascular ischemic disease. Electronically Signed   By: Jeannine Boga M.D.   On: 05/01/2020 22:05        Scheduled Meds: . amLODipine  10 mg Oral Daily  . amoxicillin-clavulanate  1 tablet Oral Q12H  . baclofen  10 mg Oral TID  . insulin aspart  0-15 Units Subcutaneous TID WC  . levothyroxine  150 mcg Oral Daily  . oxyCODONE  20 mg Oral Q12H  . pantoprazole (PROTONIX) IV  40 mg Intravenous Q12H  . QUEtiapine  50 mg Oral QHS  . senna  2 tablet Oral BID  . tamsulosin  0.4 mg Oral Daily   Continuous Infusions:   LOS: 2 days    Time spent: 36 minutes    Sharen Hones, MD [facility]   To contact the attending provider between 7A-7P or the covering provider during after hours 7P-7A, please log into the web site [email or web address] and access using universal [place] password for that web site. If you do not have the password, please call the hospital operator.  04/15/2020, 10:01 AM

## 2020-04-15 NOTE — Progress Notes (Signed)
PHARMACIST - PHYSICIAN COMMUNICATION DR:   [place] CONCERNING: Antibiotic IV to Oral Route Change Policy  RECOMMENDATION: This patient is receiving Azithromycin by the intravenous route.  Based on criteria approved by the Pharmacy and Therapeutics Committee, the antibiotic(s) is/are being converted to the equivalent oral dose form(s).   DESCRIPTION: These criteria include:  Patient being treated for a respiratory tract infection, urinary tract infection, cellulitis or clostridium difficile associated diarrhea if on metronidazole  The patient is not neutropenic and does not exhibit a GI malabsorption state  The patient is eating (either orally or via tube) and/or has been taking other orally administered medications for a least 24 hours  The patient is improving clinically and has a Tmax < 100.5  If you have questions about this conversion, please contact the [place], PharmD, BCPS Clinical Pharmacist 04/15/2020 8:42 AM

## 2020-04-15 NOTE — Progress Notes (Signed)
Initial Nutrition Assessment  DOCUMENTATION CODES:   Not applicable  INTERVENTION:  Ensure Enlive po TID, each supplement provides 350 kcal and 20 grams of protein  Provided "Constipation" and "Nausea and Vomiting" handouts from Academy of [employer] and Dietetics Oncology Edition   NUTRITION DIAGNOSIS:   Increased nutrient needs related to cancer and cancer related treatments (metastatic renal cancer on oral chemotherapy) as evidenced by estimated needs.  GOAL:   Patient will meet greater than or equal to 90% of their needs    MONITOR:   PO intake, Labs, Weight trends, Supplement acceptance  REASON FOR ASSESSMENT:   Malnutrition Screening Tool    ASSESSMENT:  64 year old male with history significant for renal cell carcinoma on oral chemotherapy, hypothyroidism, DM, HTN and GERD presented with prolonged episode of emesis with associated intermittent confusion. Pt admitted with acute metabolic encephalopathy.  Pt off floor at RD first attempt to see, sleeping soundly this afternoon at second attempt. Will obtain nutrition history and perform exam at follow-up. Per notes worsening mental status over the past few months, easily confused but sometimes mind is clear. MRI of brain shows 2 suspicious metastasis in brain, neurology and oncology consulted. Pt reports severe right flank pain that is worse at night and has not been sleeping, as well as significant constipation, nausea with vomiting for past 2-3 months. "Constipation" and "Nausea and Vomiting" handouts from Academy of Nutrition and Dietetics Oncology Edition left on counter.   Per flowsheets, he consumed 80% of lunch meal today. Will order Ensure supplement TID to aid with meeting needs.  Per chart, weights have trended down ~15 lbs (7.1%) in the last 7 months; insignificant however concerning given metastatic renal carcinoma currently undergoing oral chemotherapy. 92 kg on 03/12/20  91.2 kg on 02/27/20 92.4 kg on  01/30/20 94.7 kg on 12/26/19  94.3 kg on 11/28/19 93.3 kg on 10/31/19 98.2 kg on 09/12/19   Medications reviewed and include: Augmentin, Baclofen, SSI, Oxycontin, Protonix, Seroquel  Labs: CBGs 187,120,102,141 03/19/20 A1c 7.3  NUTRITION - FOCUSED PHYSICAL EXAM: Unable to complete at this time, will plan to perform at follow-up   Diet Order:   Diet Order            Diet Carb Modified Fluid consistency: Thin; Room service appropriate? Yes  Diet effective now                 EDUCATION NEEDS:   Not appropriate for education at this time  Skin:  Skin Assessment: Skin Integrity Issues: Skin Integrity Issues:: Other (Comment) Other: rash;bilateral abdomen  Last BM:  10/10  Height:   Ht Readings from Last 1 Encounters:  04/16/2020 6\' 2"  (1.88 m)    Weight:   Wt Readings from Last 1 Encounters:  04/12/2020 91.2 kg    BMI:  Body mass index is 25.81 kg/m.  Estimated Nutritional Needs:   Kcal:  0865-7846  Protein:  120-130  Fluid:  > 2.3 L/day   Lajuan Lines, RD, LDN Clinical Nutrition After Hours/Weekend Pager # in [place]

## 2020-04-16 ENCOUNTER — Encounter: Payer: Self-pay | Admitting: Internal Medicine

## 2020-04-16 DIAGNOSIS — D649 Anemia, unspecified: Secondary | ICD-10-CM

## 2020-04-16 DIAGNOSIS — D638 Anemia in other chronic diseases classified elsewhere: Secondary | ICD-10-CM | POA: Diagnosis not present

## 2020-04-16 DIAGNOSIS — C641 Malignant neoplasm of right kidney, except renal pelvis: Secondary | ICD-10-CM | POA: Diagnosis not present

## 2020-04-16 DIAGNOSIS — G9341 Metabolic encephalopathy: Secondary | ICD-10-CM | POA: Diagnosis not present

## 2020-04-16 DIAGNOSIS — J189 Pneumonia, unspecified organism: Secondary | ICD-10-CM | POA: Diagnosis not present

## 2020-04-16 DIAGNOSIS — I6381 Other cerebral infarction due to occlusion or stenosis of small artery: Secondary | ICD-10-CM

## 2020-04-16 LAB — GLUCOSE, CAPILLARY
Glucose-Capillary: 97 mg/dL (ref 70–99)
Glucose-Capillary: 131 mg/dL — ABNORMAL HIGH (ref 70–99)
Glucose-Capillary: 169 mg/dL — ABNORMAL HIGH (ref 70–99)
Glucose-Capillary: 102 mg/dL — ABNORMAL HIGH (ref 70–99)

## 2020-04-16 LAB — LIPID PANEL
Cholesterol: 170 mg/dL (ref 0–200)
HDL: 47 mg/dL (ref >40)
LDL Cholesterol: 88 mg/dL (ref 0–99)
Total CHOL/HDL Ratio: 3.6 RATIO
Triglycerides: 175 mg/dL — ABNORMAL HIGH (ref <150)
VLDL: 35 mg/dL (ref 0–40)

## 2020-04-16 LAB — BASIC METABOLIC PANEL
Anion gap: 10 (ref 5–15)
BUN: 10 mg/dL (ref 8–23)
CO2: 25 mmol/L (ref 22–32)
Calcium: 8.2 mg/dL — ABNORMAL LOW (ref 8.9–10.3)
Chloride: 105 mmol/L (ref 98–111)
Creatinine, Ser: 1.24 mg/dL (ref 0.61–1.24)
GFR, Estimated: >60 mL/min (ref >60)
Glucose, Bld: 170 mg/dL — ABNORMAL HIGH (ref 70–99)
Potassium: 3.8 mmol/L (ref 3.5–5.1)
Sodium: 140 mmol/L (ref 135–145)

## 2020-04-16 LAB — CBC
HCT: 29 % — ABNORMAL LOW (ref 39.0–52.0)
Hemoglobin: 8.5 g/dL — ABNORMAL LOW (ref 13.0–17.0)
MCH: 22.8 pg — ABNORMAL LOW (ref 26.0–34.0)
MCHC: 29.3 g/dL — ABNORMAL LOW (ref 30.0–36.0)
MCV: 77.7 fL — ABNORMAL LOW (ref 80.0–100.0)
Platelets: 366 10*3/uL (ref 150–400)
RBC: 3.73 MIL/uL — ABNORMAL LOW (ref 4.22–5.81)
RDW: 19.6 % — ABNORMAL HIGH (ref 11.5–15.5)
WBC: 6.4 10*3/uL (ref 4.0–10.5)
nRBC: 0 % (ref 0.0–0.2)

## 2020-04-16 LAB — TSH: TSH: 7.73 u[IU]/mL — ABNORMAL HIGH (ref 0.350–4.500)

## 2020-04-16 NOTE — Evaluation (Signed)
Physical Therapy Evaluation (Co-eval with OT) Patient Details Name: Hunter Davila MRN: [id number] DOB: Jun 07, 1956 Today's Date: 04/16/2020   History of Present Illness  Hunter Davila is a 58yoM c PMH: StgIV renal CA c mets, HTN, DM, GERD, hypoTSH. Pt comes to [facility] on 10/10 c AMS. Patient is followed by [facility].  Patient had systemic chemotherapy, radiation therapy and currently is receiving oral chemotherapy. Of note, pt recently hospitalized with similar presentation in September. 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  Clinical Impression  64 yo Male presents to hospital with altered mental status. He was living at home, alone. He does have friends/family that assist with errands including meals. However he does not have anyone that can stay with him full time. Patient was up in chair at start of PT evaluation. He does exhibit good strength in BLE (mild weakness noted in right hip). He does report increased low back pain during session which could have affected his mobility. Patient was min A for sit<>Stand transfer from recliner and regular height chair. He is a tall gentleman which does make transfers more difficult. He was able to ambulate in room at least 20 feet without AD with min A for safety with reciprocal gait pattern, although does reach out for furniture to steady self and exhibits slight lateral loss of balance. Patient's vitals monitored with good SPO2 levels, however HR increased to 138 after walking and after sitting for >5 min was still in 130's. He reports feeling mildly short of breath. Due to limited gait ability, elevated HR and decreased safety awareness, recommend SNF rehab upon discharge for 24 hour supervision/assistance. Patient states he will speak with his friend about this. He did seem open to the idea after walking and realizing his decreased mobility;  He would benefit from additional skilled PT Intervention to  improve strength, balance and mobility;     Follow Up Recommendations SNF;Supervision/Assistance - 24 hour    Equipment Recommendations   (to be determined;)    Recommendations for Other Services       Precautions / Restrictions Precautions Precautions: Fall Restrictions Weight Bearing Restrictions: No      Mobility  Bed Mobility                  Transfers Overall transfer level: Needs assistance   Transfers: Sit to/from Stand Sit to Stand: Min assist         General transfer comment: with cues for hand placement for safety x1 rep from recliner x1 rep from regular height chair without arm rests;  Ambulation/Gait Ambulation/Gait assistance: Min assist Gait Distance (Feet): 20 Feet Assistive device: None Gait Pattern/deviations: Step-through pattern Gait velocity: decreased   General Gait Details: ambulates with normal base of support, reciprocal gait pattern, slight shuffled steps with decreased heel strike, slower gait speed, often reaches out for furniture for safety; does exhibit slight instability laterally with gait;  Stairs            Wheelchair Mobility    Modified Rankin (Stroke Patients Only)       Balance Overall balance assessment: Needs assistance Sitting-balance support: No upper extremity supported;Feet supported Sitting balance-Leahy Scale: Good     Standing balance support: No upper extremity supported Standing balance-Leahy Scale: Fair Standing balance comment: requires CGA to min A when standing unsupported, exhibits slight instability especially laterally with gait and with standing tasks; appears slightly impulsive  Pertinent Vitals/Pain Pain Assessment: No/denies pain (Simultaneous filing. User may not have seen previous data.) Pain Score: 6  Pain Location: back pain Pain Descriptors / Indicators: Aching;Sore Pain Intervention(s): Limited activity within patient's  tolerance;Monitored during session;Repositioned    Home Living Family/patient expects to be discharged to:: Private residence Living Arrangements: Alone Available Help at Discharge: Friend(s) Type of Home: House Home Access: Stairs to enter Entrance Stairs-Rails: Chemical engineer of Steps: 7 Home Layout: One level Home Equipment: None      Prior Function Level of Independence: Independent         Comments: friends help with transport, groceries,meals     Hand Dominance   Dominant Hand: Right    Extremity/Trunk Assessment   Upper Extremity Assessment Upper Extremity Assessment: Defer to OT evaluation    Lower Extremity Assessment Lower Extremity Assessment: RLE deficits/detail;LLE deficits/detail RLE Deficits / Details: hip grossly 4/5, knee 5/5, ankle 5/5; RLE Sensation: WNL RLE Coordination: WNL LLE Deficits / Details: grossly 5/5 LLE Sensation: WNL LLE Coordination: WNL    Cervical / Trunk Assessment Cervical / Trunk Assessment:  (WFL, does report less back pain with flexed posture)  Communication   Communication: No difficulties  Cognition Arousal/Alertness: Awake/alert Behavior During Therapy: WFL for tasks assessed/performed Overall Cognitive Status: Within Functional Limits for tasks assessed                                 General Comments: patient able to answer questions related to situation and oriented to person, place and date; however he does occasionally show some confusion- example after walking in room he tried to sit on the dresser rather than in the chair.      General Comments      Exercises     Assessment/Plan    PT Assessment Patient needs continued PT services  PT Problem List Decreased strength;Decreased mobility;Decreased safety awareness;Decreased activity tolerance;Cardiopulmonary status limiting activity;Decreased balance;Pain       PT Treatment Interventions Therapeutic exercise;Gait  training;Balance training;Stair training;Neuromuscular re-education;Functional mobility training;Therapeutic activities;Patient/family education;DME instruction    PT Goals (Current goals can be found in the Care Plan section)  Acute Rehab PT Goals Patient Stated Goal: "improve mobility, reduce back pain" PT Goal Formulation: With patient Time For Goal Achievement: 04/30/20 Potential to Achieve Goals: Good    Frequency 7X/week   Barriers to discharge Decreased caregiver support;Inaccessible home environment has 7 steps to enter, lives alone;    Co-evaluation               AM-PAC PT "6 Clicks" Mobility  Outcome Measure Help needed turning from your back to your side while in a flat bed without using bedrails?: None Help needed moving from lying on your back to sitting on the side of a flat bed without using bedrails?: A Little Help needed moving to and from a bed to a chair (including a wheelchair)?: A Little Help needed standing up from a chair using your arms (e.g., wheelchair or bedside chair)?: A Little Help needed to walk in hospital room?: A Little Help needed climbing 3-5 steps with a railing? : A Lot 6 Click Score: 18    End of Session Equipment Utilized During Treatment: Gait belt Activity Tolerance: Patient limited by fatigue Patient left: in chair;with call bell/phone within reach;with chair alarm set Nurse Communication: Mobility status PT Visit Diagnosis: Unsteadiness on feet (R26.81);Difficulty in walking, not elsewhere classified (R26.2)    Time:  8242-9980 PT Time Calculation (min) (ACUTE ONLY): 19 min   Charges:   PT Evaluation $PT Eval Low Complexity: 1 Low            Jennilyn Esteve PT, DPT 04/16/2020, 2:58 PM

## 2020-04-16 NOTE — Evaluation (Signed)
Occupational Therapy Evaluation Patient Details Name: Hunter Davila MRN: [id number] DOB: July 09, 1955 Today's Date: 04/16/2020    History of Present Illness Hunter Davila is a 33yoM c PMH: StgIV renal CA c mets, HTN, DM, GERD, hypoTSH. Pt comes to [facility] on 10/10 c AMS. Patient is followed by [facility].  Patient had systemic chemotherapy, radiation therapy and currently is receiving oral chemotherapy. Of note, pt recently hospitalized with similar presentation in September. 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   Clinical Impression   Hunter Davila was seen for OT/PT co-evaluation this date. Prior to hospital admission, pt was living alone in a 1 level home with 7 STE. Pt reports he was independent with all ADL management and that friends assist him with IADL tasks such as driving and grocery shopping. Currently pt demonstrates impairments as described below (See OT problem list) which functionally limit his ability to perform ADL/self-care tasks. Pt currently requires MIN A for functional mobility, MIN A for exertional tasks including LB dressing, bathing, etc as well as consistent supervision due to decreased cognition and safety awareness.  Pt would benefit from skilled OT services to address noted impairments and functional limitations (see below for any additional details) in order to maximize safety and independence while minimizing falls risk and caregiver burden. Upon hospital discharge, recommend STR to maximize pt safety and return to PLOF.      Follow Up Recommendations  SNF;Supervision - Intermittent    Equipment Recommendations  3 in 1 bedside commode    Recommendations for Other Services       Precautions / Restrictions Precautions Precautions: Fall Restrictions Weight Bearing Restrictions: No      Mobility Bed Mobility               General bed mobility comments: Deferred. Pt in recliner at start/end of  session.  Transfers Overall transfer level: Needs assistance   Transfers: Sit to/from Stand Sit to Stand: Min assist         General transfer comment: with cues for hand placement for safety x1 rep from recliner x1 rep from regular height chair without arm rests with min A;    Balance Overall balance assessment: Needs assistance Sitting-balance support: No upper extremity supported;Feet supported Sitting balance-Leahy Scale: Good Sitting balance - Comments: Steady static sitting, reaching within BOS.   Standing balance support: No upper extremity supported Standing balance-Leahy Scale: Fair Standing balance comment: requires CGA to min A when standing unsupported, exhibits slight instability especially laterally with gait and with standing tasks; appears slightly impulsive                           ADL either performed or assessed with clinical judgement   ADL Overall ADL's : Needs assistance/impaired                                       General ADL Comments: Pt functionally limited by impaired cognition, decreased activity tolerance, and cardiopulmonary status. No focal weakness appreciated, however pt is limited by decreased safety awarness in addition to decreased awareness of deficits. Supervision for safety during brief functional transfer in room, pt HR noted to reach 138 with limited mobility (was in 120's at rest). Anticipate supervision to MIN A for exertional ADL management including LB dressing and bathing tasks.  Vision Baseline Vision/History: Wears glasses Wears Glasses: Reading only Additional Comments: Pt provides conflicting information regarding vision during session, and is unable to state if overall vision is worse from baseline. He states he noticed increased blurry vision and tried on his friends glasses but "they didn't help as much you'd expect".     Perception     Praxis      Pertinent Vitals/Pain Pain Assessment:  0-10 Pain Score: 6  Pain Location: back pain Pain Descriptors / Indicators: Aching;Sore Pain Intervention(s): Limited activity within patient's tolerance;Monitored during session;Repositioned;Premedicated before session     Hand Dominance Right   Extremity/Trunk Assessment Upper Extremity Assessment Upper Extremity Assessment: Overall WFL for tasks assessed (Grossly 4/5 t/o BUE with fair FMC and GMC appreciated.)   Lower Extremity Assessment Lower Extremity Assessment: Overall WFL for tasks assessed RLE Deficits / Details: hip grossly 4/5, knee 5/5, ankle 5/5; RLE Sensation: WNL RLE Coordination: WNL LLE Deficits / Details: grossly 5/5 LLE Sensation: WNL LLE Coordination: WNL   Cervical / Trunk Assessment Cervical / Trunk Assessment:  (WFL, does report less back pain with flexed posture)   Communication Communication Communication: No difficulties   Cognition Arousal/Alertness: Awake/alert Behavior During Therapy: WFL for tasks assessed/performed Overall Cognitive Status: Within Functional Limits for tasks assessed                                 General Comments: patient able to answer questions related to situation and oriented to person, place and date; however he does occasionally show some confusion- example after walking in room he tried to sit on the dresser rather than in the chair.   General Comments       Exercises Other Exercises Other Exercises: Pt educated on role of OT in acute setting, safe use of AE/DME for ADL management, routines modifications to support safety and functional independence upon hospital DC. Other Exercises: OT/PT facilitate safe functional mobility in room, with CGA and education on safety and transfer techniques t/o. Pt fatigues quickly and is unable to progress further with ADL management.   Shoulder Instructions      Home Living Family/patient expects to be discharged to:: Private residence Living Arrangements:  Alone Available Help at Discharge: Friend(s) Type of Home: House Home Access: Stairs to enter [employer] of Steps: 7 Entrance Stairs-Rails: Left;Right Home Layout: One level     Bathroom Shower/Tub: Occupational psychologist: Handicapped height Bathroom Accessibility: Yes   Home Equipment: None          Prior Functioning/Environment Level of Independence: Independent        Comments: friends help with transport, groceries,meals        OT Problem List: Decreased coordination;Cardiopulmonary status limiting activity;Decreased activity tolerance;Decreased safety awareness;Impaired balance (sitting and/or standing);Decreased knowledge of use of DME or AE;Impaired vision/perception;Decreased cognition      OT Treatment/Interventions: Self-care/ADL training;Therapeutic exercise;DME and/or AE instruction;Patient/family education;Therapeutic activities;Balance training;Energy conservation    OT Goals(Current goals can be found in the care plan section) Acute Rehab OT Goals Patient Stated Goal: "improve mobility, reduce back pain" OT Goal Formulation: With patient Time For Goal Achievement: 04/30/20 Potential to Achieve Goals: Good  OT Frequency: Min 1X/week   Barriers to D/C: Decreased caregiver support;Inaccessible home environment          Co-evaluation PT/OT/SLP Co-Evaluation/Treatment: Yes Reason for Co-Treatment: Necessary to address cognition/behavior during functional activity;For patient/therapist safety;To address functional/ADL transfers PT goals addressed  during session: Mobility/safety with mobility;Balance;Proper use of DME OT goals addressed during session: ADL's and self-care;Proper use of Adaptive equipment and DME      AM-PAC OT "6 Clicks" Daily Activity     Outcome Measure Help from another person eating meals?: A Little Help from another person taking care of personal grooming?: A Little Help from another person toileting, which  includes using toliet, bedpan, or urinal?: A Little Help from another person bathing (including washing, rinsing, drying)?: A Little Help from another person to put on and taking off regular upper body clothing?: A Little Help from another person to put on and taking off regular lower body clothing?: A Little 6 Click Score: 18   End of Session Equipment Utilized During Treatment: Gait belt;Rolling walker  Activity Tolerance: Patient tolerated treatment well Patient left: in chair;with call bell/phone within reach;with chair alarm set  OT Visit Diagnosis: Other abnormalities of gait and mobility (R26.89);Muscle weakness (generalized) (M62.81);Other symptoms and signs involving cognitive function                Time: 3570-1779 OT Time Calculation (min): 40 min Charges:  OT General Charges $OT Visit: 1 Visit OT Evaluation $OT Eval Moderate Complexity: 1 Mod OT Treatments $Self Care/Home Management : 8-22 mins  Shara Blazing, M.S., OTR/L Ascom: [phone] 04/16/20, 3:38 PM

## 2020-04-16 NOTE — Plan of Care (Signed)

## 2020-04-16 NOTE — Care Management Important Message (Signed)
Important Message  Patient Details  Name: LINDEL MARCELL MRN: [id number] Date of Birth: Nov 28, 1955   Medicare Important Message Given:  Yes     Dannette Barbara 04/16/2020, 10:08 AM

## 2020-04-16 NOTE — Progress Notes (Signed)
[place], [place] [address], [place], [place], [place], [address] [address], [place], [place], [place], [address] Phone: [phone]  Fax: [phone]   Subjective: Hemoglobin remains stable.  Patient is awake and alert today and is able to answer questions.  He denies any active bleeding at home or since being here.  No active bleeding noted or reported by staff and under flowsheets  Patient reports history of colonoscopy and upper endoscopy about 5 to 6 years ago with [facility].  He states it was normal.  We are unable to find these records as they are not available anywhere in his chart.   Objective: Exam: Vital signs in last 24 hours: Vitals:   04/15/20 0731 04/15/20 1305 04/15/20 1459 04/16/20 0101  BP: 117/82 114/73 123/81 122/82  Pulse: (!) 103 (!) 117 97 (!) 110  Resp: 18 18 16 18   Temp: 98.6 F (37 C) 98.4 F (36.9 C) 98.6 F (37 C) 98 F (36.7 C)  TempSrc:  Oral Oral Oral  SpO2: 96% 98% 97% 98%  Weight:      Height:       Weight change:   Intake/Output Summary (Last 24 hours) at 04/16/2020 1222 Last data filed at 04/15/2020 1900 Gross per 24 hour  Intake 240 ml  Output --  Net 240 ml    General: No acute distress, AAO x3 Abd: Soft, NT/ND, No HSM Skin: Warm, no rashes Neck: Supple, Trachea midline   Lab Results: Lab Results  Component Value Date   WBC 6.4 04/16/2020   HGB 8.5 (L) 04/16/2020   HCT 29.0 (L) 04/16/2020   MCV 77.7 (L) 04/16/2020   PLT 366 04/16/2020   Micro Results: Recent Results (from the past 240 hour(s))  Blood culture (routine single)     Status: None (Preliminary result)   Collection Time: 05/02/2020  3:58 AM   Specimen: BLOOD  Result Value Ref Range Status   Specimen Description BLOOD BLOOD RIGHT FOREARM  Final   Special Requests   Final    BOTTLES DRAWN AEROBIC AND ANAEROBIC Blood Culture results may not be optimal due to an excessive volume of blood received in culture bottles   Culture   Final     NO GROWTH 3 DAYS Performed at [facility], [address], [place], [place] [address]    Report Status PENDING  Incomplete  Respiratory Panel by RT PCR (Flu A&B, Covid) - Nasopharyngeal Swab     Status: None   Collection Time: 04/20/2020  3:58 AM   Specimen: Nasopharyngeal Swab  Result Value Ref Range Status   SARS Coronavirus 2 by RT PCR NEGATIVE NEGATIVE Final    Comment: (NOTE) SARS-CoV-2 target nucleic acids are NOT DETECTED.  The SARS-CoV-2 RNA is generally detectable in upper respiratoy specimens during the acute phase of infection. The lowest concentration of SARS-CoV-2 viral copies this assay can detect is 131 copies/mL. A negative result does not preclude SARS-Cov-2 infection and should not be used as the sole basis for treatment or other patient management decisions. A negative result may occur with  improper specimen collection/handling, submission of specimen other than nasopharyngeal swab, presence of viral mutation(s) within the areas targeted by this assay, and inadequate number of viral copies (<131 copies/mL). A negative result must be combined with clinical observations, patient history, and epidemiological information. The expected result is Negative.  Fact Sheet for Patients:  PinkCheek.be  Fact Sheet for Healthcare Providers:  GravelBags.it  This test is no t yet  approved or cleared by the [place] and  has been authorized for detection and/or diagnosis of SARS-CoV-2 by FDA under an Emergency Use Authorization (EUA). This EUA will remain  in effect (meaning this test can be used) for the duration of the COVID-19 declaration under Section 564(b)(1) of the Act, 21 U.S.C. section 360bbb-3(b)(1), unless the authorization is terminated or revoked sooner.     Influenza A by PCR NEGATIVE NEGATIVE Final   Influenza B by PCR NEGATIVE NEGATIVE Final    Comment: (NOTE) The Xpert Xpress  SARS-CoV-2/FLU/RSV assay is intended as an aid in  the diagnosis of influenza from Nasopharyngeal swab specimens and  should not be used as a sole basis for treatment. Nasal washings and  aspirates are unacceptable for Xpert Xpress SARS-CoV-2/FLU/RSV  testing.  Fact Sheet for Patients: PinkCheek.be  Fact Sheet for Healthcare Providers: GravelBags.it  This test is not yet approved or cleared by the [place] FDA and  has been authorized for detection and/or diagnosis of SARS-CoV-2 by  FDA under an Emergency Use Authorization (EUA). This EUA will remain  in effect (meaning this test can be used) for the duration of the  Covid-19 declaration under Section 564(b)(1) of the Act, 21  U.S.C. section 360bbb-3(b)(1), unless the authorization is  terminated or revoked. Performed at [facility], [address] [place], [place] [address]   Culture, blood (single)     Status: None (Preliminary result)   Collection Time: 04/06/2020  1:36 PM   Specimen: BLOOD  Result Value Ref Range Status   Specimen Description BLOOD LEFT [facility]  Final   Special Requests   Final    BOTTLES DRAWN AEROBIC ONLY Blood Culture adequate volume   Culture   Final    NO GROWTH 3 DAYS Performed at [facility], [address], [place], [place] [address]    Report Status PENDING  Incomplete  Urine culture     Status: None   Collection Time: 04/30/2020  3:10 PM   Specimen: Urine, Random  Result Value Ref Range Status   Specimen Description   Final    URINE, RANDOM Performed at [facility], [address] [place], [place] [address]    Special Requests   Final    NONE Performed at [facility], [address] [place], [place] [address]    Culture   Final    NO GROWTH Performed at [facility], [place] [address], [place], [place] [address]    Report Status 04/14/2020 FINAL  Final   Studies/Results: US Carotid  Bilateral  Result Date: 04/16/2020 CLINICAL DATA:  Stroke symptoms, altered mental status, diabetes EXAM: BILATERAL CAROTID DUPLEX ULTRASOUND TECHNIQUE: Pearline Cables scale imaging, color Doppler and duplex ultrasound were performed of bilateral carotid and vertebral arteries in the neck. COMPARISON:  None. FINDINGS: Criteria: Quantification of carotid stenosis is based on velocity parameters that correlate the residual internal carotid diameter with NASCET-based stenosis levels, using the diameter of the distal internal carotid lumen as the denominator for stenosis measurement. The following velocity measurements were obtained: RIGHT ICA: 101/20 cm/sec CCA: 66/06 cm/sec SYSTOLIC ICA/CCA RATIO:  1.1 ECA: 54 cm/sec LEFT ICA: 92/30 cm/sec CCA: 301/60 cm/sec SYSTOLIC ICA/CCA RATIO:  0.9 ECA: 64 cm/sec RIGHT CAROTID ARTERY: No significant carotid atherosclerosis. No hemodynamically significant ICA stenosis, velocity elevation, or turbulent flow. Negative for stenosis. Normal for age. RIGHT VERTEBRAL ARTERY:  Normal antegrade flow LEFT CAROTID ARTERY: Very minimal bifurcation intimal thickening and early atherosclerotic change. No  hemodynamically significant left ICA stenosis, velocity elevation, or turbulent flow. Degree of narrowing less than 50% by ultrasound criteria. LEFT VERTEBRAL ARTERY:  Normal antegrade flow IMPRESSION: No significant right ICA atherosclerosis. Negative for right ICA stenosis. Minor left carotid atherosclerosis. Degree of narrowing less than 50% by ultrasound criteria. Normal antegrade vertebral flow bilaterally Electronically Signed   By: Jerilynn Mages.  Shick M.D.   On: 04/16/2020 07:58   ECHOCARDIOGRAM COMPLETE BUBBLE STUDY  Result Date: 04/15/2020    ECHOCARDIOGRAM REPORT   Patient Name:   Hunter Davila Date of Exam: 04/15/2020 Medical Rec #:  [id number]            Height:       74.0 in Accession #:    [id number]           Weight:       201.0 lb Date of Birth:  12/03/1955            BSA:           2.178 m Patient Age:    20 years             BP:           141/94 mmHg Patient Gender: M                    HR:           104 bpm. Exam Location:  [facility] Procedure: 2D Echo, Cardiac Doppler, Color Doppler and Saline Contrast Bubble            Study Indications:     Stroke 434.91  History:         Patient has no prior history of Echocardiogram examinations.                  Risk Factors:Hypertension and Diabetes.  Sonographer:     Sherrie Sport RDCS (AE) Referring Phys:  [employer] Diagnosing Phys: Nelva Bush MD IMPRESSIONS  1. Left ventricular ejection fraction, by estimation, is 55 to 60%. The left ventricle has normal function. Left ventricular endocardial border not optimally defined to evaluate regional wall motion. There is mild left ventricular hypertrophy. Left ventricular diastolic parameters are consistent with Grade I diastolic dysfunction (impaired relaxation).  2. Right ventricular systolic function is normal. The right ventricular size is normal.  3. Left atrial size was mildly dilated.  4. The mitral valve is grossly normal. No evidence of mitral valve regurgitation.  5. The aortic valve is tricuspid. There is mild thickening of the aortic valve. Aortic valve regurgitation is not visualized. Mild aortic valve sclerosis is present, with no evidence of aortic valve stenosis.  6. Bubble study is nondiagnostic. A small right-to-left shunt cannot be excluded. FINDINGS  Left Ventricle: Left ventricular ejection fraction, by estimation, is 55 to 60%. The left ventricle has normal function. Left ventricular endocardial border not optimally defined to evaluate regional wall motion. The left ventricular internal cavity size was normal in size. There is mild left ventricular hypertrophy. Left ventricular diastolic parameters are consistent with Grade I diastolic dysfunction (impaired relaxation). Right Ventricle: The right ventricular size is normal. No increase in right ventricular wall thickness.  Right ventricular systolic function is normal. Left Atrium: Left atrial size was mildly dilated. Right Atrium: Right atrial size was normal in size. Pericardium: The pericardium was not well visualized. Mitral Valve: The mitral valve is grossly normal. No evidence of mitral valve regurgitation. Tricuspid Valve: The tricuspid valve is not well visualized.  Tricuspid valve regurgitation is not demonstrated. Aortic Valve: The aortic valve is tricuspid. There is mild thickening of the aortic valve. Aortic valve regurgitation is not visualized. Mild aortic valve sclerosis is present, with no evidence of aortic valve stenosis. Aortic valve mean gradient measures 3.0 mmHg. Aortic valve peak gradient measures 5.4 mmHg. Aortic valve area, by VTI measures 3.29 cm. Pulmonic Valve: The pulmonic valve was not well visualized. Pulmonic valve regurgitation is not visualized. No evidence of pulmonic stenosis. Aorta: The aortic root is normal in size and structure. Pulmonary Artery: The pulmonary artery is of normal size. IAS/Shunts: The interatrial septum was not well visualized. Agitated saline contrast was given intravenously to evaluate for intracardiac shunting. Bubble study is nondiagnostic. A small right-to-left shunt cannot be excluded.  LEFT VENTRICLE PLAX 2D LVIDd:         4.73 cm  Diastology LVIDs:         3.28 cm  LV e' medial:    7.29 cm/s LV PW:         1.04 cm  LV E/e' medial:  7.8 LV IVS:        1.32 cm  LV e' lateral:   7.51 cm/s LVOT diam:     2.30 cm  LV E/e' lateral: 7.6 LV SV:         58 LV SV Index:   27 LVOT Area:     4.15 cm  RIGHT VENTRICLE RV S prime:     13.10 cm/s TAPSE (M-mode): 1.6 cm LEFT ATRIUM             Index       RIGHT ATRIUM           Index LA diam:        3.90 cm 1.79 cm/m  RA Area:     10.20 cm LA Vol (A2C):   73.8 ml 33.88 ml/m RA Volume:   17.80 ml  8.17 ml/m LA Vol (A4C):   59.6 ml 27.36 ml/m LA Biplane Vol: 73.7 ml 33.84 ml/m  AORTIC VALVE                   PULMONIC VALVE AV Area  (Vmax):    2.86 cm    PV Vmax:        0.84 m/s AV Area (Vmean):   2.93 cm    PV Peak grad:   2.8 mmHg AV Area (VTI):     3.29 cm    RVOT Peak grad: 4 mmHg AV Vmax:           116.00 cm/s AV Vmean:          75.400 cm/s AV VTI:            0.177 m AV Peak Grad:      5.4 mmHg AV Mean Grad:      3.0 mmHg LVOT Vmax:         79.80 cm/s LVOT Vmean:        53.200 cm/s LVOT VTI:          0.140 m LVOT/AV VTI ratio: 0.79  AORTA Ao Root diam: 3.40 cm MITRAL VALVE MV Area (PHT): 3.60 cm    SHUNTS MV Decel Time: 211 msec    Systemic VTI:  0.14 m MV E velocity: 57.20 cm/s  Systemic Diam: 2.30 cm MV A velocity: 91.20 cm/s MV E/A ratio:  0.63 Harrell Gave End MD Electronically signed by Nelva Bush MD Signature Date/Time: 04/15/2020/11:04:48 AM    Final  Medications:  Scheduled Meds: . amLODipine  10 mg Oral Daily  . amoxicillin-clavulanate  1 tablet Oral Q12H  . baclofen  10 mg Oral TID  . feeding supplement (ENSURE ENLIVE)  237 mL Oral TID BM  .  HYDROmorphone (DILAUDID) injection  1 mg Intravenous Once  . insulin aspart  0-15 Units Subcutaneous TID WC  . levothyroxine  150 mcg Oral Daily  . oxyCODONE  20 mg Oral Q12H  . pantoprazole (PROTONIX) IV  40 mg Intravenous Q12H  . QUEtiapine  50 mg Oral QHS  . senna  2 tablet Oral BID  . tamsulosin  0.4 mg Oral Daily   Continuous Infusions: PRN Meds:.acetaminophen, haloperidol lactate, lactulose, oxyCODONE-acetaminophen   Assessment: Principal Problem:   Acute metabolic encephalopathy Active Problems:   Renal cancer (HCC)   Diabetes mellitus without complication (HCC)   Hypertension   GERD (gastroesophageal reflux disease)   Hypothyroidism   CAP (community acquired pneumonia)   Lactic acidosis   Anemia of chronic disease   Aspiration pneumonia (HCC)   Failure to thrive in adult   Iron deficiency anemia due to chronic blood loss   Severe protein-calorie malnutrition (HCC)   Severe back pain   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[place])    Plan: Patient's acute  confusion has now resolved  Given his acute on chronic anemia, I discussed endoscopic procedures with him, that is if neurology was to clear the patient for procedures  However, patient is not agreeable to procedures at this time, but states he will think about it and will let us know if he changes his mind  Again, if endoscopic procedures are necessary prior to anticoagulation for his stroke, we would need neurology clearance and if patient is agreeable, we can proceed accordingly, as long as anesthesia staff is agreeable too  Currently, neurology following and patient is undergoing work-up including echo, carotid Dopplers    LOS: 3 days   Hunter Antigua, MD 04/16/2020, 12:22 PM

## 2020-04-16 NOTE — Progress Notes (Addendum)
Pt c/o of chest tightness and has inspiratory wheezes mildly. Pt states not better with a cough, but patient assisted to chair to eat lunch and tolerated well. Will continue to monitor. MD paged

## 2020-04-16 NOTE — Progress Notes (Addendum)
Progress Note    Hunter Davila  PPJ:[id number] DOB: 26-Jul-1955  DOA: 04/16/2020 PCP: Patient, No Pcp Per      Brief Narrative:    Medical records reviewed and are as summarized below:  Hunter Davila is a 64 y.o. male with medical history significant for renal cell carcinoma on oral chemotherapy, hypothyroidism, diabetes mellitus, hypertension, GERD.  He was brought to the hospital because of right flank pain and altered mental status.  Reportedly, patient has had intermittent confusion and has been having worsening pain in the right flank and has also been having pain in the back.  He also reported difficulty walking, weakness in bilateral legs and had incontinence of urine.  He had also been dealing with insomnia.  MRI brain revealed acute to subacute left posterior frontal infarct.  There there are also 2 lesions suspicious for metastatic disease in the brain.  Neurologist was consulted and he recommended dual antiplatelet therapy with aspirin and Plavix for 21 days followed by aspirin monotherapy.  He was evaluated by oncologist for metastatic renal cell carcinoma.  There was some concern that chemotherapy was contributing to altered mental status.  He was previously on lenvatinib and axitinib but these have been discontinued because of their association with encephalopathy.  He was evaluated by the gastroenterologist for acute on chronic anemia.  He was transfused with 1 unit of packed red blood cells and was also given IV iron infusion.  However, there is no evidence of active GI bleeding.  Endoscopic therapy has been deferred because of acute stroke.    Assessment/Plan:   Principal Problem:   Acute metabolic encephalopathy Active Problems:   Renal cancer ([facility])   Diabetes mellitus without complication ([place])   Hypertension   GERD (gastroesophageal reflux disease)   Hypothyroidism   CAP (community acquired pneumonia)   Lactic acidosis   Anemia of chronic  disease   Aspiration pneumonia (H[facility])   Failure to thrive in adult   Iron deficiency anemia due to chronic blood loss   Severe protein-calorie malnutrition ([facility])   Severe back pain   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HCC)   Nutrition Problem: Increased nutrient needs Etiology: cancer and cancer related treatments (metastatic renal cancer on oral chemotherapy)  Signs/Symptoms: estimated needs   Body mass index is 25.81 kg/m.   Acute metabolic encephalopathy: This is thought to be multifactorial.  Improved.  He was started on Seroquel which seems to be helping.  Severe back pain: MRI lumbar and thoracic spine is pending.  Analgesics as needed for pain.  Left lower lobe aspiration pneumonia: Continue Augmentin to complete a 5-day course of treatment  Renal cell carcinoma with brain metastasis: Lenvatinib and axitinib have been discontinued because of encephalopathy.  Follow-up with oncologist as an outpatient.  Acute left posterior frontal ischemic stroke: Continue aspirin and Plavix for 21 days followed by aspirin monotherapy.  PT and OT evaluation.  Iron deficiency anemia/acute on chronic anemia: Status post transfusion with 1 unit of PRBCs and status post iron infusion.  No endoscopic work-up because of acute stroke.  Severe protein calorie malnutrition: Continue nutritional supplements.  Lactic acidosis: Improved.  Suspected to be due to Metformin or cancer.  Type II DM: Humalog as needed for hyperglycemia.   Diet Order            Diet Carb Modified Fluid consistency: Thin; Room service appropriate? Yes  Diet effective now  Consultants:  Neurologist, Dr. Doy Mince  Oncologist, Dr. Susy Manor  Gastroenterologist, Dr. Bonna Gains  Procedures:  None    Medications:   . amLODipine  10 mg Oral Daily  . amoxicillin-clavulanate  1 tablet Oral Q12H  . baclofen  10 mg Oral TID  . feeding supplement  237 mL Oral TID BM  .  HYDROmorphone (DILAUDID) injection  1 mg  Intravenous Once  . insulin aspart  0-15 Units Subcutaneous TID WC  . levothyroxine  150 mcg Oral Daily  . oxyCODONE  20 mg Oral Q12H  . pantoprazole (PROTONIX) IV  40 mg Intravenous Q12H  . QUEtiapine  50 mg Oral QHS  . senna  2 tablet Oral BID  . tamsulosin  0.4 mg Oral Daily   Continuous Infusions:   Anti-infectives (From admission, onward)   Start     Dose/Rate Route Frequency Ordered Stop   04/15/20 1100  amoxicillin-clavulanate (AUGMENTIN) 875-125 MG per tablet 1 tablet        1 tablet Oral Every 12 hours 04/15/20 1000 04/19/20 0959   04/15/20 1000  azithromycin (ZITHROMAX) tablet 500 mg  Status:  Discontinued        500 mg Oral Daily 04/15/20 0841 04/15/20 1000   04/14/20 1400  Ampicillin-Sulbactam (UNASYN) 3 g in sodium chloride 0.9 % 100 mL IVPB  Status:  Discontinued        3 g 200 mL/hr over 30 Minutes Intravenous Every 6 hours 04/14/20 1239 04/15/20 1000   04/08/2020 0900  cefTRIAXone (ROCEPHIN) 2 g in sodium chloride 0.9 % 100 mL IVPB  Status:  Discontinued        2 g 200 mL/hr over 30 Minutes Intravenous Every 24 hours 04/20/2020 0848 04/27/2020 0911   05/03/2020 0900  azithromycin (ZITHROMAX) 500 mg in sodium chloride 0.9 % 250 mL IVPB  Status:  Discontinued        500 mg 250 mL/hr over 60 Minutes Intravenous Every 24 hours 04/12/2020 0848 04/21/2020 0911   04/26/2020 0545  cefTRIAXone (ROCEPHIN) 2 g in sodium chloride 0.9 % 100 mL IVPB  Status:  Discontinued        2 g 200 mL/hr over 30 Minutes Intravenous Every 24 hours 05/02/2020 0542 04/14/20 1239   04/15/2020 0545  azithromycin (ZITHROMAX) 500 mg in sodium chloride 0.9 % 250 mL IVPB  Status:  Discontinued        500 mg 250 mL/hr over 60 Minutes Intravenous Every 24 hours 04/15/2020 0542 04/15/20 0841             Family Communication/Anticipated D/C date and plan/Code Status   DVT prophylaxis: Place and maintain sequential compression device Start: 04/15/20 1138     Code Status: DNR  Family Communication:  None Disposition Plan:    Status is: Inpatient  Remains inpatient appropriate because:Ongoing diagnostic testing needed not appropriate for outpatient work up and Unsafe d/c plan   Dispo: The patient is from: Home              Anticipated d/c is to: Home              Anticipated d/c date is: 1 day              Patient currently is not medically stable to d/c.           Subjective:   He complains of right flank pain and low back pain  Objective:    Vitals:   04/15/20 0731 04/15/20 1305 04/15/20 1459 04/16/20 0101  BP: 117/82 114/73 123/81 122/82  Pulse: (!) 103 (!) 117 97 (!) 110  Resp: 18 18 16 18   Temp: 98.6 F (37 C) 98.4 F (36.9 C) 98.6 F (37 C) 98 F (36.7 C)  TempSrc:  Oral Oral Oral  SpO2: 96% 98% 97% 98%  Weight:      Height:       No data found.   Intake/Output Summary (Last 24 hours) at 04/16/2020 1331 Last data filed at 04/15/2020 1900 Gross per 24 hour  Intake 240 ml  Output --  Net 240 ml   Filed Weights   04/05/2020 0345  Weight: 91.2 kg    Exam:  GEN: NAD SKIN: Multiple bruises on bilateral upper extremities EYES: EOMI ENT: MMM CV: RRR PULM: CTA B ABD: soft, ND, NT, +BS CNS: AAO x 3, non focal EXT: No edema or tenderness MSK: No spinal tenderness   Data Reviewed:   I have personally reviewed following labs and imaging studies:  Labs: Labs show the following:   Basic Metabolic Panel: Recent Labs  Lab 04/08/2020 0358 04/07/2020 0358 04/14/20 0150 04/16/20 0437  NA 135  --  141 140  K 4.5   < > 4.0 3.8  CL 101  --  106 105  CO2 20*  --  24 25  GLUCOSE 167*  --  108* 170*  BUN 12  --  10 10  CREATININE 0.99  --  0.99 1.24  CALCIUM 8.4*  --  8.4* 8.2*   < > = values in this interval not displayed.   GFR Estimated Creatinine Clearance: 71.8 mL/min (by C-G formula based on SCr of 1.24 mg/dL). Liver Function Tests: Recent Labs  Lab 04/25/2020 0358 04/14/20 0150  AST 26 24  ALT 10 9  ALKPHOS 80 69  BILITOT 0.3  0.3  PROT 7.9 7.2  ALBUMIN 2.7* 2.3*   Recent Labs  Lab 04/29/2020 0358  LIPASE 20   No results for input(s): AMMONIA in the last 168 hours. Coagulation profile Recent Labs  Lab 04/07/2020 0358  INR 1.0    CBC: Recent Labs  Lab 04/11/2020 0358 04/30/2020 0358 04/10/2020 1956 04/14/20 0150 04/15/20 0014 04/15/20 0318 04/16/20 0437  WBC 6.7   < > 5.0 4.6 4.7 4.6 6.4  NEUTROABS 5.3  --  3.8  --   --  3.1  --   HGB 7.9*   < > 6.9* 7.5* 8.0* 8.3* 8.5*  HCT 27.1*   < > 23.3* 25.4* 27.0* 27.9* 29.0*  MCV 76.1*   < > 74.9* 74.9* 76.7* 76.6* 77.7*  PLT 352   < > 323 349 352 355 366   < > = values in this interval not displayed.   Cardiac Enzymes: No results for input(s): CKTOTAL, CKMB, CKMBINDEX, TROPONINI in the last 168 hours. BNP (last 3 results) No results for input(s): PROBNP in the last 8760 hours. CBG: Recent Labs  Lab 04/15/20 1131 04/15/20 1745 04/15/20 2103 04/16/20 0916 04/16/20 1206  GLUCAP 187* 96 156* 97 131*   D-Dimer: No results for input(s): DDIMER in the last 72 hours. Hgb A1c: No results for input(s): HGBA1C in the last 72 hours. Lipid Profile: Recent Labs    04/16/20 0437  CHOL 170  HDL 47  LDLCALC 88  TRIG 175*  CHOLHDL 3.6   Thyroid function studies: Recent Labs    04/16/20 0437  TSH 7.730*   Anemia work up: Recent Labs    04/14/20 0416 04/14/20 1534  VITAMINB12  --  305  FERRITIN 188  --   TIBC 217*  --   IRON 33*  --    Sepsis Labs: Recent Labs  Lab 04/12/2020 0358 04/18/2020 0358 04/17/2020 0641 04/05/2020 1956 04/10/2020 1956 04/14/20 0150 04/15/20 0014 04/15/20 0318 04/16/20 0437  WBC 6.7   < >  --  5.0   < > 4.6 4.7 4.6 6.4  LATICACIDVEN 2.4*  --  2.5* 1.4  --   --   --   --   --    < > = values in this interval not displayed.    Microbiology Recent Results (from the past 240 hour(s))  Blood culture (routine single)     Status: None (Preliminary result)   Collection Time: 04/17/2020  3:58 AM   Specimen: BLOOD  Result  Value Ref Range Status   Specimen Description BLOOD BLOOD RIGHT FOREARM  Final   Special Requests   Final    BOTTLES DRAWN AEROBIC AND ANAEROBIC Blood Culture results may not be optimal due to an excessive volume of blood received in culture bottles   Culture   Final    NO GROWTH 3 DAYS Performed at [facility], [address], [place], [place] [address]    Report Status PENDING  Incomplete  Respiratory Panel by RT PCR (Flu A&B, Covid) - Nasopharyngeal Swab     Status: None   Collection Time: 04/08/2020  3:58 AM   Specimen: Nasopharyngeal Swab  Result Value Ref Range Status   SARS Coronavirus 2 by RT PCR NEGATIVE NEGATIVE Final    Comment: (NOTE) SARS-CoV-2 target nucleic acids are NOT DETECTED.  The SARS-CoV-2 RNA is generally detectable in upper respiratoy specimens during the acute phase of infection. The lowest concentration of SARS-CoV-2 viral copies this assay can detect is 131 copies/mL. A negative result does not preclude SARS-Cov-2 infection and should not be used as the sole basis for treatment or other patient management decisions. A negative result may occur with  improper specimen collection/handling, submission of specimen other than nasopharyngeal swab, presence of viral mutation(s) within the areas targeted by this assay, and inadequate number of viral copies (<131 copies/mL). A negative result must be combined with clinical observations, patient history, and epidemiological information. The expected result is Negative.  Fact Sheet for Patients:  PinkCheek.be  Fact Sheet for Healthcare Providers:  GravelBags.it  This test is no t yet approved or cleared by the [place] FDA and  has been authorized for detection and/or diagnosis of SARS-CoV-2 by FDA under an Emergency Use Authorization (EUA). This EUA will remain  in effect (meaning this test can be used) for the duration of the COVID-19  declaration under Section 564(b)(1) of the Act, 21 U.S.C. section 360bbb-3(b)(1), unless the authorization is terminated or revoked sooner.     Influenza A by PCR NEGATIVE NEGATIVE Final   Influenza B by PCR NEGATIVE NEGATIVE Final    Comment: (NOTE) The Xpert Xpress SARS-CoV-2/FLU/RSV assay is intended as an aid in  the diagnosis of influenza from Nasopharyngeal swab specimens and  should not be used as a sole basis for treatment. Nasal washings and  aspirates are unacceptable for Xpert Xpress SARS-CoV-2/FLU/RSV  testing.  Fact Sheet for Patients: PinkCheek.be  Fact Sheet for Healthcare Providers: GravelBags.it  This test is not yet approved or cleared by the [place] FDA and  has been authorized for detection and/or diagnosis of SARS-CoV-2 by  FDA under an Emergency Use Authorization (EUA). This EUA will remain  in effect (meaning  this test can be used) for the duration of the  Covid-19 declaration under Section 564(b)(1) of the Act, 21  U.S.C. section 360bbb-3(b)(1), unless the authorization is  terminated or revoked. Performed at [facility], [address] [place], [place] [address]   Culture, blood (single)     Status: None (Preliminary result)   Collection Time: 05/02/2020  1:36 PM   Specimen: BLOOD  Result Value Ref Range Status   Specimen Description BLOOD LEFT [facility]  Final   Special Requests   Final    BOTTLES DRAWN AEROBIC ONLY Blood Culture adequate volume   Culture   Final    NO GROWTH 3 DAYS Performed at [facility], [address] [place], [place] [address]    Report Status PENDING  Incomplete  Urine culture     Status: None   Collection Time: 05/03/2020  3:10 PM   Specimen: Urine, Random  Result Value Ref Range Status   Specimen Description   Final    URINE, RANDOM Performed at [facility], [address], [place], [place] [address]    Special Requests   Final     NONE Performed at [facility], [address], [place], [place] [address]    Culture   Final    NO GROWTH Performed at [facility], [place] [address] [place], [place] [address]    Report Status 04/14/2020 FINAL  Final    Procedures and diagnostic studies:  US Carotid Bilateral  Result Date: 04/16/2020 CLINICAL DATA:  Stroke symptoms, altered mental status, diabetes EXAM: BILATERAL CAROTID DUPLEX ULTRASOUND TECHNIQUE: Pearline Cables scale imaging, color Doppler and duplex ultrasound were performed of bilateral carotid and vertebral arteries in the neck. COMPARISON:  None. FINDINGS: Criteria: Quantification of carotid stenosis is based on velocity parameters that correlate the residual internal carotid diameter with NASCET-based stenosis levels, using the diameter of the distal internal carotid lumen as the denominator for stenosis measurement. The following velocity measurements were obtained: RIGHT ICA: 101/20 cm/sec CCA: 93/57 cm/sec SYSTOLIC ICA/CCA RATIO:  1.1 ECA: 54 cm/sec LEFT ICA: 92/30 cm/sec CCA: 017/79 cm/sec SYSTOLIC ICA/CCA RATIO:  0.9 ECA: 64 cm/sec RIGHT CAROTID ARTERY: No significant carotid atherosclerosis. No hemodynamically significant ICA stenosis, velocity elevation, or turbulent flow. Negative for stenosis. Normal for age. RIGHT VERTEBRAL ARTERY:  Normal antegrade flow LEFT CAROTID ARTERY: Very minimal bifurcation intimal thickening and early atherosclerotic change. No hemodynamically significant left ICA stenosis, velocity elevation, or turbulent flow. Degree of narrowing less than 50% by ultrasound criteria. LEFT VERTEBRAL ARTERY:  Normal antegrade flow IMPRESSION: No significant right ICA atherosclerosis. Negative for right ICA stenosis. Minor left carotid atherosclerosis. Degree of narrowing less than 50% by ultrasound criteria. Normal antegrade vertebral flow bilaterally Electronically Signed   By: Jerilynn Mages.  Shick M.D.   On: 04/16/2020 07:58   ECHOCARDIOGRAM COMPLETE  BUBBLE STUDY  Result Date: 04/15/2020    ECHOCARDIOGRAM REPORT   Patient Name:   SOLMON BOHR Date of Exam: 04/15/2020 Medical Rec #:  [id number]            Height:       74.0 in Accession #:    [id number]           Weight:       201.0 lb Date of Birth:  July 20, 1955            BSA:          2.178 m Patient Age:    6 years  BP:           141/94 mmHg Patient Gender: M                    HR:           104 bpm. Exam Location:  [facility] Procedure: 2D Echo, Cardiac Doppler, Color Doppler and Saline Contrast Bubble            Study Indications:     Stroke 434.91  History:         Patient has no prior history of Echocardiogram examinations.                  Risk Factors:Hypertension and Diabetes.  Sonographer:     Sherrie Sport RDCS (AE) Referring Phys:  [place] Diagnosing Phys: Nelva Bush MD IMPRESSIONS  1. Left ventricular ejection fraction, by estimation, is 55 to 60%. The left ventricle has normal function. Left ventricular endocardial border not optimally defined to evaluate regional wall motion. There is mild left ventricular hypertrophy. Left ventricular diastolic parameters are consistent with Grade I diastolic dysfunction (impaired relaxation).  2. Right ventricular systolic function is normal. The right ventricular size is normal.  3. Left atrial size was mildly dilated.  4. The mitral valve is grossly normal. No evidence of mitral valve regurgitation.  5. The aortic valve is tricuspid. There is mild thickening of the aortic valve. Aortic valve regurgitation is not visualized. Mild aortic valve sclerosis is present, with no evidence of aortic valve stenosis.  6. Bubble study is nondiagnostic. A small right-to-left shunt cannot be excluded. FINDINGS  Left Ventricle: Left ventricular ejection fraction, by estimation, is 55 to 60%. The left ventricle has normal function. Left ventricular endocardial border not optimally defined to evaluate regional wall motion. The left ventricular  internal cavity size was normal in size. There is mild left ventricular hypertrophy. Left ventricular diastolic parameters are consistent with Grade I diastolic dysfunction (impaired relaxation). Right Ventricle: The right ventricular size is normal. No increase in right ventricular wall thickness. Right ventricular systolic function is normal. Left Atrium: Left atrial size was mildly dilated. Right Atrium: Right atrial size was normal in size. Pericardium: The pericardium was not well visualized. Mitral Valve: The mitral valve is grossly normal. No evidence of mitral valve regurgitation. Tricuspid Valve: The tricuspid valve is not well visualized. Tricuspid valve regurgitation is not demonstrated. Aortic Valve: The aortic valve is tricuspid. There is mild thickening of the aortic valve. Aortic valve regurgitation is not visualized. Mild aortic valve sclerosis is present, with no evidence of aortic valve stenosis. Aortic valve mean gradient measures 3.0 mmHg. Aortic valve peak gradient measures 5.4 mmHg. Aortic valve area, by VTI measures 3.29 cm. Pulmonic Valve: The pulmonic valve was not well visualized. Pulmonic valve regurgitation is not visualized. No evidence of pulmonic stenosis. Aorta: The aortic root is normal in size and structure. Pulmonary Artery: The pulmonary artery is of normal size. IAS/Shunts: The interatrial septum was not well visualized. Agitated saline contrast was given intravenously to evaluate for intracardiac shunting. Bubble study is nondiagnostic. A small right-to-left shunt cannot be excluded.  LEFT VENTRICLE PLAX 2D LVIDd:         4.73 cm  Diastology LVIDs:         3.28 cm  LV e' medial:    7.29 cm/s LV PW:         1.04 cm  LV E/e' medial:  7.8 LV IVS:  1.32 cm  LV e' lateral:   7.51 cm/s LVOT diam:     2.30 cm  LV E/e' lateral: 7.6 LV SV:         58 LV SV Index:   27 LVOT Area:     4.15 cm  RIGHT VENTRICLE RV S prime:     13.10 cm/s TAPSE (M-mode): 1.6 cm LEFT ATRIUM              Index       RIGHT ATRIUM           Index LA diam:        3.90 cm 1.79 cm/m  RA Area:     10.20 cm LA Vol (A2C):   73.8 ml 33.88 ml/m RA Volume:   17.80 ml  8.17 ml/m LA Vol (A4C):   59.6 ml 27.36 ml/m LA Biplane Vol: 73.7 ml 33.84 ml/m  AORTIC VALVE                   PULMONIC VALVE AV Area (Vmax):    2.86 cm    PV Vmax:        0.84 m/s AV Area (Vmean):   2.93 cm    PV Peak grad:   2.8 mmHg AV Area (VTI):     3.29 cm    RVOT Peak grad: 4 mmHg AV Vmax:           116.00 cm/s AV Vmean:          75.400 cm/s AV VTI:            0.177 m AV Peak Grad:      5.4 mmHg AV Mean Grad:      3.0 mmHg LVOT Vmax:         79.80 cm/s LVOT Vmean:        53.200 cm/s LVOT VTI:          0.140 m LVOT/AV VTI ratio: 0.79  AORTA Ao Root diam: 3.40 cm MITRAL VALVE MV Area (PHT): 3.60 cm    SHUNTS MV Decel Time: 211 msec    Systemic VTI:  0.14 m MV E velocity: 57.20 cm/s  Systemic Diam: 2.30 cm MV A velocity: 91.20 cm/s MV E/A ratio:  0.63 Christopher End MD Electronically signed by Nelva Bush MD Signature Date/Time: 04/15/2020/11:04:48 AM    Final                LOS: 3 days   Nyheim Seufert  [facility]   Pager on [email or web address]. If 7PM-7AM, please contact night-coverage at [email or web address]     04/16/2020, 1:31 PM

## 2020-04-17 ENCOUNTER — Inpatient Hospital Stay: Payer: Medicare Other

## 2020-04-17 ENCOUNTER — Encounter: Admission: EM | Disposition: E | Payer: Self-pay | Source: Home / Self Care | Attending: Internal Medicine

## 2020-04-17 ENCOUNTER — Other Ambulatory Visit (INDEPENDENT_AMBULATORY_CARE_PROVIDER_SITE_OTHER): Payer: Self-pay | Admitting: Vascular Surgery

## 2020-04-17 DIAGNOSIS — J189 Pneumonia, unspecified organism: Secondary | ICD-10-CM | POA: Diagnosis not present

## 2020-04-17 DIAGNOSIS — E872 Acidosis: Secondary | ICD-10-CM | POA: Diagnosis not present

## 2020-04-17 DIAGNOSIS — I639 Cerebral infarction, unspecified: Secondary | ICD-10-CM

## 2020-04-17 DIAGNOSIS — C641 Malignant neoplasm of right kidney, except renal pelvis: Secondary | ICD-10-CM | POA: Diagnosis not present

## 2020-04-17 DIAGNOSIS — I6381 Other cerebral infarction due to occlusion or stenosis of small artery: Secondary | ICD-10-CM | POA: Diagnosis not present

## 2020-04-17 DIAGNOSIS — G9341 Metabolic encephalopathy: Secondary | ICD-10-CM | POA: Diagnosis not present

## 2020-04-17 DIAGNOSIS — M79604 Pain in right leg: Secondary | ICD-10-CM

## 2020-04-17 DIAGNOSIS — M79605 Pain in left leg: Secondary | ICD-10-CM

## 2020-04-17 DIAGNOSIS — I998 Other disorder of circulatory system: Secondary | ICD-10-CM

## 2020-04-17 HISTORY — PX: LOWER EXTREMITY ANGIOGRAPHY: CATH118251

## 2020-04-17 LAB — CBC WITH DIFFERENTIAL/PLATELET
Abs Immature Granulocytes: 0.06 10*3/uL (ref 0.00–0.07)
Basophils Absolute: 0 10*3/uL (ref 0.0–0.1)
Basophils Relative: 1 %
Eosinophils Absolute: 0.2 10*3/uL (ref 0.0–0.5)
Eosinophils Relative: 3 %
HCT: 27.9 % — ABNORMAL LOW (ref 39.0–52.0)
Hemoglobin: 8.3 g/dL — ABNORMAL LOW (ref 13.0–17.0)
Immature Granulocytes: 1 %
Lymphocytes Relative: 8 %
Lymphs Abs: 0.7 10*3/uL (ref 0.7–4.0)
MCH: 23 pg — ABNORMAL LOW (ref 26.0–34.0)
MCHC: 29.7 g/dL — ABNORMAL LOW (ref 30.0–36.0)
MCV: 77.3 fL — ABNORMAL LOW (ref 80.0–100.0)
Monocytes Absolute: 0.7 10*3/uL (ref 0.1–1.0)
Monocytes Relative: 8 %
Neutro Abs: 7.2 10*3/uL (ref 1.7–7.7)
Neutrophils Relative %: 79 %
Platelets: 402 10*3/uL — ABNORMAL HIGH (ref 150–400)
RBC: 3.61 MIL/uL — ABNORMAL LOW (ref 4.22–5.81)
RDW: 19.3 % — ABNORMAL HIGH (ref 11.5–15.5)
WBC: 8.9 10*3/uL (ref 4.0–10.5)
nRBC: 0 % (ref 0.0–0.2)

## 2020-04-17 LAB — PROTIME-INR
INR: 1 (ref 0.8–1.2)
Prothrombin Time: 13.1 seconds (ref 11.4–15.2)

## 2020-04-17 LAB — GLUCOSE, CAPILLARY
Glucose-Capillary: 171 mg/dL — ABNORMAL HIGH (ref 70–99)
Glucose-Capillary: 170 mg/dL — ABNORMAL HIGH (ref 70–99)
Glucose-Capillary: 183 mg/dL — ABNORMAL HIGH (ref 70–99)
Glucose-Capillary: 164 mg/dL — ABNORMAL HIGH (ref 70–99)

## 2020-04-17 LAB — APTT: aPTT: 32 seconds (ref 24–36)

## 2020-04-17 LAB — LACTIC ACID, PLASMA: Lactic Acid, Venous: 3.1 mmol/L (ref 0.5–1.9)

## 2020-04-17 LAB — PROCALCITONIN: Procalcitonin: 3.14 ng/mL

## 2020-04-17 SURGERY — LOWER EXTREMITY ANGIOGRAPHY
Anesthesia: Moderate Sedation | Laterality: Left

## 2020-04-17 MED ORDER — CEFAZOLIN SODIUM-DEXTROSE 2-4 GM/100ML-% IV SOLN
2.0000 g | Freq: Once | INTRAVENOUS | Status: AC
  Administered 2020-04-17: 2 g via INTRAVENOUS

## 2020-04-17 MED ORDER — ASPIRIN EC 81 MG PO TBEC
81.0000 mg | DELAYED_RELEASE_TABLET | Freq: Every day | ORAL | Status: DC
  Administered 2020-04-17: 81 mg via ORAL
  Filled 2020-04-17: qty 1

## 2020-04-17 MED ORDER — VANCOMYCIN HCL IN DEXTROSE 1-5 GM/200ML-% IV SOLN
1000.0000 mg | Freq: Two times a day (BID) | INTRAVENOUS | Status: DC
  Administered 2020-04-18: 1000 mg via INTRAVENOUS
  Filled 2020-04-17 (×2): qty 200

## 2020-04-17 MED ORDER — ONDANSETRON HCL 4 MG/2ML IJ SOLN: 4.0000 mg | Freq: Four times a day (QID) | INTRAMUSCULAR | Status: DC | PRN

## 2020-04-17 MED ORDER — VANCOMYCIN HCL 2000 MG/400ML IV SOLN
2000.0000 mg | Freq: Once | INTRAVENOUS | Status: AC
  Administered 2020-04-17: 2000 mg via INTRAVENOUS
  Filled 2020-04-17: qty 400

## 2020-04-17 MED ORDER — MIDAZOLAM HCL 5 MG/5ML IJ SOLN
INTRAMUSCULAR | Status: AC
  Filled 2020-04-17: qty 5

## 2020-04-17 MED ORDER — HYDROMORPHONE HCL 1 MG/ML IJ SOLN
1.0000 mg | Freq: Once | INTRAMUSCULAR | Status: AC | PRN
  Administered 2020-04-21: 1 mg via INTRAVENOUS
  Filled 2020-04-17: qty 1

## 2020-04-17 MED ORDER — HYDROMORPHONE HCL 1 MG/ML IJ SOLN
0.5000 mg | INTRAMUSCULAR | Status: DC | PRN
  Administered 2020-04-17: 0.5 mg via INTRAVENOUS
  Filled 2020-04-17: qty 1

## 2020-04-17 MED ORDER — FENTANYL CITRATE (PF) 100 MCG/2ML IJ SOLN
INTRAMUSCULAR | Status: DC | PRN
Start: 2020-04-17 — End: 2020-04-17
  Administered 2020-04-17 (×2): 25 ug via INTRAVENOUS

## 2020-04-17 MED ORDER — SODIUM CHLORIDE 0.9 % IV BOLUS
1000.0000 mL | Freq: Once | INTRAVENOUS | Status: AC
  Administered 2020-04-17: 1000 mL via INTRAVENOUS

## 2020-04-17 MED ORDER — SODIUM CHLORIDE 0.9 % IV SOLN
2.0000 g | Freq: Three times a day (TID) | INTRAVENOUS | Status: DC
  Administered 2020-04-17 – 2020-04-18 (×2): 2 g via INTRAVENOUS
  Filled 2020-04-17 (×4): qty 2

## 2020-04-17 MED ORDER — HEPARIN SODIUM (PORCINE) 1000 UNIT/ML IJ SOLN
INTRAMUSCULAR | Status: AC
  Filled 2020-04-17: qty 1

## 2020-04-17 MED ORDER — VANCOMYCIN HCL 1250 MG/250ML IV SOLN
1250.0000 mg | Freq: Two times a day (BID) | INTRAVENOUS | Status: DC
  Filled 2020-04-17: qty 250

## 2020-04-17 MED ORDER — FENTANYL CITRATE (PF) 100 MCG/2ML IJ SOLN
INTRAMUSCULAR | Status: AC
  Filled 2020-04-17: qty 2

## 2020-04-17 MED ORDER — IODIXANOL 320 MG/ML IV SOLN
INTRAVENOUS | Status: DC | PRN
  Administered 2020-04-17: 50 mL

## 2020-04-17 MED ORDER — SODIUM CHLORIDE 0.9 % IV SOLN: INTRAVENOUS | Status: DC

## 2020-04-17 MED ORDER — METHYLPREDNISOLONE SODIUM SUCC 125 MG IJ SOLR: 125.0000 mg | Freq: Once | INTRAMUSCULAR | Status: DC | PRN

## 2020-04-17 MED ORDER — HEPARIN BOLUS VIA INFUSION
4500.0000 [IU] | Freq: Once | INTRAVENOUS | Status: DC
  Filled 2020-04-17: qty 4500

## 2020-04-17 MED ORDER — DIPHENHYDRAMINE HCL 50 MG/ML IJ SOLN: 50.0000 mg | Freq: Once | INTRAMUSCULAR | Status: DC | PRN

## 2020-04-17 MED ORDER — FAMOTIDINE 20 MG PO TABS: 40.0000 mg | ORAL_TABLET | Freq: Once | ORAL | Status: DC | PRN

## 2020-04-17 MED ORDER — HEPARIN (PORCINE) 25000 UT/250ML-% IV SOLN: 1400.0000 [IU]/h | INTRAVENOUS | Status: DC

## 2020-04-17 MED ORDER — MIDAZOLAM HCL 2 MG/2ML IJ SOLN
INTRAMUSCULAR | Status: DC | PRN
  Administered 2020-04-17 (×2): 1 mg via INTRAVENOUS

## 2020-04-17 MED ORDER — CLINDAMYCIN PHOSPHATE 300 MG/50ML IV SOLN
INTRAVENOUS | Status: AC
  Filled 2020-04-17: qty 50

## 2020-04-17 MED ORDER — MIDAZOLAM HCL 2 MG/ML PO SYRP: 8.0000 mg | ORAL_SOLUTION | Freq: Once | ORAL | Status: DC | PRN

## 2020-04-17 SURGICAL SUPPLY — 9 items
CATH ANGIO 5F PIGTAIL 65CM (CATHETERS) ×3 IMPLANT
DEVICE STARCLOSE SE CLOSURE (Vascular Products) ×3 IMPLANT
GLIDECATH ANGLED 4FR 120CM (CATHETERS) ×3 IMPLANT
PACK ANGIOGRAPHY (CUSTOM PROCEDURE TRAY) ×3 IMPLANT
SHEATH BRITE TIP 5FRX11 (SHEATH) ×3 IMPLANT
SYR MEDRAD MARK 7 150ML (SYRINGE) ×3 IMPLANT
TUBING CONTRAST HIGH PRESS 72 (TUBING) ×6 IMPLANT
WIRE J 3MM .035X145CM (WIRE) ×3 IMPLANT
WIRE MAGIC TORQUE 315CM (WIRE) ×3 IMPLANT

## 2020-04-17 NOTE — Progress Notes (Signed)
Pt is awake but delayed responses, pt r pedal pulse is absent at this time even with doppler L pedal pulse is 1. BL LE are +2 pitting edema and cold to touch. Bladder scanned patient due to lack of voiding pt has 391 paged all this to md. Will continue to monitor.

## 2020-04-17 NOTE — Consult Note (Signed)
[facility] Vascular Consult Note  MRN : [id number]  Hunter Davila is a 64 y.o. ([birth year]) male who presents with chief complaint of  Chief Complaint  Patient presents with   Emesis   Altered Mental Status   History of Present Illness:  Of note: patient not responsive during consult.  Information for this consult was obtained through previous epic notation in clinical team.  Hunter Holter Whittemoreis a 64 y.o.malewith medical history significant forrenal cell carcinoma on oral chemotherapy, hypothyroidism, diabetes mellitus, hypertension and GERD who was brought into the ER by EMS for evaluation of mental status changes.  Per patient friend, his the POA, patient has not been sleeping at night for a while.  He has severe right flank pain, he was taking pain medicine, but is not working.  The pain is worse at night, he frequently get up at night taking shower to try to reduce the pain.  Recently, he was seen by palliative care, he was started on long-acting pain medicine as well as as needed pain medicine.  Still cannot sleep well.  His mental status has been gradually getting worse over the last few months.  He is easily confused, but sometimes his mind is clear. Patient also had a severe back pain initially on the right side.  Recently the pain has spread to the midline and the left side.  Patient also had incontinent of urine, weakness in bilateral legs.  He has difficulty with walking. Patient also had a significant constipation, he has been nauseated and vomiting for the last 2 or 3 months.  Before admission, he had an episode of vomiting.  Chest x-ray showed left lower lobe pneumonia.  MRI of the brain showed a 2 suspicious metastasis in the brain, small infarct.   Change in recent physical exam was notable for nonpalpable pedal pulses and cold feet.  Vascular surgery was emergently consulted by Dr. Mal Misty for possible intervention.  Current  Facility-Administered Medications  Medication Dose Route Frequency Provider Last Rate Last Admin   0.9 %  sodium chloride infusion   Intravenous Continuous Jaylenne Hamelin A, PA-C       0.9 %  sodium chloride infusion   Intravenous Continuous Tytionna Cloyd A, PA-C       [MAR Hold] acetaminophen (TYLENOL) tablet 500 mg  500 mg Oral Q8H PRN Agbata, Tochukwu, MD       [MAR Hold] amLODipine (NORVASC) tablet 10 mg  10 mg Oral Daily Agbata, Tochukwu, MD   10 mg at 04/16/2020 0856   [MAR Hold] amoxicillin-clavulanate (AUGMENTIN) 875-125 MG per tablet 1 tablet  1 tablet Oral Q12H Sharen Hones, MD   1 tablet at 04/16/2020 0856   [MAR Hold] aspirin EC tablet 81 mg  81 mg Oral Daily Jennye Boroughs, MD   81 mg at 04/08/2020 0855   [MAR Hold] baclofen (LIORESAL) tablet 10 mg  10 mg Oral TID Collier Bullock, MD   10 mg at 04/19/2020 0856   [START ON 04/18/2020] ceFAZolin (ANCEF) IVPB 2g/100 mL premix  2 g Intravenous Once Carlisa Eble A, PA-C       clindamycin (CLEOCIN) 300 MG/50ML IVPB            diphenhydrAMINE (BENADRYL) injection 50 mg  50 mg Intravenous Once PRN Jontavia Leatherbury A, PA-C       famotidine (PEPCID) tablet 40 mg  40 mg Oral Once PRN Velinda Wrobel, Janalyn Harder, PA-C       [MAR Hold] feeding supplement (ENSURE ENLIVE) (  ENSURE ENLIVE) liquid 237 mL  237 mL Oral TID BM Sharen Hones, MD   237 mL at 04/21/2020 0854   [facility] Hold] haloperidol lactate (HALDOL) injection 5 mg  5 mg Intravenous Q6H PRN Sharion Settler, NP   5 mg at 04/14/20 0912   [MAR Hold] heparin bolus via infusion 4,500 Units  4,500 Units Intravenous Once Shanlever, Pierce Crane, RPH       Followed by   [facility] Hold] heparin ADULT infusion 100 units/mL (25000 units/23mL sodium chloride 0.45%)  1,400 Units/hr Intravenous Continuous Lu Duffel, [facility]       [facility] Hold] HYDROmorphone (DILAUDID) injection 0.5 mg  0.5 mg Intravenous Q4H PRN Jennye Boroughs, MD   0.5 mg at 04/18/2020 1033   [MAR Hold] HYDROmorphone  (DILAUDID) injection 1 mg  1 mg Intravenous Once PRN Steen Bisig, Janalyn Harder, PA-C       [MAR Hold] insulin aspart (novoLOG) injection 0-15 Units  0-15 Units Subcutaneous TID WC Agbata, Tochukwu, MD   3 Units at 04/29/2020 0854   [MAR Hold] lactulose (CHRONULAC) 10 GM/15ML solution 20 g  20 g Oral Daily PRN Agbata, Royce Macadamia, MD       [MAR Hold] levothyroxine (SYNTHROID) tablet 150 mcg  150 mcg Oral Daily Agbata, Tochukwu, MD   150 mcg at 04/10/2020 0520   methylPREDNISolone sodium succinate (SOLU-MEDROL) 125 mg/2 mL injection 125 mg  125 mg Intravenous Once PRN Yovani Cogburn A, PA-C       midazolam (VERSED) 2 MG/ML syrup 8 mg  8 mg Oral Once PRN Chiyeko Ferre, Janalyn Harder, PA-C       [MAR Hold] ondansetron (ZOFRAN) injection 4 mg  4 mg Intravenous Q6H PRN Tommaso Cavitt A, PA-C       [MAR Hold] oxyCODONE (OXYCONTIN) 12 hr tablet 20 mg  20 mg Oral Q12H Sharen Hones, MD   20 mg at 04/20/2020 0856   [MAR Hold] oxyCODONE-acetaminophen (PERCOCET/ROXICET) 5-325 MG per tablet 1 tablet  1 tablet Oral Q4H PRN Sharen Hones, MD   1 tablet at 04/16/20 1229   [MAR Hold] pantoprazole (PROTONIX) injection 40 mg  40 mg Intravenous Q12H Sharion Settler, NP   40 mg at 04/20/2020 0855   [MAR Hold] QUEtiapine (SEROQUEL) tablet 50 mg  50 mg Oral QHS Sharen Hones, MD   50 mg at 04/16/20 2118   [MAR Hold] senna (SENOKOT) tablet 17.2 mg  2 tablet Oral BID Sharen Hones, MD   17.2 mg at 04/11/2020 0856   [MAR Hold] tamsulosin (FLOMAX) capsule 0.4 mg  0.4 mg Oral Daily Agbata, Tochukwu, MD   0.4 mg at 05/02/2020 4098   Past Medical History:  Diagnosis Date   Cancer ([place])    Diabetes mellitus without complication ([place])    Hypertension    Renal disorder    Renal cancer   History reviewed. No pertinent surgical history.  Social History Social History   Tobacco Use   Smoking status: Never Smoker   Smokeless tobacco: Never Used  Substance Use Topics   Alcohol use: Not Currently   Drug use: Not on file    Family History Family History  Problem Relation Age of Onset   Diabetes Mellitus II Mother    Prostate cancer Father   Unable to gather due to patient's mental status  No Known Allergies   REVIEW OF SYSTEMS (Negative unless checked)  Unable to gather based on patient's mental status change  Constitutional: [] Weight loss  [] Fever  [] Chills Cardiac: [] Chest pain   [] Chest pressure   []   Palpitations   [] Shortness of breath when laying flat   [] Shortness of breath at rest   [] Shortness of breath with exertion. Vascular:  [] Pain in legs with walking   [] Pain in legs at rest   [] Pain in legs when laying flat   [] Claudication   [] Pain in feet when walking  [] Pain in feet at rest  [] Pain in feet when laying flat   [] History of DVT   [] Phlebitis   [] Swelling in legs   [] Varicose veins   [] Non-healing ulcers Pulmonary:   [] Uses home oxygen   [] Productive cough   [] Hemoptysis   [] Wheeze  [] COPD   [] Asthma Neurologic:  [] Dizziness  [] Blackouts   [] Seizures   [] History of stroke   [] History of TIA  [] Aphasia   [] Temporary blindness   [] Dysphagia   [] Weakness or numbness in arms   [] Weakness or numbness in legs Musculoskeletal:  [] Arthritis   [] Joint swelling   [] Joint pain   [] Low back pain Hematologic:  [] Easy bruising  [] Easy bleeding   [] Hypercoagulable state   [] Anemic  [] Hepatitis Gastrointestinal:  [] Blood in stool   [] Vomiting blood  [] Gastroesophageal reflux/heartburn   [] Difficulty swallowing. Genitourinary:  [] Chronic kidney disease   [] Difficult urination  [] Frequent urination  [] Burning with urination   [] Blood in urine Skin:  [] Rashes   [] Ulcers   [] Wounds Psychological:  [] History of anxiety   []  History of major depression.  Physical Examination  Vitals:   04/04/2020 0805 04/04/2020 0849 04/10/2020 1017 04/16/2020 1053  BP: 102/73 117/67 90/63 113/86  Pulse: (!) 128 (!) 130 (!) 133 (!) 125  Resp: 16 16 20  (!) 22  Temp: 98.4 F (36.9 C) 98.5 F (36.9 C) (!) 96.8 F (36 C) 98.1 F  (36.7 C)  TempSrc: Oral  Axillary Oral  SpO2: 97% 100% 100% 100%  Weight:      Height:       Body mass index is 25.81 kg/m. Gen: Patient is nonresponsive.  Gazes towards the corner of the room, NAD Head: /AT, No temporalis wasting. Prominent temp pulse not noted. Ear/Nose/Throat: nares w/o erythema or drainage, oropharynx w/o Erythema/Exudate Eyes: Sclera non-icteric, conjunctiva clear Neck: Trachea midline.  No JVD.  Pulmonary:  Good air movement, respirations not labored, equal bilaterally.  Cardiac: RRR, normal S1, S2. Vascular:  Vessel Right Left  Radial Palpable Palpable  Ulnar Palpable Palpable  Brachial Palpable Palpable  Carotid Palpable, without bruit Palpable, without bruit  Aorta Not palpable N/A  Femoral Palpable Palpable  Popliteal Palpable Palpable  PT Non-Palpable Non-Palpable  DP Non-Palpable Non-Palpable   Right lower extremity: Thigh soft.  Calf soft.  Extremity is warm to about mid calf then transition to be cooler.  Mottled appearance to foot.  Unable to palpate pedal pulses.  Left lower extremity: Thigh soft.  Calf soft.  Extremity is warm to about mid calf then transition to be cooler.  Mottled appearance to foot.  Unable to palpate pedal pulses.  Gastrointestinal: soft, non-tender/non-distended. No guarding/reflex.  Musculoskeletal: M/S 5/5 throughout.  Extremities without ischemic changes.  No deformity or atrophy. No edema. Neurologic: Sensation grossly intact in extremities.  Symmetrical.  Speech is fluent. Motor exam as listed above. Psychiatric: Judgment intact, Mood & affect appropriate for pt's clinical situation. Dermatologic: No rashes or ulcers noted.  No cellulitis or open wounds. Lymph : No Cervical, Axillary, or Inguinal lymphadenopathy.  CBC Lab Results  Component Value Date   WBC 8.9 05/02/2020   HGB 8.3 (L) 04/20/2020   HCT 27.9 (L) 04/27/2020  MCV 77.3 (L) 04/10/2020   PLT 402 (H) 04/10/2020   BMET    Component Value  Date/Time   NA 140 04/16/2020 0437   K 3.8 04/16/2020 0437   CL 105 04/16/2020 0437   CO2 25 04/16/2020 0437   GLUCOSE 170 (H) 04/16/2020 0437   BUN 10 04/16/2020 0437   CREATININE 1.24 04/16/2020 0437   CALCIUM 8.2 (L) 04/16/2020 0437   GFRNONAA >60 04/16/2020 0437   GFRAA >60 03/20/2020 0916   Estimated Creatinine Clearance: 71.8 mL/min (by C-G formula based on SCr of 1.24 mg/dL).  COAG Lab Results  Component Value Date   INR 1.0 04/06/2020   Radiology CT Head Wo Contrast  Result Date: 04/23/2020 CLINICAL DATA:  Mental status change. EXAM: CT HEAD WITHOUT CONTRAST TECHNIQUE: Contiguous axial images were obtained from the base of the skull through the vertex without intravenous contrast. COMPARISON:  08/27/2019 FINDINGS: Brain: Age advanced cerebral atrophy. Mild ventriculomegaly is similar, favored to be secondary to atrophy. Mild low density in the periventricular white matter likely related to small vessel disease. there is also age advanced cerebellar atrophy. No mass lesion, hemorrhage, acute infarct, intra-axial, or extra-axial fluid collection. Vascular: Intracranial atherosclerosis. Skull: Normal skull. Sinuses/Orbits: Normal imaged portions of the orbits and globes. Clear paranasal sinuses and mastoid air cells. Other: None. IMPRESSION: 1. No acute intracranial abnormality. 2. Cerebral and cerebellar atrophy. Similar mild ventriculomegaly, felt to be secondary to cerebral atrophy. 3. Mild small vessel ischemic change. Electronically Signed   By: Abigail Miyamoto M.D.   On: 04/09/2020 05:57   CT Head Wo Contrast  Result Date: 03/18/2020 CLINICAL DATA:  Mental status change.  Renal cell carcinoma EXAM: CT HEAD WITHOUT CONTRAST TECHNIQUE: Contiguous axial images were obtained from the base of the skull through the vertex without intravenous contrast. COMPARISON:  MRI head 08/27/2019 in CT head 08/27/2019 FINDINGS: Brain: Generalized atrophy with mild ventricular enlargement appear  stable. Negative for acute infarct 5 mm subtle hyperdensity in the left temporal lobe lateral to the atria. This is unchanged from prior studies and likely is an area of chronic hemorrhage. This was also identified on MRI. No area of acute hemorrhage or mass. Vascular: Negative for hyperdense vessel Skull: Negative Sinuses/Orbits: Paranasal sinuses clear.  Negative orbit Other: None IMPRESSION: No acute abnormality Moderate atrophy, stable 5 mm hyperdensity left posterior temporal lobe is unchanged from prior studies and compatible with chronic hemorrhage. Electronically Signed   By: Franchot Gallo M.D.   On: 03/18/2020 14:54   CT CHEST WO CONTRAST  Result Date: 05/04/2020 CLINICAL DATA:  Pleural effusion. History of metastatic renal cell carcinoma. EXAM: CT CHEST WITHOUT CONTRAST TECHNIQUE: Multidetector CT imaging of the chest was performed following the standard protocol without IV contrast. COMPARISON:  March 18, 2020, April 13, 2020 FINDINGS: Cardiovascular: Three-vessel coronary artery atherosclerotic calcifications. Heart is at the upper limits of normal in size. Scattered atherosclerotic calcifications throughout the aorta. No pericardial effusion. Mediastinum/Nodes: No axillary adenopathy. RIGHT supraclavicular lymph node measures 10 mm in the short axis, previously 8 mm (series 2, image 11). LEFT supraclavicular lymph node measures 10 mm in the short axis, previously 8 mm (series 2, image 14). RIGHT paratracheal lymph node measures 10 mm, previously 8 mm (series 2, image 47). Additional RIGHT paratracheal lymph node measures 11 mm in the short axis, previously 10 mm (series 2, image 53). Lymph node posterior to the esophagus measures 8 mm, previously 5 mm (series 2, image 74). Lungs/Pleura: Moderate LEFT and trace RIGHT pleural  effusion. There is irregular consolidative opacity of the LEFT lower lobe, new since prior. There is scattered irregular consolidative opacity of the RIGHT lower lobe  which may reflect a combination of pulmonary nodules and consolidation. There are innumerable pulmonary nodules. Many of these nodules are relatively ill-defined with central lucency. The size and conspicuity of pulmonary nodules has increased in comparison to prior. Representative nodules are as follows: Nodule 1: LEFT upper lobe ill-defined nodule with central lucency measures 12 mm, previously 10 mm (series 2, image 42). Nodule 2: LEFT upper lobe ill-defined nodule with central lucency measures 9 mm, previously 6 mm (series 2, image 66). Nodule 3: RIGHT lower lobe ill-defined nodule with central lucency measures 7 mm, previously 4 mm (series 2, image 63). Upper Abdomen: Increased RIGHT adrenal nodularity measuring 3.3 x 1.9 cm, previously 2.8 x 1.4 cm (series 2, image 148). Infiltrative RIGHT renal mass is poorly visualized on current exam. Musculoskeletal: 15 mm RIGHT axillary subcutaneous mass, previously 14 mm. Mild degenerative changes of the thoracic spine. There is a lytic lesion of the RIGHT aspect of T12, increased in comparison to prior IMPRESSION: 1. Moderate LEFT and trace RIGHT pleural effusions. 2. Irregular consolidative opacity of the LEFT greater than RIGHT lower lobes may reflect a combination of pulmonary nodules and infectious/inflammatory consolidation. 3. Innumerable pulmonary nodules, increased in size and conspicuity in comparison to prior study. This is consistent with worsening metastatic disease. 4. Increased RIGHT adrenal nodularity consistent with worsening metastatic disease. 5. Increased size of lytic lesion of the RIGHT aspect of T12 consistent with worsening metastatic disease. 6. Increased mediastinal and supraclavicular adenopathy, consistent with worsening metastatic disease. 7. RIGHT axillary subcutaneous mass has mildly increased in size. This may reflect a epidermal inclusion cyst versus metastatic disease. Correlate with physical exam. Aortic Atherosclerosis (ICD10-I70.0).  Electronically Signed   By: Valentino Saxon MD   On: 04/18/2020 10:17   MR BRAIN W WO CONTRAST  Result Date: 05/03/2020 CLINICAL DATA:  Initial evaluation for acute delirium. History of renal cell carcinoma. EXAM: MRI HEAD WITHOUT AND WITH CONTRAST TECHNIQUE: Multiplanar, multiecho pulse sequences of the brain and surrounding structures were obtained without and with intravenous contrast. CONTRAST:  7.51mL GADAVIST GADOBUTROL 1 MMOL/ML IV SOLN COMPARISON:  Prior head CT from earlier the same day as well as previous MRI from 08/27/2019. FINDINGS: Brain: Examination degraded by motion artifact. Diffuse prominence of the CSF containing spaces compatible generalized cerebral atrophy. Mild patchy T2/FLAIR hyperintensity within the periventricular white matter, nonspecific, but most like related chronic microvascular ischemic disease, mild for age. 8 mm focus of diffusion abnormality seen involving the deep white matter of the posterior left frontal centrum semi ovale, consistent with an acute to subacute ischemic infarct (series 5, image 37). No associated hemorrhage or mass effect. No other evidence for acute or subacute ischemia. Again seen is a focus of susceptibility artifact at the left occipital pole, suggesting an underlying microhemorrhage (series 13, image 24). This was also seen on prior brain MRI from 08/27/2019. However, on today's exam, now seen is a small amount of T2/FLAIR signal abnormality suggestive of vasogenic edema (series 15, image 21), not seen on prior exam. No definite associated enhancement evident on this motion degraded exam. A possible small cavernoma that has bled in the interim with associated edema could also be considered. Additional foci of susceptibility artifact involving the left periatrial white matter (series 13, image 30) also seen, stable from previous. There is a small focus of associated nodular enhancement about this  lesion (series 18, image 13), stable. No significant  surrounding vasogenic edema. Again, this could reflect a small cavernoma, although a small metastatic lesion is not entirely excluded. Additional focus of susceptibility artifact at the right cerebellum also unchanged (series 13, image 17). This lesion demonstrates no appreciable enhancement or associated edema. No other mass lesion, mass effect, or midline shift. Mild ventricular prominence related to global parenchymal volume loss without hydrocephalus. No extra-axial fluid collection. Pituitary gland suprasellar region within normal limits. Midline structures intact. No other abnormal enhancement. Vascular: Major intracranial vascular flow voids are maintained. Skull and upper cervical spine: Craniocervical junction within normal limits. Bone marrow signal intensity normal. No focal marrow replacing lesion. No scalp soft tissue abnormality. Sinuses/Orbits: Globes and orbital soft tissues within normal limits. Mild mucosal thickening noted within the maxillary sinuses and ethmoidal air cells. Paranasal sinuses are otherwise clear. No mastoid effusion. Inner ear structures grossly normal. Other: None. IMPRESSION: 1. 8 mm acute to subacute ischemic infarct involving the deep white matter of the posterior left frontal centrum semi ovale. No associated hemorrhage or mass effect. 2. Small foci of susceptibility artifact involving the left occipital pole with new small volume surrounding T2/FLAIR signal abnormality, suggesting vasogenic edema. Finding is indeterminate, and could reflect a small cavernoma that has bled in the interim. A small metastatic lesion could also be considered. A short interval follow-up MRI in 2-3 months or earlier if warranted is recommended for further evaluation. 3. Additional focus of susceptibility artifact at the left periatrial white matter with associated punctate nodular enhancement, stable. Again, finding may reflect a small cavernoma or possibly metastasis. Attention at follow-up  recommended. Additional focus of susceptibility artifact at the right cerebellum without associated enhancement or edema is unchanged. 4. Generalized age-related cerebral atrophy with mild chronic microvascular ischemic disease. Electronically Signed   By: Jeannine Boga M.D.   On: 04/09/2020 22:05   CT CHEST ABDOMEN PELVIS W CONTRAST  Result Date: 03/18/2020 CLINICAL DATA:  Abdominal pain. Known metastatic renal cell carcinoma. Febrile. Evaluate for progression of disease and infection. EXAM: CT CHEST, ABDOMEN, AND PELVIS WITH CONTRAST TECHNIQUE: Multidetector CT imaging of the chest, abdomen and pelvis was performed following the standard protocol during bolus administration of intravenous contrast. CONTRAST:  147mL OMNIPAQUE IOHEXOL 300 MG/ML  SOLN COMPARISON:  None. FINDINGS: CT CHEST FINDINGS Cardiovascular: Aortic atherosclerosis. Borderline cardiomegaly, without pericardial effusion. Multivessel coronary artery atherosclerosis. No central pulmonary embolism, on this non-dedicated study. Mediastinum/Nodes: No supraclavicular adenopathy. Borderline right paratracheal adenopathy 1.0 cm on 18/2. No hilar adenopathy.  Tiny hiatal hernia. Lungs/Pleura: Trace left pleural fluid. Bilateral pulmonary nodules. Many of these are somewhat vague and ill-defined. A relatively well-defined right lower lobe pulmonary nodule measures 8 mm on 90/5. A lingular 7 mm nodule on 58/5 is likely sub solid in morphology. Medial left upper lobe ground-glass nodule of 1.0 cm on 33/5. Left base subsegmental atelectasis. Musculoskeletal: Right chest wall subcutaneous nodule of 1.4 cm on 37/2. Lytic lesion within the right-side of the T12 vertebral body measures 1.7 cm on 58/2. CT ABDOMEN PELVIS FINDINGS Hepatobiliary: Normal liver. Normal gallbladder, without biliary ductal dilatation. Pancreas: Pancreatic atrophy, without duct dilatation or acute inflammation. Spleen: Normal in size, without focal abnormality. Adrenals/Urinary  Tract: Normal left adrenal gland. Right adrenal thickening and nodularity, suspicious. Too small to characterize left renal lesions and renal sinus cysts. Infiltrative mass involving the inter and lower pole of the right kidney, including at 13.0 x 8.1 cm on transverse image 76/2. On the order of  10.0 cm on coronal image 87. More cephalad altered renal morphology may be due to tumor involvement or renal vein compression/involvement. Lack of right renal contrast excretion. No left-sided hydronephrosis. Normal urinary bladder. Stomach/Bowel: Normal remainder of the stomach. Extensive colonic diverticulosis. Normal terminal ileum and appendix. Normal small bowel. Vascular/Lymphatic: Normal aortic caliber.  Aortic atherosclerosis. Nodal metastasis versus direct tumor involvement surrounding the IVC and left renal vein, including on images 65 and 66 of series 2. Due to bolus timing, these vessels are suboptimally evaluated. No tumor extension more superiorly. Abdominal retroperitoneal adenopathy including a preaortic node of 1.6 cm on 78/2. No pelvic sidewall adenopathy. Reproductive: Normal prostate. Other: No significant free fluid. Small bilateral fat containing inguinal hernias. A fat containing periumbilical ventral abdominal wall hernia. Musculoskeletal: No acute osseous abnormality. IMPRESSION: 1. Infiltrative renal cell carcinoma with abdominal nodal and osseous metastasis. 2. Tumor and/or adenopathy surrounding the right renal vein and IVC. Suboptimal evaluation for direct vascular involvement (which is suspected) secondary to bolus timing. No extension of thrombus or tumor into the more superior portion of the IVC. 3. Pulmonary nodularity, most likely due to metastatic disease. Without priors for comparison, some of the ill-defined nodules are indeterminate and atypical infection would be a possibility. 4. Borderline mediastinal adenopathy, indeterminate. 5. Small left pleural effusion with adjacent left lower  lobe atelectasis. 6. Coronary artery atherosclerosis. Aortic Atherosclerosis (ICD10-I70.0). 7. A right chest wall subcutaneous lesion is most likely a sebaceous cyst but could be correlated with physical exam. Electronically Signed   By: Abigail Miyamoto M.D.   On: 03/18/2020 15:15   US Carotid Bilateral  Result Date: 04/16/2020 CLINICAL DATA:  Stroke symptoms, altered mental status, diabetes EXAM: BILATERAL CAROTID DUPLEX ULTRASOUND TECHNIQUE: Pearline Cables scale imaging, color Doppler and duplex ultrasound were performed of bilateral carotid and vertebral arteries in the neck. COMPARISON:  None. FINDINGS: Criteria: Quantification of carotid stenosis is based on velocity parameters that correlate the residual internal carotid diameter with NASCET-based stenosis levels, using the diameter of the distal internal carotid lumen as the denominator for stenosis measurement. The following velocity measurements were obtained: RIGHT ICA: 101/20 cm/sec CCA: 16/10 cm/sec SYSTOLIC ICA/CCA RATIO:  1.1 ECA: 54 cm/sec LEFT ICA: 92/30 cm/sec CCA: 960/45 cm/sec SYSTOLIC ICA/CCA RATIO:  0.9 ECA: 64 cm/sec RIGHT CAROTID ARTERY: No significant carotid atherosclerosis. No hemodynamically significant ICA stenosis, velocity elevation, or turbulent flow. Negative for stenosis. Normal for age. RIGHT VERTEBRAL ARTERY:  Normal antegrade flow LEFT CAROTID ARTERY: Very minimal bifurcation intimal thickening and early atherosclerotic change. No hemodynamically significant left ICA stenosis, velocity elevation, or turbulent flow. Degree of narrowing less than 50% by ultrasound criteria. LEFT VERTEBRAL ARTERY:  Normal antegrade flow IMPRESSION: No significant right ICA atherosclerosis. Negative for right ICA stenosis. Minor left carotid atherosclerosis. Degree of narrowing less than 50% by ultrasound criteria. Normal antegrade vertebral flow bilaterally Electronically Signed   By: Jerilynn Mages.  Shick M.D.   On: 04/16/2020 07:58   DG Chest Port 1 View  Result  Date: 04/19/2020 CLINICAL DATA:  Emesis.  Mental status changes. EXAM: PORTABLE CHEST 1 VIEW COMPARISON:  None. FINDINGS: Midline trachea. Normal heart size. Small left pleural effusion. No pneumothorax. Low lung volumes with resultant pulmonary interstitial prominence. Subtle left base airspace disease. IMPRESSION: Small left pleural effusion with adjacent airspace disease, atelectasis or infection. Electronically Signed   By: Abigail Miyamoto M.D.   On: 04/29/2020 05:26   ECHOCARDIOGRAM COMPLETE BUBBLE STUDY  Result Date: 04/15/2020    ECHOCARDIOGRAM REPORT   Patient Name:  Josephina Shih Date of Exam: 04/15/2020 Medical Rec #:  [id number]            Height:       74.0 in Accession #:    [id number]           Weight:       201.0 lb Date of Birth:  Jul 19, 1955            BSA:          2.178 m Patient Age:    35 years             BP:           141/94 mmHg Patient Gender: M                    HR:           104 bpm. Exam Location:  [facility] Procedure: 2D Echo, Cardiac Doppler, Color Doppler and Saline Contrast Bubble            Study Indications:     Stroke 434.91  History:         Patient has no prior history of Echocardiogram examinations.                  Risk Factors:Hypertension and Diabetes.  Sonographer:     Sherrie Sport RDCS (AE) Referring Phys:  [place] Diagnosing Phys: Nelva Bush MD IMPRESSIONS  1. Left ventricular ejection fraction, by estimation, is 55 to 60%. The left ventricle has normal function. Left ventricular endocardial border not optimally defined to evaluate regional wall motion. There is mild left ventricular hypertrophy. Left ventricular diastolic parameters are consistent with Grade I diastolic dysfunction (impaired relaxation).  2. Right ventricular systolic function is normal. The right ventricular size is normal.  3. Left atrial size was mildly dilated.  4. The mitral valve is grossly normal. No evidence of mitral valve regurgitation.  5. The aortic valve is tricuspid.  There is mild thickening of the aortic valve. Aortic valve regurgitation is not visualized. Mild aortic valve sclerosis is present, with no evidence of aortic valve stenosis.  6. Bubble study is nondiagnostic. A small right-to-left shunt cannot be excluded. FINDINGS  Left Ventricle: Left ventricular ejection fraction, by estimation, is 55 to 60%. The left ventricle has normal function. Left ventricular endocardial border not optimally defined to evaluate regional wall motion. The left ventricular internal cavity size was normal in size. There is mild left ventricular hypertrophy. Left ventricular diastolic parameters are consistent with Grade I diastolic dysfunction (impaired relaxation). Right Ventricle: The right ventricular size is normal. No increase in right ventricular wall thickness. Right ventricular systolic function is normal. Left Atrium: Left atrial size was mildly dilated. Right Atrium: Right atrial size was normal in size. Pericardium: The pericardium was not well visualized. Mitral Valve: The mitral valve is grossly normal. No evidence of mitral valve regurgitation. Tricuspid Valve: The tricuspid valve is not well visualized. Tricuspid valve regurgitation is not demonstrated. Aortic Valve: The aortic valve is tricuspid. There is mild thickening of the aortic valve. Aortic valve regurgitation is not visualized. Mild aortic valve sclerosis is present, with no evidence of aortic valve stenosis. Aortic valve mean gradient measures 3.0 mmHg. Aortic valve peak gradient measures 5.4 mmHg. Aortic valve area, by VTI measures 3.29 cm. Pulmonic Valve: The pulmonic valve was not well visualized. Pulmonic valve regurgitation is not visualized. No evidence of pulmonic stenosis. Aorta: The aortic root is normal in size and  structure. Pulmonary Artery: The pulmonary artery is of normal size. IAS/Shunts: The interatrial septum was not well visualized. Agitated saline contrast was given intravenously to evaluate for  intracardiac shunting. Bubble study is nondiagnostic. A small right-to-left shunt cannot be excluded.  LEFT VENTRICLE PLAX 2D LVIDd:         4.73 cm  Diastology LVIDs:         3.28 cm  LV e' medial:    7.29 cm/s LV PW:         1.04 cm  LV E/e' medial:  7.8 LV IVS:        1.32 cm  LV e' lateral:   7.51 cm/s LVOT diam:     2.30 cm  LV E/e' lateral: 7.6 LV SV:         58 LV SV Index:   27 LVOT Area:     4.15 cm  RIGHT VENTRICLE RV S prime:     13.10 cm/s TAPSE (M-mode): 1.6 cm LEFT ATRIUM             Index       RIGHT ATRIUM           Index LA diam:        3.90 cm 1.79 cm/m  RA Area:     10.20 cm LA Vol (A2C):   73.8 ml 33.88 ml/m RA Volume:   17.80 ml  8.17 ml/m LA Vol (A4C):   59.6 ml 27.36 ml/m LA Biplane Vol: 73.7 ml 33.84 ml/m  AORTIC VALVE                   PULMONIC VALVE AV Area (Vmax):    2.86 cm    PV Vmax:        0.84 m/s AV Area (Vmean):   2.93 cm    PV Peak grad:   2.8 mmHg AV Area (VTI):     3.29 cm    RVOT Peak grad: 4 mmHg AV Vmax:           116.00 cm/s AV Vmean:          75.400 cm/s AV VTI:            0.177 m AV Peak Grad:      5.4 mmHg AV Mean Grad:      3.0 mmHg LVOT Vmax:         79.80 cm/s LVOT Vmean:        53.200 cm/s LVOT VTI:          0.140 m LVOT/AV VTI ratio: 0.79  AORTA Ao Root diam: 3.40 cm MITRAL VALVE MV Area (PHT): 3.60 cm    SHUNTS MV Decel Time: 211 msec    Systemic VTI:  0.14 m MV E velocity: 57.20 cm/s  Systemic Diam: 2.30 cm MV A velocity: 91.20 cm/s MV E/A ratio:  0.63 Christopher End MD Electronically signed by Nelva Bush MD Signature Date/Time: 04/15/2020/11:04:48 AM    Final    Assessment/Plan Hunter Holter Whittemoreis a 64 y.o.malewith medical history significant forrenal cell carcinoma on oral chemotherapy, hypothyroidism, diabetes mellitus, hypertension and GERD who was brought into the ER by EMS for evaluation of mental status changes.  1.  Possible ischemia to the bilateral lower extremity: Vascular surgery consulted by Dr. Mal Misty for change in physical  exam.  Patient exam notable for what seems like bilateral lower extremity ischemia.  Bilateral feet are cold and mottled.  Unable to palpate pedal pulses.  Patient with history of  metastatic renal cell carcinoma on oral chemotherapy.  Acute stroke.  Tried to discuss moving forward with a left lower extremity angiogram possible right possible intervention however the patient continues to gaze as a corner of the room without any meaningful conversation.  Reached out to the patient's family member Mateo Flow discussed the procedure, risks and benefits.  All questions answered.  Patient's friend would like to proceed.  2.  Acute stroke: Patient with possible bilateral lower extremity ischemia.   Due to the patient's recent diagnosis of brain metastases unable to use TPA during procedure. Neurology following.  3.  Palliative consult: Possible bilateral lower extremity ischemia in setting of metastatic cancer ?  Consider palliative consult  Discussed with Dr. Mayme Genta, PA-C  04/29/2020 11:37 AM  This note was created with Dragon medical transcription system.  Any error is purely unintentional

## 2020-04-17 NOTE — Progress Notes (Addendum)
Subjective: Patient more delayed with responses today.  MRI's attempted overnight but patient refused due to pain.  Today patient with mottled, edematous lower extremities.  Appears to have had some overflow incontinence as well.   Spoke with POA.  It appears patient has had some incontinence since at least September.  Was to have a CT of the lumbar spine at that time after discussion with oncologist but patient had an acute decompensation prior to that being able to be performed.  Incontinence has been progressive since that time.    Objective: Current vital signs: BP 90/63 (BP Location: Left Arm)   Pulse (!) 133   Temp (!) 96.8 F (36 C) (Axillary)   Resp 20   Ht 6\' 2"  (1.88 m)   Wt 91.2 kg   SpO2 100%   BMI 25.81 kg/m  Vital signs in last 24 hours: Temp:  [96.8 F (36 C)-98.5 F (36.9 C)] 96.8 F (36 C) (10/14 1017) Pulse Rate:  [110-138] 133 (10/14 1017) Resp:  [16-20] 20 (10/14 1017) BP: (90-117)/(63-78) 90/63 (10/14 1017) SpO2:  [94 %-100 %] 100 % (10/14 1017)  Intake/Output from previous day: No intake/output data recorded. Intake/Output this shift: Total I/O In: -  Out: 350 [Urine:350] Nutritional status:  Diet Order            Diet Carb Modified Fluid consistency: Thin; Room service appropriate? Yes  Diet effective now                 Neurologic Exam: Mental Status: Alert.  Speech minimal and delayed.  Cranial Nerves: II: Blinks to bilateral confrontation III,IV, VI: ptosis not present, extra-ocular motions intact bilaterally V,VII: face symmetric, facial light touch sensation normal bilaterally VIII: hearing normal bilaterally IX,X: gag reflex present XI: bilateral shoulder shrug XII: midline tongue extension Motor: Trace movement noted in the LLE.  1-2/5 strength noted in the RLE Sensory: Minimal sensation in the LLE   Lab Results: Basic Metabolic Panel: Recent Labs  Lab 04/21/2020 0358 04/14/20 0150 04/16/20 0437  NA 135 141 140  K 4.5 4.0  3.8  CL 101 106 105  CO2 20* 24 25  GLUCOSE 167* 108* 170*  BUN 12 10 10   CREATININE 0.99 0.99 1.24  CALCIUM 8.4* 8.4* 8.2*    Liver Function Tests: Recent Labs  Lab 04/25/2020 0358 04/14/20 0150  AST 26 24  ALT 10 9  ALKPHOS 80 69  BILITOT 0.3 0.3  PROT 7.9 7.2  ALBUMIN 2.7* 2.3*   Recent Labs  Lab 04/15/2020 0358  LIPASE 20   No results for input(s): AMMONIA in the last 168 hours.  CBC: Recent Labs  Lab 04/16/2020 0358 05/03/2020 0358 04/10/2020 1956 04/28/2020 1956 04/14/20 0150 04/15/20 0014 04/15/20 0318 04/16/20 0437 04/17/20 0342  WBC 6.7   < > 5.0   < > 4.6 4.7 4.6 6.4 8.9  NEUTROABS 5.3  --  3.8  --   --   --  3.1  --  7.2  HGB 7.9*   < > 6.9*   < > 7.5* 8.0* 8.3* 8.5* 8.3*  HCT 27.1*   < > 23.3*   < > 25.4* 27.0* 27.9* 29.0* 27.9*  MCV 76.1*   < > 74.9*   < > 74.9* 76.7* 76.6* 77.7* 77.3*  PLT 352   < > 323   < > 349 352 355 366 402*   < > = values in this interval not displayed.    Cardiac Enzymes: No results for input(s): CKTOTAL,  CKMB, CKMBINDEX, TROPONINI in the last 168 hours.  Lipid Panel: Recent Labs  Lab 04/16/20 0437  CHOL 170  TRIG 175*  HDL 47  CHOLHDL 3.6  VLDL 35  LDLCALC 88    CBG: Recent Labs  Lab 04/16/20 0916 04/16/20 1206 04/16/20 1703 04/16/20 2107 04/30/2020 0800  GLUCAP 97 131* 169* 102* 171*    Microbiology: Results for orders placed or performed during the hospital encounter of 04/06/2020  Blood culture (routine single)     Status: None (Preliminary result)   Collection Time: 04/18/2020  3:58 AM   Specimen: BLOOD  Result Value Ref Range Status   Specimen Description BLOOD BLOOD RIGHT FOREARM  Final   Special Requests   Final    BOTTLES DRAWN AEROBIC AND ANAEROBIC Blood Culture results may not be optimal due to an excessive volume of blood received in culture bottles   Culture   Final    NO GROWTH 4 DAYS Performed at [facility], [address], [place], [place] [address]    Report Status PENDING   Incomplete  Respiratory Panel by RT PCR (Flu A&B, Covid) - Nasopharyngeal Swab     Status: None   Collection Time: 04/29/2020  3:58 AM   Specimen: Nasopharyngeal Swab  Result Value Ref Range Status   SARS Coronavirus 2 by RT PCR NEGATIVE NEGATIVE Final    Comment: (NOTE) SARS-CoV-2 target nucleic acids are NOT DETECTED.  The SARS-CoV-2 RNA is generally detectable in upper respiratoy specimens during the acute phase of infection. The lowest concentration of SARS-CoV-2 viral copies this assay can detect is 131 copies/mL. A negative result does not preclude SARS-Cov-2 infection and should not be used as the sole basis for treatment or other patient management decisions. A negative result may occur with  improper specimen collection/handling, submission of specimen other than nasopharyngeal swab, presence of viral mutation(s) within the areas targeted by this assay, and inadequate number of viral copies (<131 copies/mL). A negative result must be combined with clinical observations, patient history, and epidemiological information. The expected result is Negative.  Fact Sheet for Patients:  PinkCheek.be  Fact Sheet for Healthcare Providers:  GravelBags.it  This test is no t yet approved or cleared by the [place] FDA and  has been authorized for detection and/or diagnosis of SARS-CoV-2 by FDA under an Emergency Use Authorization (EUA). This EUA will remain  in effect (meaning this test can be used) for the duration of the COVID-19 declaration under Section 564(b)(1) of the Act, 21 U.S.C. section 360bbb-3(b)(1), unless the authorization is terminated or revoked sooner.     Influenza A by PCR NEGATIVE NEGATIVE Final   Influenza B by PCR NEGATIVE NEGATIVE Final    Comment: (NOTE) The Xpert Xpress SARS-CoV-2/FLU/RSV assay is intended as an aid in  the diagnosis of influenza from Nasopharyngeal swab specimens and  should not  be used as a sole basis for treatment. Nasal washings and  aspirates are unacceptable for Xpert Xpress SARS-CoV-2/FLU/RSV  testing.  Fact Sheet for Patients: PinkCheek.be  Fact Sheet for Healthcare Providers: GravelBags.it  This test is not yet approved or cleared by the [place] FDA and  has been authorized for detection and/or diagnosis of SARS-CoV-2 by  FDA under an Emergency Use Authorization (EUA). This EUA will remain  in effect (meaning this test can be used) for the duration of the  Covid-19 declaration under Section 564(b)(1) of the Act, 21  U.S.C. section 360bbb-3(b)(1), unless the authorization is  terminated or revoked. Performed  at [facility], [address] [place], [place] [address]   Culture, blood (single)     Status: None (Preliminary result)   Collection Time: 05/02/2020  1:36 PM   Specimen: BLOOD  Result Value Ref Range Status   Specimen Description BLOOD LEFT [facility]  Final   Special Requests   Final    BOTTLES DRAWN AEROBIC ONLY Blood Culture adequate volume   Culture   Final    NO GROWTH 4 DAYS Performed at [facility], [address], [place], [place] [address]    Report Status PENDING  Incomplete  Urine culture     Status: None   Collection Time: 05/02/2020  3:10 PM   Specimen: Urine, Random  Result Value Ref Range Status   Specimen Description   Final    URINE, RANDOM Performed at [facility], [address] [place], [place] [address]    Special Requests   Final    NONE Performed at [facility], [address], [place], [place] [address]    Culture   Final    NO GROWTH Performed at [facility], [place] [address] [place], [place] [address]    Report Status 04/14/2020 FINAL  Final    Coagulation Studies: No results for input(s): LABPROT, INR in the last 72 hours.  Imaging: US Carotid Bilateral  Result Date: 04/16/2020 CLINICAL DATA:   Stroke symptoms, altered mental status, diabetes EXAM: BILATERAL CAROTID DUPLEX ULTRASOUND TECHNIQUE: Pearline Cables scale imaging, color Doppler and duplex ultrasound were performed of bilateral carotid and vertebral arteries in the neck. COMPARISON:  None. FINDINGS: Criteria: Quantification of carotid stenosis is based on velocity parameters that correlate the residual internal carotid diameter with NASCET-based stenosis levels, using the diameter of the distal internal carotid lumen as the denominator for stenosis measurement. The following velocity measurements were obtained: RIGHT ICA: 101/20 cm/sec CCA: 33/82 cm/sec SYSTOLIC ICA/CCA RATIO:  1.1 ECA: 54 cm/sec LEFT ICA: 92/30 cm/sec CCA: 505/39 cm/sec SYSTOLIC ICA/CCA RATIO:  0.9 ECA: 64 cm/sec RIGHT CAROTID ARTERY: No significant carotid atherosclerosis. No hemodynamically significant ICA stenosis, velocity elevation, or turbulent flow. Negative for stenosis. Normal for age. RIGHT VERTEBRAL ARTERY:  Normal antegrade flow LEFT CAROTID ARTERY: Very minimal bifurcation intimal thickening and early atherosclerotic change. No hemodynamically significant left ICA stenosis, velocity elevation, or turbulent flow. Degree of narrowing less than 50% by ultrasound criteria. LEFT VERTEBRAL ARTERY:  Normal antegrade flow IMPRESSION: No significant right ICA atherosclerosis. Negative for right ICA stenosis. Minor left carotid atherosclerosis. Degree of narrowing less than 50% by ultrasound criteria. Normal antegrade vertebral flow bilaterally Electronically Signed   By: Jerilynn Mages.  Shick M.D.   On: 04/16/2020 07:58    Medications:  I have reviewed the patient's current medications. Scheduled: . amLODipine  10 mg Oral Daily  . amoxicillin-clavulanate  1 tablet Oral Q12H  . aspirin EC  81 mg Oral Daily  . baclofen  10 mg Oral TID  . feeding supplement  237 mL Oral TID BM  . heparin  4,500 Units Intravenous Once  . insulin aspart  0-15 Units Subcutaneous TID WC  . levothyroxine   150 mcg Oral Daily  . oxyCODONE  20 mg Oral Q12H  . pantoprazole (PROTONIX) IV  40 mg Intravenous Q12H  . QUEtiapine  50 mg Oral QHS  . senna  2 tablet Oral BID  . tamsulosin  0.4 mg Oral Daily    Assessment/Plan: 64 y.o.malewith medical history significant forrenal cell carcinoma on oral chemotherapy, hypothyroidism,  diabetes mellitus, hypertension and GERD who was brought into the ER by EMS for evaluation of mental status changes. His mental status has been gradually getting worse over the last few months but acutely became worse on 10/9. At this point seems to have worsened further since admission.   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 There are areas in the left periatrial white matter and occipital lobe that may correspond to previous findings but do not have these images for comparison.  The size of the acute infarct is very small and not likely in and of itself the etiology of the mental status changes although it may be contributing along with his increased chemotherapy dose and thyroid abnormalities. Etiology unclear but may be of embolic etiology. Hypercoagulability related to cancer a possibility as well.  Carotid dopplers show no evidence of hemodynamically significant stenosis.  Echocardiogram shows no cardiac source of emboli with an EF of 55-60%.  A1c 7.3, LDL 88.  TSH elevated. Patient also on presentation with some back and abdominal pain.  Was scheduled to have MRI of the lumbar and thoracic spines.  Has been refusing testing due to pain.  Today unclear that patient able to give full consent and have contacted POA.  Am concerned about worsening lower extremity weakness than at last evaluation.  Due to difficult to control pain though will alter studies to be done so that patient will not have to lie as flat for as long and can rule out cord involvement of tumor.      Recommendations: 1. STAT CT  of the lumbar and thoracic spines 2. Further thyroid investigation to rule out thyroid abnormalities that may be contributing to altered mental status 3. Statin for lipid management with target LDL<70. 4. Continue ASA 5. Therapy as tolerated 6. Telemetry monitoring 7. Frequent neuro checks 8. EEG  Case discussed with Dr. Mal Misty and POA   LOS: 4 days   Alexis Goodell, MD Neurology  04/05/2020  10:32 AM

## 2020-04-17 NOTE — Progress Notes (Signed)
Pt taken to vascular lab via transport

## 2020-04-17 NOTE — Op Note (Signed)
[facility]  Percutaneous Study/Intervention Procedural Note   Date of Surgery: 04/12/2020  Surgeon(s):Nobie Alleyne    Assistants:none  Pre-operative Diagnosis: Apparent ischemia of both lower extremities with nonpalpable pulses and pallor  Post-operative diagnosis:  No arterial obstruction in the aorta, iliac arteries, or bilateral lower extremities.  Extremely slow circulation time.  Procedure(s) Performed:             1.  Ultrasound guidance for vascular access right femoral artery             2.  Catheter placement into left anterior tibial artery and left posterior tibial artery from right femoral approach             3.  Aortogram and selective bilateral lower extremity angiogram including selective images of the left anterior tibial and left posterior tibial arteries             4.  StarClose closure device right femoral artery  EBL: 10 cc  Contrast: 50 cc  Fluoro Time: 3.4 minutes  Moderate Conscious Sedation Time: approximately 38 minutes using 2 mg of Versed and 50 mcg of Fentanyl              Indications:  Patient is a 64 y.o.male with mottling of the lower extremities, no doppler signals or palpable pulses, and extreme pallor. The patient is brought in for angiography for further evaluation and potential treatment.  Due to the limb threatening nature of the situation, angiogram was performed for attempted limb salvage. The patient is aware that if the procedure fails, amputation would be expected.  The patient also understands that even with successful revascularization, amputation may still be required due to the severity of the situation. Risks and benefits are discussed and informed consent is obtained.   Procedure:  The patient was identified and appropriate procedural time out was performed.  The patient was then placed supine on the table and prepped and draped in the usual sterile fashion. Moderate conscious sedation was administered during a  face to face encounter with the patient throughout the procedure with my supervision of the RN administering medicines and monitoring the patient's vital signs, pulse oximetry, telemetry and mental status throughout from the start of the procedure until the patient was taken to the recovery room. Ultrasound was used to evaluate the right common femoral artery.  It was patent .  A digital ultrasound image was acquired.  A Seldinger needle was used to access the right common femoral artery under direct ultrasound guidance and a permanent image was performed.  A 0.035 J wire was advanced without resistance and a 5Fr sheath was placed.  Pigtail catheter was placed into the aorta and an AP aortogram was performed. This demonstrated normal renal arteries and normal aorta and iliac segments without significant stenosis. I then crossed the aortic bifurcation and advanced to the left femoral head. Selective left lower extremity angiogram was then performed. The circulation time was extremely slow and imaging was difficult due to this.  There was no stenosis, thrombus, or obstruction in the left common femoral artery, profunda femoris artery, superficial femoral artery, or popliteal artery.  The circulation time is so slow the only way to image the tibial vessels were is with selective cannulation of each tibial vessel.  I first cannulated the left anterior tibial artery with a Kumpe catheter and a Magic torque wire and perform selective imaging the anterior tibial artery which was widely patent to the foot although the circulation  time was extremely slow.  I then cannulated the posterior tibial artery with the Kumpe catheter and the Magic torque wire and selective image showed this to be widely patent to the foot without stenosis although again, circulation time was slow.  I then evaluated the right lower extremity through the right femoral sheath.  Imaging through the sheath was somewhat limited due to the extremely slow  circulation time, but the common femoral artery, profunda femoris artery, superficial femoral artery, and popliteal artery were all widely patent without significant stenosis, thrombus, or obstruction.  There was a normal tibial trifurcation in the proximal portions of the tibial vessels appear to be normal although the circulation time was too slow to opacify the more distal tibial vessels.  I elected to terminate the procedure. The sheath was removed and StarClose closure device was deployed in the right femoral artery with excellent hemostatic result. The patient was taken to the recovery room in stable condition having tolerated the procedure well.  Findings:               Aortogram:  2 left renal arteries and one right renal artery which were normal.  The aorta and iliac arteries were normal and widely patent without flow-limiting stenosis or signs of obstruction             Left lower Extremity:  The circulation time was extremely slow and imaging was difficult due to this.  There was no stenosis, thrombus, or obstruction in the left common femoral artery, profunda femoris artery, superficial femoral artery, or popliteal artery.  The circulation time is so slow the only way to image the tibial vessels were is with selective cannulation of each tibial vessel.  I first cannulated the left anterior tibial artery with a Kumpe catheter and a Magic torque wire and perform selective imaging the anterior tibial artery which was widely patent to the foot although the circulation time was extremely slow.  I then cannulated the posterior tibial artery with the Kumpe catheter and the Magic torque wire and selective image showed this to be widely patent to the foot without stenosis although again, circulation time was slow.  Right Lower Extremity:  Imaging through the sheath was somewhat limited due to the extremely slow circulation time, but the common femoral artery, profunda femoris artery, superficial femoral  artery, and popliteal artery were all widely patent without significant stenosis, thrombus, or obstruction.  There was a normal tibial trifurcation in the proximal portions of the tibial vessels appear to be normal although the circulation time was too slow to opacify the more distal tibial vessels.   Disposition: Patient was taken to the recovery room in stable condition having tolerated the procedure well.  Complications: None  Leotis Pain 04/10/2020 12:35 PM   This note was created with Dragon Medical transcription system. Any errors in dictation are purely unintentional.

## 2020-04-17 NOTE — Plan of Care (Signed)

## 2020-04-17 NOTE — Progress Notes (Signed)
Pharmacy Antibiotic Note  Hunter Davila is a 64 y.o. male with PMH significant for renal cell carcinoma on oral chemotherapy, hypothyroidism, DM, HTN, and GERD. Patient presented to ED c/o worsening R flank pain. Patient antibiotic course includes azithromycin and ceftriaxone>unasyn>PO augmentin. 10/10 CXR small L pleural effusion with adjacent airspace disease, atelectasis or infection. Pharmacy has been consulted for Cefepime and Vancomycin dosing for sepsis likely secondary to PNA.  10/10 Lactic acid 2.4>1.4; spiked to 3.1 on 10/14  Day 5 total abx. No new Scr today (10/14) - likely would have been elevated from Scr 1.24 yesterday (10/13); afebrile  Plan: Will give Vancomycin 2000mg  IV loading dose followed by 1000mg  IV q12h in case Scr increased. Will monitor closely and dose adjust; patient Scr might improve tomorrow (10/15) with MIVF  Will give Cefepime 2g IV q8 hours  Monitor renal function and adjust doses as clinically indicated. Obtain vanc level prior to 4th or 5th dose  Height: 6\' 2"  (188 cm) Weight: 91.2 kg (201 lb 1 oz) IBW/kg (Calculated) : 82.2  Temp (24hrs), Avg:98.3 F (36.8 C), Min:96.8 F (36 C), Max:99.3 F (37.4 C)  Recent Labs  Lab 04/08/2020 0358 05/01/2020 0358 04/14/2020 0641 05/03/2020 1956 05/01/2020 1956 04/14/20 0150 04/15/20 0014 04/15/20 0318 04/16/20 0437 04/11/2020 0342 05/01/2020 1448  WBC 6.7   < >  --  5.0   < > 4.6 4.7 4.6 6.4 8.9  --   CREATININE 0.99  --   --   --   --  0.99  --   --  1.24  --   --   LATICACIDVEN 2.4*  --  2.5* 1.4  --   --   --   --   --   --  3.1*   < > = values in this interval not displayed.    Estimated Creatinine Clearance: 71.8 mL/min (by C-G formula based on SCr of 1.24 mg/dL).    No Known Allergies  Antimicrobials this admission: 10/12 Augmentin >> 10/14 10/11 Unasyn >> 10/12 10/10 Azithromycin >> 10/12 10/14 Cefazolin x1 10/10 Ceftriaxone >> 10/11 10/14 Vancomycin >> 10/14 Cefepime >>  Microbiology  results: 10/10 BCx: NGTD 10/14 Bcx: pending 10/10 UCx: no growth   Thank you for allowing pharmacy to be a part of this patient's care.  Sherilyn Banker, PharmD Pharmacy Resident  04/17/2020 5:03 PM

## 2020-04-17 NOTE — Consult Note (Addendum)
ANTICOAGULATION CONSULT NOTE - Initial Consult  Pharmacy Consult for Heparin Drip Indication: possible acute ischemic leg  No Known Allergies  Patient Measurements: Height: 6\' 2"  (188 cm) Weight: 91.2 kg (201 lb) IBW/kg (Calculated) : 82.2 Heparin Dosing Weight: 91.2kg  Vital Signs: Temp: 96.8 F (36 C) (10/14 1017) Temp Source: Axillary (10/14 1017) BP: 90/63 (10/14 1017) Pulse Rate: 133 (10/14 1017)  Labs: Recent Labs    04/15/20 0318 04/15/20 0318 04/16/20 0437 04/08/2020 0342  HGB 8.3*   < > 8.5* 8.3*  HCT 27.9*  --  29.0* 27.9*  PLT 355  --  366 402*  CREATININE  --   --  1.24  --    < > = values in this interval not displayed.    Estimated Creatinine Clearance: 71.8 mL/min (by C-G formula based on SCr of 1.24 mg/dL).   Medical History: Past Medical History:  Diagnosis Date  . Cancer ([place])   . Diabetes mellitus without complication ([place])   . Hypertension   . Renal disorder    Renal cancer    Medications:  No PTA anticoagulation, patient received 1 dose of Enoxaparin inpatient and then was placed on SCD's  Assessment: 64 yo male with renal cell carcinoma and brain mets, iron deficiency anemia/acute on chronic anemia: Status post transfusion with 1 unit of PRBCs and status post iron infusion.  GI concluded no evidence of active bleed after consulted for fecal occult + stool 10/10 .  Goal of Therapy:  Heparin level 0.3-0.7 units/ml Monitor platelets by anticoagulation protocol: Yes   Plan:  Give 4500 units bolus x 1 Start heparin infusion at 1400 units/hr Check anti-Xa level in 6 hours and daily while on heparin Continue to monitor H&H and platelets  Lu Duffel, PharmD, BCPS Clinical Pharmacist 04/30/2020 10:28 AM

## 2020-04-17 NOTE — Progress Notes (Addendum)
Critical lactic acid 3.1 sent to md

## 2020-04-17 NOTE — Progress Notes (Signed)
PHARMACY - PHYSICIAN COMMUNICATION CRITICAL VALUE ALERT - BLOOD CULTURE IDENTIFICATION (BCID)  Hunter Davila is an 64 y.o. male who presented to [facility] on 04/15/2020 with a chief complaint of worsening right flank pain.  Assessment:  Blood culture with 1 of 4 bottles (aerobic) with GPR  Name of physician (or Provider) Contacted: Damita Dunnings  Current antibiotics: Cefepime and Vancomycin  Changes to prescribed antibiotics recommended:  Patient is on recommended antibiotics - No changes needed  No results found for this or any previous visit.  Paulina Fusi, PharmD, BCPS 04/14/2020 8:16 PM

## 2020-04-17 NOTE — Progress Notes (Signed)
PT Cancellation Note  Patient Details Name: Hunter Davila MRN: [id number] DOB: [birth year]   Cancelled Treatment:    Reason Eval/Treat Not Completed:  (Chart reviewed for planned treatment session.  Per RN, patient not feeling well today; recommends hold on treatment for now.  Will continue efforts at later time/date as medically appropriate and available.)   Haygen Zebrowski H. Owens Shark, PT, DPT, NCS 04/25/2020, 10:08 AM [phone]

## 2020-04-17 NOTE — Progress Notes (Signed)
OT Cancellation Note  Patient Details Name: Hunter Davila MRN: [id number] DOB: [birth year]   Cancelled Treatment:    Reason Eval/Treat Not Completed: Patient at procedure or test/ unavailable. Pt receiving imaging, unavailable for OT tx at this time. Continue at transfer in place per vascular. Will re-attempt OT tx/re-eval as appropriate at later date/time as available and medically appropriate.   Jeni Salles, MPH, MS, OTR/L ascom [phone] 04/28/2020, 4:40 PM

## 2020-04-17 NOTE — Procedures (Signed)
Patient presently in [place]

## 2020-04-17 NOTE — Progress Notes (Signed)
Edema noted on pt right leg. Pt complaining of pain on that leg. Right feet cold to touch. Melanie Danny/NP paged via epic page. Will cont to monitor the pt.

## 2020-04-17 NOTE — Progress Notes (Signed)
Case was discussed with Dr. Delana Meyer, vascular surgeon, to evaluate patient with possible acute ischemic right and left legs.

## 2020-04-17 NOTE — Progress Notes (Signed)
Progress Note    Hunter Davila  TOI:[id number] DOB: [birth year]  DOA: 04/11/2020 PCP: Patient, No Pcp Per      Brief Narrative:    Medical records reviewed and are as summarized below:  TERIQUE KAWABATA is a 64 y.o. male with medical history significant for renal cell carcinoma on oral chemotherapy, hypothyroidism, diabetes mellitus, hypertension, GERD.  He was brought to the hospital because of right flank pain and altered mental status.  Reportedly, patient has had intermittent confusion and has been having worsening pain in the right flank and has also been having pain in the back.  He also reported difficulty walking, weakness in bilateral legs and had incontinence of urine.  He had also been dealing with insomnia.  MRI brain revealed acute to subacute left posterior frontal infarct.  There there are also 2 lesions suspicious for metastatic disease in the brain.  Neurologist was consulted and he recommended dual antiplatelet therapy with aspirin and Plavix for 21 days followed by aspirin monotherapy.  He was evaluated by oncologist for metastatic renal cell carcinoma.  There was some concern that chemotherapy was contributing to altered mental status.  He was previously on lenvatinib and axitinib but these have been discontinued because of their association with encephalopathy.  He was evaluated by the gastroenterologist for acute on chronic anemia.  He was transfused with 1 unit of packed red blood cells and was also given IV iron infusion.  However, there is no evidence of active GI bleeding.  Endoscopic therapy has been deferred because of acute stroke.    Assessment/Plan:   Principal Problem:   Acute metabolic encephalopathy Active Problems:   Renal cancer ([place])   Diabetes mellitus without complication ([place])   Hypertension   GERD (gastroesophageal reflux disease)   Hypothyroidism   CAP (community acquired pneumonia)   Lactic acidosis   Anemia of chronic  disease   Aspiration pneumonia (H[facility])   Failure to thrive in adult   Iron deficiency anemia due to chronic blood loss   Severe protein-calorie malnutrition ([facility])   Severe back pain   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HCC)   Nutrition Problem: Increased nutrient needs Etiology: cancer and cancer related treatments (metastatic renal cancer on oral chemotherapy)  Signs/Symptoms: estimated needs   Body mass index is 25.81 kg/m.   S/p aortogram and selective bilateral lower extremity angiogram for suspected acute ischemia of bilateral legs on 04/11/2020.  However, there was no evidence of arterial obstruction in the aorta, iliac arteries or bilateral lower extremities.  There was evidence of extremely slow circulation time.  EKG showed sinus tachycardia.  There was no evidence of atrial fibrillation.  Recent 2D echo showed normal EF and grade 1 diastolic dysfunction.  SIRS /suspected sepsis in a patient with underlying immunocompromised state: He is hypotensive and tachycardic.  Lactic acid was 3.1 and procalcitonin was 3.14.  Blood cultures have been obtained.  Urinalysis and chest x-ray have been ordered.  Patient will be started on IV vancomycin and cefepime for presumed sepsis pending test results.  He was also given normal saline boluses and maintenance fluids.  Acute metabolic encephalopathy: This is thought to be multifactorial.  It is intermittent.   Severe back pain: Spoke to MRI department to find out why MRI of the lumbar and thoracic spine had not been done.  I was informed that patient had been pressing the button for help and requested to come out of the MRI machine any time he was put in the  MRI machine.  Order has been changed to CT thoracic and lumbar spine.  Left lower lobe aspiration pneumonia: Discontinue Augmentin.  Renal cell carcinoma with brain metastasis: Lenvatinib and axitinib have been discontinued because of encephalopathy.  Follow-up with oncologist as an outpatient.  Acute left  posterior frontal ischemic stroke: Neurologist recommended dual antiplatelet with aspirin and Plavix for 21 days followed by aspirin monotherapy.  Patient has been started on aspirin.  Plavix will not initiated at this time c any endoscopic or operative intervention is planned.   Iron deficiency anemia/acute on chronic anemia: Status post transfusion with 1 unit of PRBCs and status post iron infusion.  No endoscopic work-up planned for now because of acute stroke.  Severe protein calorie malnutrition: Continue nutritional supplements.  Type II DM: Humalog as needed for hyperglycemia.  Lactic acidosis: This had previously improved to 1.4 04/19/2020 but it has increased again to 3.1.  Monitor levels.  Case was discussed with neurologist, Dr. Doy Mince and vascular surgeon, Dr. Delana Meyer.  Plan of care was discussed with Ms. Jiles Crocker, his healthcare power of attorney.`   CRITICAL CARE Performed by: Jennye Boroughs   Total critical care time: 40 minutes  Critical care time was exclusive of separately billable procedures and treating other patients.  Critical care was necessary to treat or prevent imminent or life-threatening deterioration.  Critical care was time spent personally by me on the following activities: development of treatment plan with patient and/or surrogate as well as nursing, discussions with consultants, evaluation of patient's response to treatment, examination of patient, obtaining history from patient or surrogate, ordering and performing treatments and interventions, ordering and review of laboratory studies, ordering and review of radiographic studies, pulse oximetry and re-evaluation of patient's condition.    Diet Order            Diet NPO time specified  Diet effective now                    Consultants:  Neurologist, Dr. Doy Mince  Oncologist, Dr. Susy Manor  Gastroenterologist, Dr. Bonna Gains  Procedures:  None    Medications:   .  amoxicillin-clavulanate  1 tablet Oral Q12H  . aspirin EC  81 mg Oral Daily  . baclofen  10 mg Oral TID  . feeding supplement  237 mL Oral TID BM  . fentaNYL      . insulin aspart  0-15 Units Subcutaneous TID WC  . levothyroxine  150 mcg Oral Daily  . midazolam      . oxyCODONE  20 mg Oral Q12H  . pantoprazole (PROTONIX) IV  40 mg Intravenous Q12H  . QUEtiapine  50 mg Oral QHS  . senna  2 tablet Oral BID  . tamsulosin  0.4 mg Oral Daily   Continuous Infusions: . sodium chloride 75 mL/hr at 04/21/2020 1548  . clindamycin    . sodium chloride       Anti-infectives (From admission, onward)   Start     Dose/Rate Route Frequency Ordered Stop   04/18/20 0600  ceFAZolin (ANCEF) IVPB 2g/100 mL premix       Note to Pharmacy: To be given in specials   2 g 200 mL/hr over 30 Minutes Intravenous  Once 04/25/2020 1106 04/28/2020 1222   04/09/2020 1108  clindamycin (CLEOCIN) 300 MG/50ML IVPB       Note to Pharmacy: Corlis Hove   : cabinet override      04/10/2020 1108 05/01/2020 2314   04/15/20 1100  amoxicillin-clavulanate (AUGMENTIN) 875-125 MG  per tablet 1 tablet        1 tablet Oral Every 12 hours 04/15/20 1000 04/19/20 0959   04/15/20 1000  azithromycin (ZITHROMAX) tablet 500 mg  Status:  Discontinued        500 mg Oral Daily 04/15/20 0841 04/15/20 1000   04/14/20 1400  Ampicillin-Sulbactam (UNASYN) 3 g in sodium chloride 0.9 % 100 mL IVPB  Status:  Discontinued        3 g 200 mL/hr over 30 Minutes Intravenous Every 6 hours 04/14/20 1239 04/15/20 1000   05/01/2020 0900  cefTRIAXone (ROCEPHIN) 2 g in sodium chloride 0.9 % 100 mL IVPB  Status:  Discontinued        2 g 200 mL/hr over 30 Minutes Intravenous Every 24 hours 04/07/2020 0848 04/25/2020 0911   04/16/2020 0900  azithromycin (ZITHROMAX) 500 mg in sodium chloride 0.9 % 250 mL IVPB  Status:  Discontinued        500 mg 250 mL/hr over 60 Minutes Intravenous Every 24 hours 04/14/2020 0848 04/20/2020 0911   04/21/2020 0545  cefTRIAXone (ROCEPHIN) 2 g in  sodium chloride 0.9 % 100 mL IVPB  Status:  Discontinued        2 g 200 mL/hr over 30 Minutes Intravenous Every 24 hours 04/18/2020 0542 04/14/20 1239   04/21/2020 0545  azithromycin (ZITHROMAX) 500 mg in sodium chloride 0.9 % 250 mL IVPB  Status:  Discontinued        500 mg 250 mL/hr over 60 Minutes Intravenous Every 24 hours 04/14/2020 0542 04/15/20 0841             Family Communication/Anticipated D/C date and plan/Code Status   DVT prophylaxis: Place and maintain sequential compression device Start: 04/15/20 1138     Code Status: DNR  Family Communication: None Disposition Plan:    Status is: Inpatient  Remains inpatient appropriate because:Ongoing diagnostic testing needed not appropriate for outpatient work up and Unsafe d/c plan   Dispo: The patient is from: Home              Anticipated d/c is to: Home              Anticipated d/c date is: 1 day              Patient currently is not medically stable to d/c.           Subjective:   Interval events noted.  He complained of severe pain in bilateral legs and feet this morning.    Objective:    Vitals:   04/18/2020 1345 04/21/2020 1400 04/29/2020 1500 04/26/2020 1546  BP: 101/70 103/80 99/67 92/69   Pulse: (!) 122 (!) 121 (!) 120 (!) 137  Resp: 18 18 16 16   Temp:   98.5 F (36.9 C) 98.5 F (36.9 C)  TempSrc:   Axillary Oral  SpO2: 98% 98% 98% 96%  Weight:      Height:       No data found.   Intake/Output Summary (Last 24 hours) at 04/18/2020 1631 Last data filed at 04/17/2020 0900 Gross per 24 hour  Intake 0 ml  Output 350 ml  Net -350 ml   Filed Weights   05/03/2020 0345 04/17/20 1132  Weight: 91.2 kg 91.2 kg    Exam:  GEN: NAD SKIN: Mottled appearance of bilateral lower legs and feet EYES: No pallor or icterus ENT: MMM CV: RRR PULM: CTA B ABD: soft, ND, NT, +BS CNS: Alert and oriented to person and place.  Non focal EXT: Mild bilateral leg edema.  Tenderness in bilateral feet legs and  feet.  Bilateral feet are cold to touch.  Unable to palpate bilateral dorsalis pedis pulses.    Data Reviewed:   I have personally reviewed following labs and imaging studies:  Labs: Labs show the following:   Basic Metabolic Panel: Recent Labs  Lab 04/15/2020 0358 04/14/2020 0358 04/14/20 0150 04/16/20 0437  NA 135  --  141 140  K 4.5   < > 4.0 3.8  CL 101  --  106 105  CO2 20*  --  24 25  GLUCOSE 167*  --  108* 170*  BUN 12  --  10 10  CREATININE 0.99  --  0.99 1.24  CALCIUM 8.4*  --  8.4* 8.2*   < > = values in this interval not displayed.   GFR Estimated Creatinine Clearance: 71.8 mL/min (by C-G formula based on SCr of 1.24 mg/dL). Liver Function Tests: Recent Labs  Lab 04/04/2020 0358 04/14/20 0150  AST 26 24  ALT 10 9  ALKPHOS 80 69  BILITOT 0.3 0.3  PROT 7.9 7.2  ALBUMIN 2.7* 2.3*   Recent Labs  Lab 04/19/2020 0358  LIPASE 20   No results for input(s): AMMONIA in the last 168 hours. Coagulation profile Recent Labs  Lab 04/20/2020 0358 05/01/2020 1448  INR 1.0 1.0    CBC: Recent Labs  Lab 04/09/2020 0358 04/18/2020 0358 04/04/2020 1956 04/21/2020 1956 04/14/20 0150 04/15/20 0014 04/15/20 0318 04/16/20 0437 04/13/2020 0342  WBC 6.7   < > 5.0   < > 4.6 4.7 4.6 6.4 8.9  NEUTROABS 5.3  --  3.8  --   --   --  3.1  --  7.2  HGB 7.9*   < > 6.9*   < > 7.5* 8.0* 8.3* 8.5* 8.3*  HCT 27.1*   < > 23.3*   < > 25.4* 27.0* 27.9* 29.0* 27.9*  MCV 76.1*   < > 74.9*   < > 74.9* 76.7* 76.6* 77.7* 77.3*  PLT 352   < > 323   < > 349 352 355 366 402*   < > = values in this interval not displayed.   Cardiac Enzymes: No results for input(s): CKTOTAL, CKMB, CKMBINDEX, TROPONINI in the last 168 hours. BNP (last 3 results) No results for input(s): PROBNP in the last 8760 hours. CBG: Recent Labs  Lab 04/16/20 1206 04/16/20 1703 04/16/20 2107 04/18/2020 0800 04/29/2020 1314  GLUCAP 131* 169* 102* 171* 170*   D-Dimer: No results for input(s): DDIMER in the last 72 hours. Hgb  A1c: No results for input(s): HGBA1C in the last 72 hours. Lipid Profile: Recent Labs    04/16/20 0437  CHOL 170  HDL 47  LDLCALC 88  TRIG 175*  CHOLHDL 3.6   Thyroid function studies: Recent Labs    04/16/20 0437  TSH 7.730*   Anemia work up: No results for input(s): VITAMINB12, FOLATE, FERRITIN, TIBC, IRON, RETICCTPCT in the last 72 hours. Sepsis Labs: Recent Labs  Lab 04/12/2020 0358 04/20/2020 0358 05/03/2020 0641 04/23/2020 1956 04/14/20 0150 04/15/20 0014 04/15/20 0318 04/16/20 0437 04/28/2020 0342 04/06/2020 1448  PROCALCITON  --   --   --   --   --   --   --   --   --  3.14  WBC 6.7   < >  --  5.0   < > 4.7 4.6 6.4 8.9  --   LATICACIDVEN 2.4*  --  2.5* 1.4  --   --   --   --   --  3.1*   < > = values in this interval not displayed.    Microbiology Recent Results (from the past 240 hour(s))  Blood culture (routine single)     Status: None (Preliminary result)   Collection Time: 05/01/2020  3:58 AM   Specimen: BLOOD  Result Value Ref Range Status   Specimen Description BLOOD BLOOD RIGHT FOREARM  Final   Special Requests   Final    BOTTLES DRAWN AEROBIC AND ANAEROBIC Blood Culture results may not be optimal due to an excessive volume of blood received in culture bottles   Culture   Final    NO GROWTH 4 DAYS Performed at [facility], [address], [place], [place] [address]    Report Status PENDING  Incomplete  Respiratory Panel by RT PCR (Flu A&B, Covid) - Nasopharyngeal Swab     Status: None   Collection Time: 04/26/2020  3:58 AM   Specimen: Nasopharyngeal Swab  Result Value Ref Range Status   SARS Coronavirus 2 by RT PCR NEGATIVE NEGATIVE Final    Comment: (NOTE) SARS-CoV-2 target nucleic acids are NOT DETECTED.  The SARS-CoV-2 RNA is generally detectable in upper respiratoy specimens during the acute phase of infection. The lowest concentration of SARS-CoV-2 viral copies this assay can detect is 131 copies/mL. A negative result does not preclude  SARS-Cov-2 infection and should not be used as the sole basis for treatment or other patient management decisions. A negative result may occur with  improper specimen collection/handling, submission of specimen other than nasopharyngeal swab, presence of viral mutation(s) within the areas targeted by this assay, and inadequate number of viral copies (<131 copies/mL). A negative result must be combined with clinical observations, patient history, and epidemiological information. The expected result is Negative.  Fact Sheet for Patients:  PinkCheek.be  Fact Sheet for Healthcare Providers:  GravelBags.it  This test is no t yet approved or cleared by the [place] FDA and  has been authorized for detection and/or diagnosis of SARS-CoV-2 by FDA under an Emergency Use Authorization (EUA). This EUA will remain  in effect (meaning this test can be used) for the duration of the COVID-19 declaration under Section 564(b)(1) of the Act, 21 U.S.C. section 360bbb-3(b)(1), unless the authorization is terminated or revoked sooner.     Influenza A by PCR NEGATIVE NEGATIVE Final   Influenza B by PCR NEGATIVE NEGATIVE Final    Comment: (NOTE) The Xpert Xpress SARS-CoV-2/FLU/RSV assay is intended as an aid in  the diagnosis of influenza from Nasopharyngeal swab specimens and  should not be used as a sole basis for treatment. Nasal washings and  aspirates are unacceptable for Xpert Xpress SARS-CoV-2/FLU/RSV  testing.  Fact Sheet for Patients: PinkCheek.be  Fact Sheet for Healthcare Providers: GravelBags.it  This test is not yet approved or cleared by the [place] FDA and  has been authorized for detection and/or diagnosis of SARS-CoV-2 by  FDA under an Emergency Use Authorization (EUA). This EUA will remain  in effect (meaning this test can be used) for the duration of the    Covid-19 declaration under Section 564(b)(1) of the Act, 21  U.S.C. section 360bbb-3(b)(1), unless the authorization is  terminated or revoked. Performed at [facility], [address] [place],  [address]   Culture, blood (single)     Status: None (Preliminary result)   Collection Time: 04/05/2020  1:36 PM   Specimen:  BLOOD  Result Value Ref Range Status   Specimen Description BLOOD LEFT AC  Final   Special Requests   Final    BOTTLES DRAWN AEROBIC ONLY Blood Culture adequate volume   Culture   Final    NO GROWTH 4 DAYS Performed at [facility], [address] [place], [place] [address]    Report Status PENDING  Incomplete  Urine culture     Status: None   Collection Time: 04/15/2020  3:10 PM   Specimen: Urine, Random  Result Value Ref Range Status   Specimen Description   Final    URINE, RANDOM Performed at [facility], [address], [place], [place] [address]    Special Requests   Final    NONE Performed at [facility], [address] [place], [place] [address]    Culture   Final    NO GROWTH Performed at [facility], [place] [address] [place], [place] [address]    Report Status 04/14/2020 FINAL  Final    Procedures and diagnostic studies:  US Carotid Bilateral  Result Date: 04/16/2020 CLINICAL DATA:  Stroke symptoms, altered mental status, diabetes EXAM: BILATERAL CAROTID DUPLEX ULTRASOUND TECHNIQUE: Pearline Cables scale imaging, color Doppler and duplex ultrasound were performed of bilateral carotid and vertebral arteries in the neck. COMPARISON:  None. FINDINGS: Criteria: Quantification of carotid stenosis is based on velocity parameters that correlate the residual internal carotid diameter with NASCET-based stenosis levels, using the diameter of the distal internal carotid lumen as the denominator for stenosis measurement. The following velocity measurements were obtained: RIGHT ICA: 101/20 cm/sec CCA: 42/87 cm/sec  SYSTOLIC ICA/CCA RATIO:  1.1 ECA: 54 cm/sec LEFT ICA: 92/30 cm/sec CCA: 681/15 cm/sec SYSTOLIC ICA/CCA RATIO:  0.9 ECA: 64 cm/sec RIGHT CAROTID ARTERY: No significant carotid atherosclerosis. No hemodynamically significant ICA stenosis, velocity elevation, or turbulent flow. Negative for stenosis. Normal for age. RIGHT VERTEBRAL ARTERY:  Normal antegrade flow LEFT CAROTID ARTERY: Very minimal bifurcation intimal thickening and early atherosclerotic change. No hemodynamically significant left ICA stenosis, velocity elevation, or turbulent flow. Degree of narrowing less than 50% by ultrasound criteria. LEFT VERTEBRAL ARTERY:  Normal antegrade flow IMPRESSION: No significant right ICA atherosclerosis. Negative for right ICA stenosis. Minor left carotid atherosclerosis. Degree of narrowing less than 50% by ultrasound criteria. Normal antegrade vertebral flow bilaterally Electronically Signed   By: Jerilynn Mages.  Shick M.D.   On: 04/16/2020 07:58   PERIPHERAL VASCULAR CATHETERIZATION  Result Date: 04/08/2020 See op note              LOS: 4 days   Shaolin Armas  [facility]   Pager on [email or web address]. If 7PM-7AM, please contact night-coverage at [email or web address]     04/23/2020, 4:31 PM

## 2020-04-18 ENCOUNTER — Encounter: Payer: Self-pay | Admitting: Vascular Surgery

## 2020-04-18 DIAGNOSIS — J189 Pneumonia, unspecified organism: Secondary | ICD-10-CM | POA: Diagnosis not present

## 2020-04-18 DIAGNOSIS — I6381 Other cerebral infarction due to occlusion or stenosis of small artery: Secondary | ICD-10-CM | POA: Diagnosis not present

## 2020-04-18 DIAGNOSIS — D638 Anemia in other chronic diseases classified elsewhere: Secondary | ICD-10-CM | POA: Diagnosis not present

## 2020-04-18 DIAGNOSIS — C641 Malignant neoplasm of right kidney, except renal pelvis: Secondary | ICD-10-CM | POA: Diagnosis not present

## 2020-04-18 DIAGNOSIS — G9341 Metabolic encephalopathy: Secondary | ICD-10-CM | POA: Diagnosis not present

## 2020-04-18 DIAGNOSIS — M549 Dorsalgia, unspecified: Secondary | ICD-10-CM | POA: Diagnosis not present

## 2020-04-18 LAB — URINALYSIS, ROUTINE W REFLEX MICROSCOPIC
Bacteria, UA: NONE SEEN
Bilirubin Urine: NEGATIVE
Glucose, UA: NEGATIVE mg/dL
Ketones, ur: 5 mg/dL — AB
Leukocytes,Ua: NEGATIVE
Nitrite: NEGATIVE
Protein, ur: 30 mg/dL — AB
Specific Gravity, Urine: 1.039 — ABNORMAL HIGH (ref 1.005–1.030)
pH: 5 (ref 5.0–8.0)

## 2020-04-18 LAB — GLUCOSE, CAPILLARY
Glucose-Capillary: 176 mg/dL — ABNORMAL HIGH (ref 70–99)
Glucose-Capillary: 162 mg/dL — ABNORMAL HIGH (ref 70–99)

## 2020-04-18 LAB — CBC WITH DIFFERENTIAL/PLATELET
Abs Immature Granulocytes: 0.1 10*3/uL — ABNORMAL HIGH (ref 0.00–0.07)
Basophils Absolute: 0 10*3/uL (ref 0.0–0.1)
Basophils Relative: 0 %
Eosinophils Absolute: 0.1 10*3/uL (ref 0.0–0.5)
Eosinophils Relative: 1 %
HCT: 24.2 % — ABNORMAL LOW (ref 39.0–52.0)
Hemoglobin: 7.1 g/dL — ABNORMAL LOW (ref 13.0–17.0)
Immature Granulocytes: 1 %
Lymphocytes Relative: 5 %
Lymphs Abs: 0.6 10*3/uL — ABNORMAL LOW (ref 0.7–4.0)
MCH: 23.3 pg — ABNORMAL LOW (ref 26.0–34.0)
MCHC: 29.3 g/dL — ABNORMAL LOW (ref 30.0–36.0)
MCV: 79.3 fL — ABNORMAL LOW (ref 80.0–100.0)
Monocytes Absolute: 0.6 10*3/uL (ref 0.1–1.0)
Monocytes Relative: 5 %
Neutro Abs: 11.2 10*3/uL — ABNORMAL HIGH (ref 1.7–7.7)
Neutrophils Relative %: 88 %
Platelets: 391 10*3/uL (ref 150–400)
RBC: 3.05 MIL/uL — ABNORMAL LOW (ref 4.22–5.81)
RDW: 20.3 % — ABNORMAL HIGH (ref 11.5–15.5)
WBC: 12.6 10*3/uL — ABNORMAL HIGH (ref 4.0–10.5)
nRBC: 0.2 % (ref 0.0–0.2)

## 2020-04-18 LAB — COMPREHENSIVE METABOLIC PANEL
ALT: 10 U/L (ref 0–44)
AST: 24 U/L (ref 15–41)
Albumin: 2.3 g/dL — ABNORMAL LOW (ref 3.5–5.0)
Alkaline Phosphatase: 80 U/L (ref 38–126)
Anion gap: 14 (ref 5–15)
BUN: 27 mg/dL — ABNORMAL HIGH (ref 8–23)
CO2: 19 mmol/L — ABNORMAL LOW (ref 22–32)
Calcium: 8.6 mg/dL — ABNORMAL LOW (ref 8.9–10.3)
Chloride: 107 mmol/L (ref 98–111)
Creatinine, Ser: 2.72 mg/dL — ABNORMAL HIGH (ref 0.61–1.24)
GFR, Estimated: 24 mL/min — ABNORMAL LOW (ref >60)
Glucose, Bld: 204 mg/dL — ABNORMAL HIGH (ref 70–99)
Potassium: 4.8 mmol/L (ref 3.5–5.1)
Sodium: 140 mmol/L (ref 135–145)
Total Bilirubin: 0.7 mg/dL (ref 0.3–1.2)
Total Protein: 7.4 g/dL (ref 6.5–8.1)

## 2020-04-18 LAB — MRSA PCR SCREENING: MRSA by PCR: NEGATIVE

## 2020-04-18 LAB — CULTURE, BLOOD (SINGLE): Culture: NO GROWTH

## 2020-04-18 LAB — LACTIC ACID, PLASMA: Lactic Acid, Venous: 1.6 mmol/L (ref 0.5–1.9)

## 2020-04-18 LAB — PROCALCITONIN: Procalcitonin: 4.05 ng/mL

## 2020-04-18 LAB — PHOSPHORUS: Phosphorus: 5.3 mg/dL — ABNORMAL HIGH (ref 2.5–4.6)

## 2020-04-18 LAB — MAGNESIUM: Magnesium: 2.1 mg/dL (ref 1.7–2.4)

## 2020-04-18 MED ORDER — SODIUM CHLORIDE 0.9 % IV SOLN
2.0000 g | Freq: Two times a day (BID) | INTRAVENOUS | Status: DC
  Filled 2020-04-18: qty 2

## 2020-04-18 MED ORDER — VANCOMYCIN HCL 1250 MG/250ML IV SOLN: 1250.0000 mg | INTRAVENOUS | Status: DC

## 2020-04-18 NOTE — Care Management Important Message (Signed)
Important Message  Patient Details  Name: Hunter Davila MRN: [id number] Date of Birth: January 18, 1956   Medicare Important Message Given:  Yes     Dannette Barbara 04/18/2020, 10:18 AM

## 2020-04-18 NOTE — Progress Notes (Signed)
Subjective: Patient quite lethargic this morning but does not appear to be in pain.  Discussion had with POA at length at the end of the day on yesterday.  Testing from yesterday not particularly revealing and at this point she would like [place] to focus on pain control and palliative care.  She is to contact his oncologist today.    Objective: Current vital signs: BP 95/72 (BP Location: Left Arm)    Pulse (!) 109    Temp 98.6 F (37 C) (Axillary)    Resp 17    Ht 6\' 2"  (1.88 m)    Wt 91.2 kg    SpO2 96%    BMI 25.81 kg/m  Vital signs in last 24 hours: Temp:  [97 F (36.1 C)-99.3 F (37.4 C)] 98.6 F (37 C) (10/15 0758) Pulse Rate:  [109-137] 109 (10/15 0758) Resp:  [14-24] 17 (10/15 0758) BP: (92-120)/(67-86) 95/72 (10/15 0758) SpO2:  [94 %-100 %] 96 % (10/15 0758) Weight:  [91.2 kg] 91.2 kg (10/14 1132)  Intake/Output from previous day: 10/14 0701 - 10/15 0700 In: 178.6 [I.V.:78.5; IV Piggyback:100.1] Out: 550 [Urine:550] Intake/Output this shift: No intake/output data recorded. Nutritional status:  Diet Order            Diet NPO time specified  Diet effective now                 Neurologic Exam: Mental Status: Lethargic but able to be alerted with stimulation.  Does not speak.  Follows some simple commands.   Cranial Nerves: II: Blinks to bilateral confrontation III,IV, VI: left ptosis Motor: Grips my hand weakly bilaterally.  No attempt to move lower extremities Sensory: Does not respond to noxious stimuli in the lower extremities  Lab Results: Basic Metabolic Panel: Recent Labs  Lab 04/18/2020 0358 04/08/2020 0358 04/14/20 0150 04/16/20 0437 04/18/20 0509  NA 135  --  141 140 140  K 4.5  --  4.0 3.8 4.8  CL 101  --  106 105 107  CO2 20*  --  24 25 19*  GLUCOSE 167*  --  108* 170* 204*  BUN 12  --  10 10 27*  CREATININE 0.99  --  0.99 1.24 2.72*  CALCIUM 8.4*   < > 8.4* 8.2* 8.6*  MG  --   --   --   --  2.1  PHOS  --   --   --   --  5.3*   < > = values in  this interval not displayed.    Liver Function Tests: Recent Labs  Lab 04/11/2020 0358 04/14/20 0150 04/18/20 0509  AST 26 24 24   ALT 10 9 10   ALKPHOS 80 69 80  BILITOT 0.3 0.3 0.7  PROT 7.9 7.2 7.4  ALBUMIN 2.7* 2.3* 2.3*   Recent Labs  Lab 04/04/2020 0358  LIPASE 20   No results for input(s): AMMONIA in the last 168 hours.  CBC: Recent Labs  Lab 04/06/2020 0358 04/10/2020 0358 04/07/2020 1956 04/14/20 0150 04/15/20 0014 04/15/20 0318 04/16/20 0437 04/08/2020 0342 04/18/20 0509  WBC 6.7   < > 5.0   < > 4.7 4.6 6.4 8.9 12.6*  NEUTROABS 5.3  --  3.8  --   --  3.1  --  7.2 11.2*  HGB 7.9*   < > 6.9*   < > 8.0* 8.3* 8.5* 8.3* 7.1*  HCT 27.1*   < > 23.3*   < > 27.0* 27.9* 29.0* 27.9* 24.2*  MCV 76.1*   < >  74.9*   < > 76.7* 76.6* 77.7* 77.3* 79.3*  PLT 352   < > 323   < > 352 355 366 402* 391   < > = values in this interval not displayed.    Cardiac Enzymes: No results for input(s): CKTOTAL, CKMB, CKMBINDEX, TROPONINI in the last 168 hours.  Lipid Panel: Recent Labs  Lab 04/16/20 0437  CHOL 170  TRIG 175*  HDL 47  CHOLHDL 3.6  VLDL 35  LDLCALC 88    CBG: Recent Labs  Lab 04/20/2020 0800 04/13/2020 1314 05/04/2020 1700 04/06/2020 2114 04/18/20 0757  GLUCAP 171* 170* 183* 164* 176*    Microbiology: Results for orders placed or performed during the hospital encounter of 04/28/2020  Blood culture (routine single)     Status: None   Collection Time: 05/04/2020  3:58 AM   Specimen: BLOOD  Result Value Ref Range Status   Specimen Description BLOOD BLOOD RIGHT FOREARM  Final   Special Requests   Final    BOTTLES DRAWN AEROBIC AND ANAEROBIC Blood Culture results may not be optimal due to an excessive volume of blood received in culture bottles   Culture   Final    NO GROWTH 5 DAYS Performed at [facility], [address] [place], [place] [address]    Report Status 04/18/2020 FINAL  Final  Respiratory Panel by RT PCR (Flu A&B, Covid) - Nasopharyngeal Swab      Status: None   Collection Time: 04/12/2020  3:58 AM   Specimen: Nasopharyngeal Swab  Result Value Ref Range Status   SARS Coronavirus 2 by RT PCR NEGATIVE NEGATIVE Final    Comment: (NOTE) SARS-CoV-2 target nucleic acids are NOT DETECTED.  The SARS-CoV-2 RNA is generally detectable in upper respiratoy specimens during the acute phase of infection. The lowest concentration of SARS-CoV-2 viral copies this assay can detect is 131 copies/mL. A negative result does not preclude SARS-Cov-2 infection and should not be used as the sole basis for treatment or other patient management decisions. A negative result may occur with  improper specimen collection/handling, submission of specimen other than nasopharyngeal swab, presence of viral mutation(s) within the areas targeted by this assay, and inadequate number of viral copies (<131 copies/mL). A negative result must be combined with clinical observations, patient history, and epidemiological information. The expected result is Negative.  Fact Sheet for Patients:  PinkCheek.be  Fact Sheet for Healthcare Providers:  GravelBags.it  This test is no t yet approved or cleared by the [place] FDA and  has been authorized for detection and/or diagnosis of SARS-CoV-2 by FDA under an Emergency Use Authorization (EUA). This EUA will remain  in effect (meaning this test can be used) for the duration of the COVID-19 declaration under Section 564(b)(1) of the Act, 21 U.S.C. section 360bbb-3(b)(1), unless the authorization is terminated or revoked sooner.     Influenza A by PCR NEGATIVE NEGATIVE Final   Influenza B by PCR NEGATIVE NEGATIVE Final    Comment: (NOTE) The Xpert Xpress SARS-CoV-2/FLU/RSV assay is intended as an aid in  the diagnosis of influenza from Nasopharyngeal swab specimens and  should not be used as a sole basis for treatment. Nasal washings and  aspirates are  unacceptable for Xpert Xpress SARS-CoV-2/FLU/RSV  testing.  Fact Sheet for Patients: PinkCheek.be  Fact Sheet for Healthcare Providers: GravelBags.it  This test is not yet approved or cleared by the [place] FDA and  has been authorized for detection and/or diagnosis of SARS-CoV-2 by  FDA  under an Emergency Use Authorization (EUA). This EUA will remain  in effect (meaning this test can be used) for the duration of the  Covid-19 declaration under Section 564(b)(1) of the Act, 21  U.S.C. section 360bbb-3(b)(1), unless the authorization is  terminated or revoked. Performed at [facility], [address] [place], [place] [address]   Culture, blood (single)     Status: None (Preliminary result)   Collection Time: 04/15/2020  1:36 PM   Specimen: BLOOD  Result Value Ref Range Status   Specimen Description BLOOD LEFT AC  Final   Special Requests   Final    BOTTLES DRAWN AEROBIC ONLY Blood Culture adequate volume   Culture  Setup Time   Final    AEROBIC BOTTLE ONLY GRAM POSITIVE RODS CRITICAL RESULT CALLED TO, READ BACK BY AND VERIFIED WITH: LISA KLUTTZ 04/11/2020 AT 1956 BY ACR Performed at [facility], [address], [place], [place] [address]    Culture GRAM POSITIVE RODS  Final   Report Status PENDING  Incomplete  Urine culture     Status: None   Collection Time: 04/29/2020  3:10 PM   Specimen: Urine, Random  Result Value Ref Range Status   Specimen Description   Final    URINE, RANDOM Performed at [facility], [address] [place], [place] [address]    Special Requests   Final    NONE Performed at [facility], [address], [place], [place] [address]    Culture   Final    NO GROWTH Performed at [facility], [place] [address] [place], [place] [address]    Report Status 04/14/2020 FINAL  Final  CULTURE, BLOOD (ROUTINE X 2) w Reflex to ID Panel     Status: None  (Preliminary result)   Collection Time: 04/13/2020  2:48 PM   Specimen: BLOOD  Result Value Ref Range Status   Specimen Description BLOOD RIGHT ANTECUBITAL  Final   Special Requests   Final    BOTTLES DRAWN AEROBIC AND ANAEROBIC Blood Culture adequate volume   Culture   Final    NO GROWTH < 24 HOURS Performed at [facility], [address], [place], [place] [address]    Report Status PENDING  Incomplete  CULTURE, BLOOD (ROUTINE X 2) w Reflex to ID Panel     Status: None (Preliminary result)   Collection Time: 04/21/2020  2:48 PM   Specimen: BLOOD  Result Value Ref Range Status   Specimen Description BLOOD BLOOD RIGHT HAND  Final   Special Requests   Final    BOTTLES DRAWN AEROBIC AND ANAEROBIC Blood Culture adequate volume   Culture   Final    NO GROWTH < 24 HOURS Performed at [facility], [address], [place],  [address]    Report Status PENDING  Incomplete    Coagulation Studies: Recent Labs    04/28/2020 1448  LABPROT 13.1  INR 1.0    Imaging: CT THORACIC SPINE WO CONTRAST  Result Date: 04/16/2020 CLINICAL DATA:  64 year old male with mid and lower back pain. Progressive neurologic deficit. Altered mental status. Metastatic renal cell carcinoma. EXAM: CT THORACIC SPINE WITHOUT CONTRAST TECHNIQUE: Multidetector CT images of the thoracic were obtained using the standard protocol without intravenous contrast. COMPARISON:  Chest CT 04/28/2020. FINDINGS: Study is mildly degraded by motion artifact despite repeated imaging attempts, especially in the upper thoracic levels. Limited cervical spine imaging: Cervicothoracic junction alignment is within normal limits. Thoracic spine segmentation:  Normal. Alignment: Stable, relatively normal thoracic vertebral height and alignment. No spondylolisthesis. Vertebrae: Motion artifact obscures the T2-T3 endplates, which were normal on the recent chest CT. Osteopenia but otherwise normal bone mineralization  throughout the thoracic vertebrae above T12. At T12 there is a right lateral lytic osseous lesion measuring about 23 mm diameter (series 3, image 151). No involvement of the spinal canal or right T12 foramen. No other thoracic vertebral metastasis is evident by CT. Visible posterior ribs appear grossly intact. Paraspinal and other soft tissues: Bilateral pleural effusions appear increased since 04/10/2020, that on the left is now large. Otherwise stable visible thoracic and upper abdominal viscera. Disc levels: Mild for age thoracic spine degeneration. No CT evidence of thoracic spinal stenosis. IMPRESSION: 1. 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 No pathologic fracture. No involvement of the spinal canal or right T12 foramen. 2. No other thoracic vertebral metastasis is evident by CT. 3. Bilateral pleural effusions appear increased since 04/06/2020, now large on the left. Electronically Signed   By: Genevie Ann M.D.   On: 05/01/2020 17:13   CT LUMBAR SPINE WO CONTRAST  Result Date: 05/04/2020 CLINICAL DATA:  64 year old male with mid and lower back pain. Progressive neurologic deficit. Altered mental status. Metastatic renal cell carcinoma. EXAM: CT LUMBAR SPINE WITHOUT CONTRAST TECHNIQUE: Multidetector CT imaging of the lumbar spine was performed without intravenous contrast administration. Multiplanar CT image reconstructions were also generated. COMPARISON:  Thoracic spine CT today. FINDINGS: Segmentation: Normal. This is concordant with the thoracic numbering today. Alignment: Mild motion artifact at the L5-S1 level. Normal lumbar vertebral height and alignment. Vertebrae: Lytic metastasis of the right lateral T12 body reported separately today. Osteopenia. No lumbar vertebral metastasis evident by CT. No metastasis in the visible sacrum. Paraspinal and other soft tissues: Partially visible very large right renal tumor (series 5, image 52) with bulky retroperitoneal nodal metastases  with extracapsular extension (same image). Partially visible abnormal right adrenal gland. Partially visible nonspecific retroperitoneal and presacral stranding in the pelvis greater on the right. Disc levels: Lumbar spine degeneration is concordant with age. Mild disc bulging and endplate spurring with no significant lumbar spinal stenosis. IMPRESSION: 1. No lumbar metastasis evident by CT. 2. No lumbar acute osseous abnormality. Age-appropriate lumbar spine degeneration. 3. Very large right renal tumor, adrenal and retroperitoneal nodal metastases with extracapsular extension are partially visible. Electronically Signed   By: Genevie Ann M.D.   On: 05/01/2020 17:18   PERIPHERAL VASCULAR CATHETERIZATION  Result Date: 05/01/2020 See op note  DG Chest Port 1 View  Result Date: 04/06/2020 CLINICAL DATA:  Right-sided pain EXAM: PORTABLE CHEST 1 VIEW COMPARISON:  04/21/2020, CT of the thoracic spine from earlier in the same day. FINDINGS: Cardiac shadow is stable. Left-sided pleural effusion is noted with both a basilar component and loculated superolateral component similar to that noted on prior CT from earlier in the same day. The overall inspiratory effort is poor. Left basilar atelectatic changes are noted. IMPRESSION: Increase in left-sided pleural effusion with a loculated component noted superiorly similar to that seen on recent CT of the thoracic spine. Overall poor inspiratory effort with left basilar atelectasis and left basilar effusion. Electronically Signed   By: Inez Catalina M.D.   On: 04/14/2020 19:14    Medications:  I have reviewed the patient's current medications. Scheduled:  aspirin EC  81 mg Oral Daily   baclofen  10 mg Oral TID   feeding supplement  237 mL Oral TID BM  insulin aspart  0-15 Units Subcutaneous TID WC   levothyroxine  150 mcg Oral Daily   oxyCODONE  20 mg Oral Q12H   pantoprazole (PROTONIX) IV  40 mg Intravenous Q12H   QUEtiapine  50 mg Oral QHS   senna   2 tablet Oral BID   tamsulosin  0.4 mg Oral Daily    Assessment/Plan: 64 y.o.malewith medical history significant forrenal cell carcinoma on oral chemotherapy, hypothyroidism, diabetes mellitus, hypertension and GERD who was brought into the ER by EMS for evaluation of mental status changes. His mental status has been gradually getting worse over the last few months but acutely became worse on 10/9. At this point seems to have worsened further since admission.   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 There are areas in the left periatrial white matter and occipital lobe that may correspond to previous findings but do not have these images for comparison.  The size of the acute infarct is very small and not likely in and of itself the etiology of the mental status changes although it may be contributing along with his increased chemotherapy dose and thyroid abnormalities. Etiology unclear but may be of embolic etiology. Hypercoagulability related to cancer a possibility as well.  Carotid dopplers show no evidence of hemodynamically significant stenosis.  Echocardiogram shows no cardiac source of emboli with an EF of 55-60%.  A1c 7.3, LDL 88.  TSH elevated. Patient also on presentation with some back and abdominal pain.  Was scheduled to have MRI of the lumbar and thoracic spines.  On yesterday also with exam concerning for lower extremity thrombosis.  Arteriogram showed no evidence of thrombosis.  CT of the thoracic and lumbar spines performed as well and was only significant for known T12 metastatic lesion with no evidence of cord compromise.   At this point long conversation had with POA.  It was determined that focus would be on pain control from this point forward.  Requested involvement of palliative care, SW and case worker.  Dr. Mal Misty made aware.    Recommendations: 1. Agree with pain control   No further  neurologic intervention is recommended at this time.  If further questions arise, please call or page at that time.  Thank you for allowing neurology to participate in the care of this patient.    LOS: 5 days   Alexis Goodell, MD Neurology  04/18/2020  10:44 AM

## 2020-04-18 NOTE — Progress Notes (Signed)
Hunter Antigua, MD [address] [place], [place], [place], [address] [address], [place], [place], [place], [address] Phone: [phone]  Fax: [phone]   Subjective:  Patient lethargic today and not answering any questions.  No evidence of active bleeding  Objective: Exam: Vital signs in last 24 hours: Vitals:   04/18/20 0104 04/18/20 0410 04/18/20 0758 04/18/20 1126  BP: 92/69 93/68 95/72  100/74  Pulse: (!) 115 (!) 113 (!) 109 (!) 105  Resp: 16 15 17 18   Temp: 99 F (37.2 C) 98 F (36.7 C) 98.6 F (37 C) 98.4 F (36.9 C)  TempSrc: Axillary Oral Axillary Axillary  SpO2: 97% 97% 96% 98%  Weight:      Height:       Weight change:   Intake/Output Summary (Last 24 hours) at 04/18/2020 1758 Last data filed at 04/18/2020 0451 Gross per 24 hour  Intake 178.58 ml  Output 200 ml  Net -21.42 ml    General: No acute distress, AAO x3 Abd: Soft, NT/ND, No HSM Skin: Warm, no rashes Neck: Supple, Trachea midline   Lab Results: Lab Results  Component Value Date   WBC 12.6 (H) 04/18/2020   HGB 7.1 (L) 04/18/2020   HCT 24.2 (L) 04/18/2020   MCV 79.3 (L) 04/18/2020   PLT 391 04/18/2020   Micro Results: Recent Results (from the past 240 hour(s))  Blood culture (routine single)     Status: None   Collection Time: 04/09/2020  3:58 AM   Specimen: BLOOD  Result Value Ref Range Status   Specimen Description BLOOD BLOOD RIGHT FOREARM  Final   Special Requests   Final    BOTTLES DRAWN AEROBIC AND ANAEROBIC Blood Culture results may not be optimal due to an excessive volume of blood received in culture bottles   Culture   Final    NO GROWTH 5 DAYS Performed at [facility], [address] [place], [place] [address]    Report Status 04/18/2020 FINAL  Final  Respiratory Panel by RT PCR (Flu A&B, Covid) - Nasopharyngeal Swab     Status: None   Collection Time: 04/08/2020  3:58 AM   Specimen: Nasopharyngeal Swab  Result Value Ref Range Status   SARS  Coronavirus 2 by RT PCR NEGATIVE NEGATIVE Final    Comment: (NOTE) SARS-CoV-2 target nucleic acids are NOT DETECTED.  The SARS-CoV-2 RNA is generally detectable in upper respiratoy specimens during the acute phase of infection. The lowest concentration of SARS-CoV-2 viral copies this assay can detect is 131 copies/mL. A negative result does not preclude SARS-Cov-2 infection and should not be used as the sole basis for treatment or other patient management decisions. A negative result may occur with  improper specimen collection/handling, submission of specimen other than nasopharyngeal swab, presence of viral mutation(s) within the areas targeted by this assay, and inadequate number of viral copies (<131 copies/mL). A negative result must be combined with clinical observations, patient history, and epidemiological information. The expected result is Negative.  Fact Sheet for Patients:  PinkCheek.be  Fact Sheet for Healthcare Providers:  GravelBags.it  This test is no t yet approved or cleared by the [place] FDA and  has been authorized for detection and/or diagnosis of SARS-CoV-2 by FDA under an Emergency Use Authorization (EUA). This EUA will remain  in effect (meaning this test can be used) for the duration of the COVID-19 declaration under Section 564(b)(1) of the Act, 21 U.S.C. section 360bbb-3(b)(1), unless the authorization is terminated or revoked sooner.  Influenza A by PCR NEGATIVE NEGATIVE Final   Influenza B by PCR NEGATIVE NEGATIVE Final    Comment: (NOTE) The Xpert Xpress SARS-CoV-2/FLU/RSV assay is intended as an aid in  the diagnosis of influenza from Nasopharyngeal swab specimens and  should not be used as a sole basis for treatment. Nasal washings and  aspirates are unacceptable for Xpert Xpress SARS-CoV-2/FLU/RSV  testing.  Fact Sheet for  Patients: PinkCheek.be  Fact Sheet for Healthcare Providers: GravelBags.it  This test is not yet approved or cleared by the [place] FDA and  has been authorized for detection and/or diagnosis of SARS-CoV-2 by  FDA under an Emergency Use Authorization (EUA). This EUA will remain  in effect (meaning this test can be used) for the duration of the  Covid-19 declaration under Section 564(b)(1) of the Act, 21  U.S.C. section 360bbb-3(b)(1), unless the authorization is  terminated or revoked. Performed at [facility], [address] [place], [place] [address]   Culture, blood (single)     Status: None (Preliminary result)   Collection Time: 04/21/2020  1:36 PM   Specimen: BLOOD  Result Value Ref Range Status   Specimen Description BLOOD LEFT AC  Final   Special Requests   Final    BOTTLES DRAWN AEROBIC ONLY Blood Culture adequate volume   Culture  Setup Time   Final    AEROBIC BOTTLE ONLY GRAM POSITIVE RODS CRITICAL RESULT CALLED TO, READ BACK BY AND VERIFIED WITH: LISA KLUTTZ 04/18/2020 AT 1956 BY ACR Performed at [facility], [address] [place], [place] [address]    Culture GRAM POSITIVE RODS  Final   Report Status PENDING  Incomplete  Urine culture     Status: None   Collection Time: 04/25/2020  3:10 PM   Specimen: Urine, Random  Result Value Ref Range Status   Specimen Description   Final    URINE, RANDOM Performed at [facility], [address] [place], [place] [address]    Special Requests   Final    NONE Performed at [facility], [address] [place], [place] [address]    Culture   Final    NO GROWTH Performed at [facility], [place] [address] [place], [place] [address]    Report Status 04/14/2020 FINAL  Final  CULTURE, BLOOD (ROUTINE X 2) w Reflex to ID Panel     Status: None (Preliminary result)   Collection Time: 04/26/2020  2:48 PM   Specimen: BLOOD   Result Value Ref Range Status   Specimen Description BLOOD RIGHT ANTECUBITAL  Final   Special Requests   Final    BOTTLES DRAWN AEROBIC AND ANAEROBIC Blood Culture adequate volume   Culture   Final    NO GROWTH < 24 HOURS Performed at [facility], [address], [place], [place] [address]    Report Status PENDING  Incomplete  CULTURE, BLOOD (ROUTINE X 2) w Reflex to ID Panel     Status: None (Preliminary result)   Collection Time: 04/21/2020  2:48 PM   Specimen: BLOOD  Result Value Ref Range Status   Specimen Description BLOOD BLOOD RIGHT HAND  Final   Special Requests   Final    BOTTLES DRAWN AEROBIC AND ANAEROBIC Blood Culture adequate volume   Culture   Final    NO GROWTH < 24 HOURS Performed at [facility], [address], [place],  [address]    Report Status PENDING  Incomplete  MRSA PCR  Screening     Status: None   Collection Time: 04/18/20 10:08 AM   Specimen: Nasopharyngeal  Result Value Ref Range Status   MRSA by PCR NEGATIVE NEGATIVE Final    Comment:        The GeneXpert MRSA Assay (FDA approved for NASAL specimens only), is one component of a comprehensive MRSA colonization surveillance program. It is not intended to diagnose MRSA infection nor to guide or monitor treatment for MRSA infections. Performed at [facility], [address] [place], [place] [address]    Studies/Results: CT THORACIC SPINE WO CONTRAST  Result Date: 04/12/2020 CLINICAL DATA:  64 year old male with mid and lower back pain. Progressive neurologic deficit. Altered mental status. Metastatic renal cell carcinoma. EXAM: CT THORACIC SPINE WITHOUT CONTRAST TECHNIQUE: Multidetector CT images of the thoracic were obtained using the standard protocol without intravenous contrast. COMPARISON:  Chest CT 04/05/2020. FINDINGS: Study is mildly degraded by motion artifact despite repeated imaging attempts, especially in the upper thoracic levels. Limited cervical  spine imaging: Cervicothoracic junction alignment is within normal limits. Thoracic spine segmentation:  Normal. Alignment: Stable, relatively normal thoracic vertebral height and alignment. No spondylolisthesis. Vertebrae: Motion artifact obscures the T2-T3 endplates, which were normal on the recent chest CT. Osteopenia but otherwise normal bone mineralization throughout the thoracic vertebrae above T12. At T12 there is a right lateral lytic osseous lesion measuring about 23 mm diameter (series 3, image 151). No involvement of the spinal canal or right T12 foramen. No other thoracic vertebral metastasis is evident by CT. Visible posterior ribs appear grossly intact. Paraspinal and other soft tissues: Bilateral pleural effusions appear increased since 04/21/2020, that on the left is now large. Otherwise stable visible thoracic and upper abdominal viscera. Disc levels: Mild for age thoracic spine degeneration. No CT evidence of thoracic spinal stenosis. IMPRESSION: 1. 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 No pathologic fracture. No involvement of the spinal canal or right T12 foramen. 2. No other thoracic vertebral metastasis is evident by CT. 3. Bilateral pleural effusions appear increased since 04/12/2020, now large on the left. Electronically Signed   By: Genevie Ann M.D.   On: 04/20/2020 17:13   CT LUMBAR SPINE WO CONTRAST  Result Date: 04/21/2020 CLINICAL DATA:  64 year old male with mid and lower back pain. Progressive neurologic deficit. Altered mental status. Metastatic renal cell carcinoma. EXAM: CT LUMBAR SPINE WITHOUT CONTRAST TECHNIQUE: Multidetector CT imaging of the lumbar spine was performed without intravenous contrast administration. Multiplanar CT image reconstructions were also generated. COMPARISON:  Thoracic spine CT today. FINDINGS: Segmentation: Normal. This is concordant with the thoracic numbering today. Alignment: Mild motion artifact at the L5-S1 level. Normal  lumbar vertebral height and alignment. Vertebrae: Lytic metastasis of the right lateral T12 body reported separately today. Osteopenia. No lumbar vertebral metastasis evident by CT. No metastasis in the visible sacrum. Paraspinal and other soft tissues: Partially visible very large right renal tumor (series 5, image 52) with bulky retroperitoneal nodal metastases with extracapsular extension (same image). Partially visible abnormal right adrenal gland. Partially visible nonspecific retroperitoneal and presacral stranding in the pelvis greater on the right. Disc levels: Lumbar spine degeneration is concordant with age. Mild disc bulging and endplate spurring with no significant lumbar spinal stenosis. IMPRESSION: 1. No lumbar metastasis evident by CT. 2. No lumbar acute osseous abnormality. Age-appropriate lumbar spine degeneration. 3. Very large right renal tumor, adrenal and retroperitoneal nodal metastases with extracapsular extension are partially visible. Electronically Signed   By: Lemmie Evens  [place] Crane M.D.   On: 05/01/2020 17:18   PERIPHERAL VASCULAR CATHETERIZATION  Result Date: 04/21/2020 See op note  DG Chest Port 1 View  Result Date: 04/14/2020 CLINICAL DATA:  Right-sided pain EXAM: PORTABLE CHEST 1 VIEW COMPARISON:  04/11/2020, CT of the thoracic spine from earlier in the same day. FINDINGS: Cardiac shadow is stable. Left-sided pleural effusion is noted with both a basilar component and loculated superolateral component similar to that noted on prior CT from earlier in the same day. The overall inspiratory effort is poor. Left basilar atelectatic changes are noted. IMPRESSION: Increase in left-sided pleural effusion with a loculated component noted superiorly similar to that seen on recent CT of the thoracic spine. Overall poor inspiratory effort with left basilar atelectasis and left basilar effusion. Electronically Signed   By: Inez Catalina M.D.   On: 04/23/2020 19:14   Medications:  Scheduled Meds: .  baclofen  10 mg Oral TID  . feeding supplement  237 mL Oral TID BM  . oxyCODONE  20 mg Oral Q12H  . QUEtiapine  50 mg Oral QHS  . senna  2 tablet Oral BID   Continuous Infusions: PRN Meds:.acetaminophen, haloperidol lactate, HYDROmorphone (DILAUDID) injection, HYDROmorphone (DILAUDID) injection, lactulose, ondansetron (ZOFRAN) IV, oxyCODONE-acetaminophen   Assessment: Principal Problem:   Acute metabolic encephalopathy Active Problems:   Renal cancer ([place])   Diabetes mellitus without complication (HCC)   Hypertension   GERD (gastroesophageal reflux disease)   Hypothyroidism   CAP (community acquired pneumonia)   Lactic acidosis   Anemia of chronic disease   Aspiration pneumonia (HCC)   Failure to thrive in adult   Iron deficiency anemia due to chronic blood loss   Severe protein-calorie malnutrition (HCC)   Severe back pain   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  Palliative care involvement is recommended and is pending  GI service will sign off at this time, please page with any questions or concerns   LOS: 5 days   Hunter Antigua, MD 04/18/2020, 5:58 PM

## 2020-04-18 NOTE — Plan of Care (Signed)

## 2020-04-18 NOTE — Progress Notes (Signed)
Pt has been lethargic most of the shift, he responds to voice and pain. He spoke briefly at the beginning of the shift and has been sleepy most of the time. MD, nursing supervisor and ICU charge nurse is aware. Pt MEWS is still red. Will continue to monitor.

## 2020-04-18 NOTE — Progress Notes (Signed)
Pharmacy Antibiotic Note  Hunter Davila is a 64 y.o. male with PMH significant for renal cell carcinoma on oral chemotherapy, hypothyroidism, DM, HTN, and GERD. Patient presented to ED c/o worsening R flank pain. Patient antibiotic course includes azithromycin and ceftriaxone>unasyn>PO augmentin. 10/10 CXR small L pleural effusion with adjacent airspace disease, atelectasis or infection. Pharmacy has been consulted for Cefepime and Vancomycin dosing for sepsis likely secondary to PNA.  10/10 Lactic acid 2.4>1.4; spiked to 3.1 on 10/14  Day 6 total abx  Scr: 0.99>>1.24>>2.72  Plan: Adjust vancomycin dose to 1250mg  IV q24h. MRSA PCR ordered. If negative, recommend discontinuing vancomycin   Adjust Cefepime dose to 2g IV q12 hours for CrCl <60ml/min  Monitor renal function and adjust doses as clinically indicated. Obtain vanc level prior to 4th or 5th dose  Height: 6\' 2"  (188 cm) Weight: 91.2 kg (201 lb 1 oz) IBW/kg (Calculated) : 82.2  Temp (24hrs), Avg:98.1 F (36.7 C), Min:96.8 F (36 C), Max:99.3 F (37.4 C)  Recent Labs  Lab 04/30/2020 0358 04/07/2020 0358 04/16/2020 0641 04/21/2020 1956 04/18/2020 1956 04/14/20 0150 04/14/20 0150 04/15/20 0014 04/15/20 0318 04/16/20 0437 04/25/2020 0342 04/30/2020 1448 04/18/20 0509  WBC 6.7   < >  --  5.0   < > 4.6   < > 4.7 4.6 6.4 8.9  --  12.6*  CREATININE 0.99  --   --   --   --  0.99  --   --   --  1.24  --   --  2.72*  LATICACIDVEN 2.4*  --  2.5* 1.4  --   --   --   --   --   --   --  3.1* 1.6   < > = values in this interval not displayed.    Estimated Creatinine Clearance: 32.7 mL/min (A) (by C-G formula based on SCr of 2.72 mg/dL (H)).    No Known Allergies  Antimicrobials this admission: 10/12 Augmentin >> 10/14 10/11 Unasyn >> 10/12 10/10 Azithromycin >> 10/12 10/14 Cefazolin x1 10/10 Ceftriaxone >> 10/11 10/14 Vancomycin >> 10/14 Cefepime >>  Microbiology results: 10/10 BCx: NGTD 10/14 Bcx: 1 of 4 GPR- Most likely  a contaminant  10/10 UCx: no growth  10/15 MRSA PCR: pending   Thank you for allowing pharmacy to be a part of this patient's care.  Pernell Dupre, PharmD, BCPS Clinical Pharmacist 04/18/2020 7:38 AM

## 2020-04-18 NOTE — Progress Notes (Signed)
Progress Note    Hunter Davila  NTZ:[id number] DOB: 1955-11-01  DOA: 04/29/2020 PCP: Patient, No Pcp Per      Brief Narrative:    Medical records reviewed and are as summarized below:  Hunter Davila is a 64 y.o. male with medical history significant for renal cell carcinoma on oral chemotherapy, hypothyroidism, diabetes mellitus, hypertension, GERD.  He was brought to the hospital because of right flank pain and altered mental status.  Reportedly, patient has had intermittent confusion and has been having worsening pain in the right flank and has also been having pain in the back.  He also reported difficulty walking, weakness in bilateral legs and had incontinence of urine.  He had also been dealing with insomnia.  MRI brain revealed acute to subacute left posterior frontal infarct.  There are also 2 lesions suspicious for metastatic disease in the brain.  Neurologist was consulted and dual antiplatelet therapy with aspirin and Plavix for 21 days followed by aspirin monotherapy was recommended.  He was treated with empiric antibiotics for left lower lobe aspiration pneumonia.  He was evaluated by oncologist for metastatic renal cell carcinoma.  There was some concern that chemotherapy was contributing to altered mental status.  He was previously on lenvatinib and axitinib but these have been discontinued because of their association with encephalopathy.  He complained of low back pain.  CT thoracic and lumbar spine showed metastatic lesion in the T12 vertebra.  He was evaluated by the gastroenterologist for acute on chronic anemia.  He was transfused with 1 unit of packed red blood cells and was also given IV iron infusion.  However, there is no evidence of active GI bleeding.  Endoscopic therapy has been deferred because of acute stroke.  He complained of bilateral leg and foot pain and he was found to cold feet and cool distal legs.  There was mottled appearance of  bilateral feet and there was concern for acute ischemia of bilateral lower extremities.  He was seen by the vascular surgeon and he underwent aortogram and selective bilateral lower extremity angiogram.  However, there was no evidence of arterial obstruction. There was concern for sepsis because of worsening altered mental status, tachycardia, hypotension, elevated procalcitonin and recurrence of lactic acidosis.  He was restarted on broad-spectrum IV antibiotics and was also treated with IV fluids.  He developed acute kidney injury and his condition worsened.  Case was discussed with healthcare power of attorney, Ms. Alwyn Pea, who decided that patient should be placed on comfort measures.     Assessment/Plan:   Principal Problem:   Acute metabolic encephalopathy Active Problems:   Renal cancer ([place])   Diabetes mellitus without complication ([place])   Hypertension   GERD (gastroesophageal reflux disease)   Hypothyroidism   CAP (community acquired pneumonia)   Lactic acidosis   Anemia of chronic disease   Aspiration pneumonia ([facility])   Failure to thrive in adult   Iron deficiency anemia due to chronic blood loss   Severe protein-calorie malnutrition ([facility])   Severe back pain   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H[facility])   Nutrition Problem: Increased nutrient needs Etiology: cancer and cancer related treatments (metastatic renal cancer on oral chemotherapy)  Signs/Symptoms: estimated needs   Body mass index is 25.81 kg/m.   S/p aortogram and selective bilateral lower extremity angiogram for suspected acute ischemia of bilateral legs on 04/21/2020.  However, there was no evidence of arterial obstruction in the aorta, iliac arteries or bilateral lower extremities.  There was evidence of extremely slow circulation time.  EKG showed sinus tachycardia.  There was no evidence of atrial fibrillation.  Recent 2D echo showed normal EF and grade 1 diastolic dysfunction.  SIRS /suspected sepsis in a patient with  underlying immunocompromised state: IV antibiotics and IV fluids have been discontinued.  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  Analgesics as needed for pain.  Acute left posterior frontal ischemic stroke: Discontinue aspirin.  Plavix will not be started because he has been transitioned to comfort measures.  Iron deficiency anemia/acute on chronic anemia: Status post transfusion with 1 unit of PRBCs and status post iron infusion.   Severe protein calorie malnutrition: Continue nutritional supplements  Type II DM: No need to check blood glucose levels.  Lactic acidosis: Resolved  Plan of care was discussed with Ms.Alwyn Pea.  She requested comfort measures because patient was not doing well.  Patient is now on comfort measures.  Consult hospice team to assist with management.    Diet Order    None         Consultants:  Neurologist, Dr. Doy Mince  Oncologist, Dr. Susy Manor  Gastroenterologist, Dr. Bonna Gains  Procedures:  None    Medications:   . baclofen  10 mg Oral TID  . feeding supplement  237 mL Oral TID BM  . insulin aspart  0-15 Units Subcutaneous TID WC  . oxyCODONE  20 mg Oral Q12H  . QUEtiapine  50 mg Oral QHS  . senna  2 tablet Oral BID  . tamsulosin  0.4 mg Oral Daily   Continuous Infusions:    Anti-infectives (From admission, onward)   Start     Dose/Rate Route Frequency Ordered Stop   04/19/20 0800  vancomycin (VANCOREADY) IVPB 1250 mg/250 mL  Status:  Discontinued        1,250 mg 166.7 mL/hr over 90 Minutes Intravenous Every 24 hours 04/18/20 0736 04/18/20 1134   04/18/20 2200  ceFEPIme (MAXIPIME) 2 g in sodium chloride 0.9 % 100 mL IVPB  Status:  Discontinued        2 g 200 mL/hr over  30 Minutes Intravenous Every 12 hours 04/18/20 0733 04/18/20 1134   04/18/20 0600  ceFAZolin (ANCEF) IVPB 2g/100 mL premix       Note to Pharmacy: To be given in specials   2 g 200 mL/hr over 30 Minutes Intravenous  Once 04/16/2020 1106 04/16/2020 1222   04/18/20 0500  vancomycin (VANCOREADY) IVPB 1250 mg/250 mL  Status:  Discontinued        1,250 mg 166.7 mL/hr over 90 Minutes Intravenous Every 12 hours 04/14/2020 1644 04/04/2020 1656   04/18/20 0500  vancomycin (VANCOCIN) IVPB 1000 mg/200 mL premix  Status:  Discontinued        1,000 mg 200 mL/hr over 60 Minutes Intravenous Every 12 hours 04/19/2020 1656 04/18/20 0736   04/09/2020 1800  vancomycin (VANCOREADY) IVPB 2000 mg/400 mL        2,000 mg 200 mL/hr over 120 Minutes Intravenous  Once 04/16/2020 1640 04/29/2020 2015   04/11/2020 1800  ceFEPIme (MAXIPIME) 2 g in sodium chloride 0.9 % 100 mL IVPB  Status:  Discontinued        2 g 200 mL/hr over 30 Minutes Intravenous Every 8 hours 04/05/2020 1640 04/18/20 0733   04/27/2020 1108  clindamycin (CLEOCIN) 300 MG/50ML IVPB       Note to Pharmacy: Corlis Hove   : cabinet override      04/05/2020 1108 04/15/2020 2314   04/15/20 1100  amoxicillin-clavulanate (AUGMENTIN) 875-125 MG per tablet 1 tablet  Status:  Discontinued        1 tablet Oral Every 12 hours 04/15/20 1000 04/08/2020 1640   04/15/20 1000  azithromycin (ZITHROMAX) tablet 500 mg  Status:  Discontinued        500 mg Oral Daily 04/15/20 0841 04/15/20 1000   04/14/20 1400  Ampicillin-Sulbactam (UNASYN) 3 g in sodium chloride 0.9 % 100 mL IVPB  Status:  Discontinued        3 g 200 mL/hr over 30 Minutes Intravenous Every 6 hours 04/14/20 1239 04/15/20 1000   04/27/2020 0900  cefTRIAXone (ROCEPHIN) 2 g in sodium chloride 0.9 % 100 mL IVPB  Status:  Discontinued        2 g 200 mL/hr over 30 Minutes Intravenous Every 24 hours 04/19/2020 0848 04/12/2020 0911   04/06/2020 0900  azithromycin (ZITHROMAX) 500 mg in sodium chloride 0.9 % 250 mL IVPB  Status:   Discontinued        500 mg 250 mL/hr over 60 Minutes Intravenous Every 24 hours 05/04/2020 0848 04/23/2020 0911   04/06/2020 0545  cefTRIAXone (ROCEPHIN) 2 g in sodium chloride 0.9 % 100 mL IVPB  Status:  Discontinued        2 g 200 mL/hr over 30 Minutes Intravenous Every 24 hours 04/21/2020 0542 04/14/20 1239   04/10/2020 0545  azithromycin (ZITHROMAX) 500 mg in sodium chloride 0.9 % 250 mL IVPB  Status:  Discontinued        500 mg 250 mL/hr over 60 Minutes Intravenous Every 24 hours 05/02/2020 0542 04/15/20 0841             Family Communication/Anticipated D/C date and plan/Code Status   DVT prophylaxis:      Code Status: DNR  Family Communication: None Disposition Plan:    Status is: Inpatient  Remains inpatient appropriate because:Ongoing diagnostic testing needed not appropriate for outpatient work up and Unsafe d/c plan   Dispo: The patient is from: Home              Anticipated d/c is to: Home              Anticipated d/c date is: 1 day              Patient currently is not medically stable to d/c.           Subjective:   Patient is confused and lethargic and unable to provide any history.  His nurses were at the bedside.  Objective:    Vitals:   04/18/20 0104 04/18/20 0410 04/18/20 0758 04/18/20 1126  BP: 92/69 93/68 95/72  100/74  Pulse: (!) 115 (!) 113 (!) 109 (!) 105  Resp: 16 15 17 18   Temp: 99 F (37.2 C) 98 F (36.7 C) 98.6 F (37 C) 98.4 F (36.9 C)  TempSrc: Axillary Oral Axillary Axillary  SpO2: 97% 97% 96% 98%  Weight:      Height:       No data found.   Intake/Output Summary (Last 24 hours) at 04/18/2020 1209 Last data filed at 04/18/2020 0451 Gross per 24 hour  Intake 178.58 ml  Output 200 ml  Net -21.42 ml   Filed Weights   04/06/2020 0345 04/23/2020 1132  Weight: 91.2 kg 91.2 kg  Exam:  GEN: No acute distress SKIN: Mottled appearance of bilateral distal legs and feet EYES: Anicteric  ENT: Moist mucous membrane CV:  Regular rate, tachycardic PULM: No wheezing or rales heard ABD: Soft, nontender CNS: Lethargic and nonverbal EXT: Bilateral leg edema and bilateral leg tenderness.  Cold distal legs and feet.  Unable to palpate dorsalis pedis pulses bilaterally  Data Reviewed:   I have personally reviewed following labs and imaging studies:  Labs: Labs show the following:   Basic Metabolic Panel: Recent Labs  Lab 05/04/2020 0358 04/30/2020 0358 04/14/20 0150 04/14/20 0150 04/16/20 0437 04/18/20 0509  NA 135  --  141  --  140 140  K 4.5   < > 4.0   < > 3.8 4.8  CL 101  --  106  --  105 107  CO2 20*  --  24  --  25 19*  GLUCOSE 167*  --  108*  --  170* 204*  BUN 12  --  10  --  10 27*  CREATININE 0.99  --  0.99  --  1.24 2.72*  CALCIUM 8.4*  --  8.4*  --  8.2* 8.6*  MG  --   --   --   --   --  2.1  PHOS  --   --   --   --   --  5.3*   < > = values in this interval not displayed.   GFR Estimated Creatinine Clearance: 32.7 mL/min (A) (by C-G formula based on SCr of 2.72 mg/dL (H)). Liver Function Tests: Recent Labs  Lab 04/15/2020 0358 04/14/20 0150 04/18/20 0509  AST 26 24 24   ALT 10 9 10   ALKPHOS 80 69 80  BILITOT 0.3 0.3 0.7  PROT 7.9 7.2 7.4  ALBUMIN 2.7* 2.3* 2.3*   Recent Labs  Lab 05/03/2020 0358  LIPASE 20   No results for input(s): AMMONIA in the last 168 hours. Coagulation profile Recent Labs  Lab 04/30/2020 0358 04/10/2020 1448  INR 1.0 1.0    CBC: Recent Labs  Lab 04/09/2020 0358 05/02/2020 0358 04/19/2020 1956 04/14/20 0150 04/15/20 0014 04/15/20 0318 04/16/20 0437 05/03/2020 0342 04/18/20 0509  WBC 6.7   < > 5.0   < > 4.7 4.6 6.4 8.9 12.6*  NEUTROABS 5.3  --  3.8  --   --  3.1  --  7.2 11.2*  HGB 7.9*   < > 6.9*   < > 8.0* 8.3* 8.5* 8.3* 7.1*  HCT 27.1*   < > 23.3*   < > 27.0* 27.9* 29.0* 27.9* 24.2*  MCV 76.1*   < > 74.9*   < > 76.7* 76.6* 77.7* 77.3* 79.3*  PLT 352   < > 323   < > 352 355 366 402* 391   < > = values in this interval not displayed.   Cardiac  Enzymes: No results for input(s): CKTOTAL, CKMB, CKMBINDEX, TROPONINI in the last 168 hours. BNP (last 3 results) No results for input(s): PROBNP in the last 8760 hours. CBG: Recent Labs  Lab 04/30/2020 1314 04/29/2020 1700 05/02/2020 2114 04/18/20 0757 04/18/20 1125  GLUCAP 170* 183* 164* 176* 162*   D-Dimer: No results for input(s): DDIMER in the last 72 hours. Hgb A1c: No results for input(s): HGBA1C in the last 72 hours. Lipid Profile: Recent Labs    04/16/20 0437  CHOL 170  HDL 47  LDLCALC 88  TRIG 175*  CHOLHDL 3.6   Thyroid function studies: Recent Labs  04/16/20 0437  TSH 7.730*   Anemia work up: No results for input(s): VITAMINB12, FOLATE, FERRITIN, TIBC, IRON, RETICCTPCT in the last 72 hours. Sepsis Labs: Recent Labs  Lab 04/28/2020 0358 05/01/2020 0641 04/23/2020 1956 04/14/20 0150 04/15/20 0318 04/16/20 0437 04/11/2020 0342 04/23/2020 1448 04/18/20 0509  PROCALCITON  --   --   --   --   --   --   --  3.14 4.05  WBC   < >  --  5.0   < > 4.6 6.4 8.9  --  12.6*  LATICACIDVEN  --  2.5* 1.4  --   --   --   --  3.1* 1.6   < > = values in this interval not displayed.    Microbiology Recent Results (from the past 240 hour(s))  Blood culture (routine single)     Status: None   Collection Time: 04/05/2020  3:58 AM   Specimen: BLOOD  Result Value Ref Range Status   Specimen Description BLOOD BLOOD RIGHT FOREARM  Final   Special Requests   Final    BOTTLES DRAWN AEROBIC AND ANAEROBIC Blood Culture results may not be optimal due to an excessive volume of blood received in culture bottles   Culture   Final    NO GROWTH 5 DAYS Performed at [facility], [address] [place], [place] [address]    Report Status 04/18/2020 FINAL  Final  Respiratory Panel by RT PCR (Flu A&B, Covid) - Nasopharyngeal Swab     Status: None   Collection Time: 04/09/2020  3:58 AM   Specimen: Nasopharyngeal Swab  Result Value Ref Range Status   SARS Coronavirus 2 by RT PCR  NEGATIVE NEGATIVE Final    Comment: (NOTE) SARS-CoV-2 target nucleic acids are NOT DETECTED.  The SARS-CoV-2 RNA is generally detectable in upper respiratoy specimens during the acute phase of infection. The lowest concentration of SARS-CoV-2 viral copies this assay can detect is 131 copies/mL. A negative result does not preclude SARS-Cov-2 infection and should not be used as the sole basis for treatment or other patient management decisions. A negative result may occur with  improper specimen collection/handling, submission of specimen other than nasopharyngeal swab, presence of viral mutation(s) within the areas targeted by this assay, and inadequate number of viral copies (<131 copies/mL). A negative result must be combined with clinical observations, patient history, and epidemiological information. The expected result is Negative.  Fact Sheet for Patients:  PinkCheek.be  Fact Sheet for Healthcare Providers:  GravelBags.it  This test is no t yet approved or cleared by the [place] FDA and  has been authorized for detection and/or diagnosis of SARS-CoV-2 by FDA under an Emergency Use Authorization (EUA). This EUA will remain  in effect (meaning this test can be used) for the duration of the COVID-19 declaration under Section 564(b)(1) of the Act, 21 U.S.C. section 360bbb-3(b)(1), unless the authorization is terminated or revoked sooner.     Influenza A by PCR NEGATIVE NEGATIVE Final   Influenza B by PCR NEGATIVE NEGATIVE Final    Comment: (NOTE) The Xpert Xpress SARS-CoV-2/FLU/RSV assay is intended as an aid in  the diagnosis of influenza from Nasopharyngeal swab specimens and  should not be used as a sole basis for treatment. Nasal washings and  aspirates are unacceptable for Xpert Xpress SARS-CoV-2/FLU/RSV  testing.  Fact Sheet for Patients: PinkCheek.be  Fact Sheet for  Healthcare Providers: GravelBags.it  This test is not yet approved or cleared by the [place] FDA  and  has been authorized for detection and/or diagnosis of SARS-CoV-2 by  FDA under an Emergency Use Authorization (EUA). This EUA will remain  in effect (meaning this test can be used) for the duration of the  Covid-19 declaration under Section 564(b)(1) of the Act, 21  U.S.C. section 360bbb-3(b)(1), unless the authorization is  terminated or revoked. Performed at [facility], [address] [place], [place] [address]   Culture, blood (single)     Status: None (Preliminary result)   Collection Time: 04/11/2020  1:36 PM   Specimen: BLOOD  Result Value Ref Range Status   Specimen Description BLOOD LEFT AC  Final   Special Requests   Final    BOTTLES DRAWN AEROBIC ONLY Blood Culture adequate volume   Culture  Setup Time   Final    AEROBIC BOTTLE ONLY GRAM POSITIVE RODS CRITICAL RESULT CALLED TO, READ BACK BY AND VERIFIED WITH: LISA KLUTTZ 04/06/2020 AT 1956 BY ACR Performed at [facility], [address] [place], [place] [address]    Culture GRAM POSITIVE RODS  Final   Report Status PENDING  Incomplete  Urine culture     Status: None   Collection Time: 05/02/2020  3:10 PM   Specimen: Urine, Random  Result Value Ref Range Status   Specimen Description   Final    URINE, RANDOM Performed at [facility], [address] [place], [place] [address]    Special Requests   Final    NONE Performed at [facility], [address] [place], [place] [address]    Culture   Final    NO GROWTH Performed at [facility], [place] [address] [place], [place] [address]    Report Status 04/14/2020 FINAL  Final  CULTURE, BLOOD (ROUTINE X 2) w Reflex to ID Panel     Status: None (Preliminary result)   Collection Time: 05/01/2020  2:48 PM   Specimen: BLOOD  Result Value Ref Range Status   Specimen Description BLOOD RIGHT  ANTECUBITAL  Final   Special Requests   Final    BOTTLES DRAWN AEROBIC AND ANAEROBIC Blood Culture adequate volume   Culture   Final    NO GROWTH < 24 HOURS Performed at [facility], [address], [place], [place] [address]    Report Status PENDING  Incomplete  CULTURE, BLOOD (ROUTINE X 2) w Reflex to ID Panel     Status: None (Preliminary result)   Collection Time: 04/11/2020  2:48 PM   Specimen: BLOOD  Result Value Ref Range Status   Specimen Description BLOOD BLOOD RIGHT HAND  Final   Special Requests   Final    BOTTLES DRAWN AEROBIC AND ANAEROBIC Blood Culture adequate volume   Culture   Final    NO GROWTH < 24 HOURS Performed at [facility], [address] [place], [place] [address]    Report Status PENDING  Incomplete  MRSA PCR Screening     Status: None   Collection Time: 04/18/20 10:08 AM   Specimen: Nasopharyngeal  Result Value Ref Range Status   MRSA by PCR NEGATIVE NEGATIVE Final    Comment:        The GeneXpert MRSA Assay (FDA approved for NASAL specimens only), is one component of a comprehensive MRSA colonization surveillance program. It is not intended to diagnose MRSA infection nor to guide or monitor treatment for MRSA infections. Performed at [facility], [address], [place],  [address]  Procedures and diagnostic studies:  CT THORACIC SPINE WO CONTRAST  Result Date: 04/09/2020 CLINICAL DATA:  64 year old male with mid and lower back pain. Progressive neurologic deficit. Altered mental status. Metastatic renal cell carcinoma. EXAM: CT THORACIC SPINE WITHOUT CONTRAST TECHNIQUE: Multidetector CT images of the thoracic were obtained using the standard protocol without intravenous contrast. COMPARISON:  Chest CT 04/28/2020. FINDINGS: Study is mildly degraded by motion artifact despite repeated imaging attempts, especially in the upper thoracic levels. Limited cervical spine imaging: Cervicothoracic junction  alignment is within normal limits. Thoracic spine segmentation:  Normal. Alignment: Stable, relatively normal thoracic vertebral height and alignment. No spondylolisthesis. Vertebrae: Motion artifact obscures the T2-T3 endplates, which were normal on the recent chest CT. Osteopenia but otherwise normal bone mineralization throughout the thoracic vertebrae above T12. At T12 there is a right lateral lytic osseous lesion measuring about 23 mm diameter (series 3, image 151). No involvement of the spinal canal or right T12 foramen. No other thoracic vertebral metastasis is evident by CT. Visible posterior ribs appear grossly intact. Paraspinal and other soft tissues: Bilateral pleural effusions appear increased since 04/26/2020, that on the left is now large. Otherwise stable visible thoracic and upper abdominal viscera. Disc levels: Mild for age thoracic spine degeneration. No CT evidence of thoracic spinal stenosis. IMPRESSION: 1. 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 No pathologic fracture. No involvement of the spinal canal or right T12 foramen. 2. No other thoracic vertebral metastasis is evident by CT. 3. Bilateral pleural effusions appear increased since 04/16/2020, now large on the left. Electronically Signed   By: Genevie Ann M.D.   On: 04/06/2020 17:13   CT LUMBAR SPINE WO CONTRAST  Result Date: 04/21/2020 CLINICAL DATA:  64 year old male with mid and lower back pain. Progressive neurologic deficit. Altered mental status. Metastatic renal cell carcinoma. EXAM: CT LUMBAR SPINE WITHOUT CONTRAST TECHNIQUE: Multidetector CT imaging of the lumbar spine was performed without intravenous contrast administration. Multiplanar CT image reconstructions were also generated. COMPARISON:  Thoracic spine CT today. FINDINGS: Segmentation: Normal. This is concordant with the thoracic numbering today. Alignment: Mild motion artifact at the L5-S1 level. Normal lumbar vertebral height and alignment.  Vertebrae: Lytic metastasis of the right lateral T12 body reported separately today. Osteopenia. No lumbar vertebral metastasis evident by CT. No metastasis in the visible sacrum. Paraspinal and other soft tissues: Partially visible very large right renal tumor (series 5, image 52) with bulky retroperitoneal nodal metastases with extracapsular extension (same image). Partially visible abnormal right adrenal gland. Partially visible nonspecific retroperitoneal and presacral stranding in the pelvis greater on the right. Disc levels: Lumbar spine degeneration is concordant with age. Mild disc bulging and endplate spurring with no significant lumbar spinal stenosis. IMPRESSION: 1. No lumbar metastasis evident by CT. 2. No lumbar acute osseous abnormality. Age-appropriate lumbar spine degeneration. 3. Very large right renal tumor, adrenal and retroperitoneal nodal metastases with extracapsular extension are partially visible. Electronically Signed   By: Genevie Ann M.D.   On: 05/02/2020 17:18   PERIPHERAL VASCULAR CATHETERIZATION  Result Date: 04/07/2020 See op note  DG Chest Port 1 View  Result Date: 04/15/2020 CLINICAL DATA:  Right-sided pain EXAM: PORTABLE CHEST 1 VIEW COMPARISON:  04/11/2020, CT of the thoracic spine from earlier in the same day. FINDINGS: Cardiac shadow is stable. Left-sided pleural effusion is noted with both a basilar component and loculated superolateral component similar to that noted on prior CT from earlier in the same day. The overall inspiratory effort is poor.  Left basilar atelectatic changes are noted. IMPRESSION: Increase in left-sided pleural effusion with a loculated component noted superiorly similar to that seen on recent CT of the thoracic spine. Overall poor inspiratory effort with left basilar atelectasis and left basilar effusion. Electronically Signed   By: Inez Catalina M.D.   On: 04/16/2020 19:14               LOS: 5 days   Dariel Betzer  [facility]    Pager on [email or web address]. If 7PM-7AM, please contact night-coverage at [email or web address]     04/18/2020, 12:09 PM

## 2020-04-18 NOTE — Progress Notes (Signed)
OT Cancellation Note  Patient Details Name: Hunter Davila MRN: [id number] DOB: 01-15-1956   Cancelled Treatment:    Reason Eval/Treat Not Completed: Active bedrest order. Pt with elevated MEWs score and very lethargic. Active bed rest orders at this time. OT will follow up once pt removed from active bed rest orders and medically able to participate.   Darleen Crocker, [place], OTR/L , CBIS ascom [phone]  04/18/20, 10:58 AM   04/18/2020, 10:57 AM

## 2020-04-18 NOTE — Progress Notes (Signed)
PT Cancellation Note  Patient Details Name: Hunter Davila MRN: [id number] DOB: 1955-08-15   Cancelled Treatment:     PT hold. Active bed rest orders in place. Pt has had elevated MEWs score and per chart review has unstable lab values. Will continue to follow and treat when pt is more appropriate to participate. Please discontinue bed rest orders when feel medically appropriate to participate in acute PT.    Willette Pa 04/18/2020, 9:19 AM

## 2020-04-19 DIAGNOSIS — C641 Malignant neoplasm of right kidney, except renal pelvis: Secondary | ICD-10-CM | POA: Diagnosis not present

## 2020-04-19 DIAGNOSIS — J189 Pneumonia, unspecified organism: Secondary | ICD-10-CM | POA: Diagnosis not present

## 2020-04-19 DIAGNOSIS — I6381 Other cerebral infarction due to occlusion or stenosis of small artery: Secondary | ICD-10-CM | POA: Diagnosis not present

## 2020-04-19 DIAGNOSIS — M549 Dorsalgia, unspecified: Secondary | ICD-10-CM | POA: Diagnosis not present

## 2020-04-19 LAB — CULTURE, BLOOD (SINGLE): Special Requests: ADEQUATE

## 2020-04-19 NOTE — Progress Notes (Signed)
Progress Note    [name]  GYJ:[id number] DOB: 04/03/1956  DOA: 04/10/2020 PCP: Patient, No Pcp Per      Brief Narrative:    Medical records reviewed and are as summarized below:  Hunter Davila is a 64 y.o. male with medical history significant for renal cell carcinoma on oral chemotherapy, hypothyroidism, diabetes mellitus, hypertension, GERD.  He was brought to the hospital because of right flank pain and altered mental status.  Reportedly, patient has had intermittent confusion and has been having worsening pain in the right flank and has also been having pain in the back.  He also reported difficulty walking, weakness in bilateral legs and had incontinence of urine.  He had also been dealing with insomnia.  MRI brain revealed acute to subacute left posterior frontal infarct.  There are also 2 lesions suspicious for metastatic disease in the brain.  Neurologist was consulted and dual antiplatelet therapy with aspirin and Plavix for 21 days followed by aspirin monotherapy was recommended.  He was treated with empiric antibiotics for left lower lobe aspiration pneumonia.  He was evaluated by oncologist for metastatic renal cell carcinoma.  There was some concern that chemotherapy was contributing to altered mental status.  He was previously on lenvatinib and axitinib but these have been discontinued because of their association with encephalopathy.  He complained of low back pain.  CT thoracic and lumbar spine showed metastatic lesion in the T12 vertebra.  He was evaluated by the gastroenterologist for acute on chronic anemia.  He was transfused with 1 unit of packed red blood cells and was also given IV iron infusion.  However, there is no evidence of active GI bleeding.  Endoscopic therapy has been deferred because of acute stroke.  He complained of bilateral leg and foot pain and he was found to cold feet and cool distal legs.  There was mottled appearance of  bilateral feet and there was concern for acute ischemia of bilateral lower extremities.  He was seen by the vascular surgeon and he underwent aortogram and selective bilateral lower extremity angiogram.  However, there was no evidence of arterial obstruction. There was concern for sepsis because of worsening altered mental status, tachycardia, hypotension, elevated procalcitonin and recurrence of lactic acidosis.  He was restarted on broad-spectrum IV antibiotics and was also treated with IV fluids.  He developed acute kidney injury and his condition worsened.  Case was discussed with healthcare power of attorney, Ms. Alwyn Pea, who decided that patient should be placed on comfort measures.     Assessment/Plan:   Principal Problem:   Acute metabolic encephalopathy Active Problems:   Renal cancer ([place])   Diabetes mellitus without complication ([place])   Hypertension   GERD (gastroesophageal reflux disease)   Hypothyroidism   CAP (community acquired pneumonia)   Lactic acidosis   Anemia of chronic disease   Aspiration pneumonia (HCC)   Failure to thrive in adult   Iron deficiency anemia due to chronic blood loss   Severe protein-calorie malnutrition (HCC)   Severe back pain   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HCC)   Nutrition Problem: Increased nutrient needs Etiology: cancer and cancer related treatments (metastatic renal cancer on oral chemotherapy)  Signs/Symptoms: estimated needs   Body mass index is 25.81 kg/m.     Status post aortogram and selective bilateral lower extremity angiogram for suspected acute ischemia of bilateral legs on 04/20/2020.  No evidence of arterial obstruction.  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  Awaiting hospice consult.  Patient will likely be discharged to hospice home.  Plan of care was discussed with family (HPOA) at the bedside.    Diet Order            Diet regular Room service appropriate? Yes; Fluid consistency: Thin  Diet effective now                    Consultants:  Neurologist, Dr. Doy Mince  Oncologist, Dr. Susy Manor  Gastroenterologist, Dr. Bonna Gains  Procedures:  None    Medications:   . baclofen  10 mg Oral TID  . feeding supplement  237 mL Oral TID BM  . oxyCODONE  20 mg Oral Q12H  . QUEtiapine  50 mg Oral QHS  . senna  2 tablet Oral BID   Continuous Infusions:    Anti-infectives (From admission, onward)   Start     Dose/Rate Route Frequency Ordered Stop   04/19/20 0800  vancomycin (VANCOREADY) IVPB 1250 mg/250 mL  Status:  Discontinued        1,250 mg 166.7 mL/hr over 90 Minutes Intravenous Every 24 hours 04/18/20 0736 04/18/20 1134   04/18/20 2200  ceFEPIme (MAXIPIME) 2 g in sodium chloride 0.9 % 100 mL IVPB  Status:  Discontinued        2 g 200 mL/hr over 30 Minutes Intravenous Every 12 hours 04/18/20 0733 04/18/20 1134   04/18/20 0600  ceFAZolin (ANCEF) IVPB 2g/100 mL premix       Note to Pharmacy: To be given in specials   2 g 200 mL/hr over 30 Minutes Intravenous  Once 04/12/2020 1106 05/01/2020 1222   04/18/20 0500  vancomycin (VANCOREADY) IVPB 1250 mg/250 mL  Status:  Discontinued        1,250 mg 166.7 mL/hr over 90 Minutes Intravenous Every 12 hours 04/08/2020 1644 05/02/2020 1656   04/18/20 0500  vancomycin (VANCOCIN) IVPB 1000 mg/200 mL premix  Status:  Discontinued        1,000 mg 200 mL/hr over 60 Minutes Intravenous Every 12 hours 04/18/2020 1656 04/18/20 0736   04/26/2020 1800  vancomycin (VANCOREADY) IVPB 2000 mg/400 mL         2,000 mg 200 mL/hr over 120 Minutes Intravenous  Once 04/12/2020 1640 04/10/2020 2015   04/23/2020 1800  ceFEPIme (MAXIPIME) 2 g in sodium chloride 0.9 % 100 mL IVPB  Status:  Discontinued        2 g 200 mL/hr over 30 Minutes Intravenous Every 8 hours 04/09/2020 1640 04/18/20 0733   04/26/2020 1108  clindamycin (CLEOCIN) 300 MG/50ML IVPB       Note to Pharmacy: Corlis Hove   : cabinet override      04/29/2020 1108 04/24/2020 2314   04/15/20 1100  amoxicillin-clavulanate (AUGMENTIN) 875-125 MG per tablet 1 tablet  Status:  Discontinued        1 tablet Oral Every 12 hours 04/15/20 1000 04/19/2020 1640   04/15/20 1000  azithromycin (ZITHROMAX) tablet 500 mg  Status:  Discontinued        500 mg Oral Daily 04/15/20 0841 04/15/20 1000  04/14/20 1400  Ampicillin-Sulbactam (UNASYN) 3 g in sodium chloride 0.9 % 100 mL IVPB  Status:  Discontinued        3 g 200 mL/hr over 30 Minutes Intravenous Every 6 hours 04/14/20 1239 04/15/20 1000   04/30/2020 0900  cefTRIAXone (ROCEPHIN) 2 g in sodium chloride 0.9 % 100 mL IVPB  Status:  Discontinued        2 g 200 mL/hr over 30 Minutes Intravenous Every 24 hours 04/16/2020 0848 04/14/2020 0911   04/05/2020 0900  azithromycin (ZITHROMAX) 500 mg in sodium chloride 0.9 % 250 mL IVPB  Status:  Discontinued        500 mg 250 mL/hr over 60 Minutes Intravenous Every 24 hours 04/06/2020 0848 04/21/2020 0911   04/24/2020 0545  cefTRIAXone (ROCEPHIN) 2 g in sodium chloride 0.9 % 100 mL IVPB  Status:  Discontinued        2 g 200 mL/hr over 30 Minutes Intravenous Every 24 hours 04/14/2020 0542 04/14/20 1239   04/15/2020 0545  azithromycin (ZITHROMAX) 500 mg in sodium chloride 0.9 % 250 mL IVPB  Status:  Discontinued        500 mg 250 mL/hr over 60 Minutes Intravenous Every 24 hours 04/04/2020 0542 04/15/20 0841             Family Communication/Anticipated D/C date and plan/Code Status   DVT prophylaxis:      Code Status: DNR  Family Communication: None Disposition Plan:    Status  is: Inpatient  Remains inpatient appropriate because:Ongoing diagnostic testing needed not appropriate for outpatient work up and Unsafe d/c plan   Dispo: The patient is from: Home              Anticipated d/c is to: Hospice house              Anticipated d/c date is: 2 days              Patient currently is not medically stable to d/c.           Subjective:   Patient is lethargic and unable to provide any history.  Mateo Flow, healthcare power of attorney, was at the bedside.  Objective:    Vitals:   04/18/20 0758 04/18/20 1126 04/19/20 0212 04/19/20 0801  BP: 95/72 100/74 101/69 98/65  Pulse: (!) 109 (!) 105 (!) 112 (!) 111  Resp: 17 18 14 10   Temp: 98.6 F (37 C) 98.4 F (36.9 C) 98.4 F (36.9 C) 98.9 F (37.2 C)  TempSrc: Axillary Axillary Oral Oral  SpO2: 96% 98% 99% 92%  Weight:      Height:       No data found.   Intake/Output Summary (Last 24 hours) at 04/19/2020 1056 Last data filed at 04/19/2020 0251 Gross per 24 hour  Intake --  Output 150 ml  Net -150 ml   Filed Weights   04/26/2020 0345 04/29/2020 1132  Weight: 91.2 kg 91.2 kg    Exam:  GEN: NAD SKIN: Multiple appearance of bilateral legs and feet EYES: No acute abnormality noted ENT: MMM CV: RRR PULM: CTA B ABD: soft, nontender CNS: Lethargic and does not follow commands EXT: Bilateral leg edema.  Right foot is warm but left foot is cold to touch GU: Foley catheter draining amber urine    Data Reviewed:   I have personally reviewed following labs and imaging studies:  Labs: Labs show the following:   Basic Metabolic Panel: Recent Labs  Lab 04/20/2020 0358 04/21/2020 0358  04/14/20 0150 04/14/20 0150 04/16/20 0437 04/18/20 0509  NA 135  --  141  --  140 140  K 4.5   < > 4.0   < > 3.8 4.8  CL 101  --  106  --  105 107  CO2 20*  --  24  --  25 19*  GLUCOSE 167*  --  108*  --  170* 204*  BUN 12  --  10  --  10 27*  CREATININE 0.99  --  0.99  --  1.24 2.72*  CALCIUM 8.4*  --   8.4*  --  8.2* 8.6*  MG  --   --   --   --   --  2.1  PHOS  --   --   --   --   --  5.3*   < > = values in this interval not displayed.   GFR Estimated Creatinine Clearance: 32.7 mL/min (A) (by C-G formula based on SCr of 2.72 mg/dL (H)). Liver Function Tests: Recent Labs  Lab 04/06/2020 0358 04/14/20 0150 04/18/20 0509  AST 26 24 24   ALT 10 9 10   ALKPHOS 80 69 80  BILITOT 0.3 0.3 0.7  PROT 7.9 7.2 7.4  ALBUMIN 2.7* 2.3* 2.3*   Recent Labs  Lab 04/15/2020 0358  LIPASE 20   No results for input(s): AMMONIA in the last 168 hours. Coagulation profile Recent Labs  Lab 04/18/2020 0358 04/05/2020 1448  INR 1.0 1.0    CBC: Recent Labs  Lab 04/09/2020 0358 04/11/2020 0358 04/20/2020 1956 04/14/20 0150 04/15/20 0014 04/15/20 0318 04/16/20 0437 05/03/2020 0342 04/18/20 0509  WBC 6.7   < > 5.0   < > 4.7 4.6 6.4 8.9 12.6*  NEUTROABS 5.3  --  3.8  --   --  3.1  --  7.2 11.2*  HGB 7.9*   < > 6.9*   < > 8.0* 8.3* 8.5* 8.3* 7.1*  HCT 27.1*   < > 23.3*   < > 27.0* 27.9* 29.0* 27.9* 24.2*  MCV 76.1*   < > 74.9*   < > 76.7* 76.6* 77.7* 77.3* 79.3*  PLT 352   < > 323   < > 352 355 366 402* 391   < > = values in this interval not displayed.   Cardiac Enzymes: No results for input(s): CKTOTAL, CKMB, CKMBINDEX, TROPONINI in the last 168 hours. BNP (last 3 results) No results for input(s): PROBNP in the last 8760 hours. CBG: Recent Labs  Lab 04/28/2020 1314 05/03/2020 1700 04/09/2020 2114 04/18/20 0757 04/18/20 1125  GLUCAP 170* 183* 164* 176* 162*   D-Dimer: No results for input(s): DDIMER in the last 72 hours. Hgb A1c: No results for input(s): HGBA1C in the last 72 hours. Lipid Profile: No results for input(s): CHOL, HDL, LDLCALC, TRIG, CHOLHDL, LDLDIRECT in the last 72 hours. Thyroid function studies: No results for input(s): TSH, T4TOTAL, T3FREE, THYROIDAB in the last 72 hours.  Invalid input(s): FREET3 Anemia work up: No results for input(s): VITAMINB12, FOLATE, FERRITIN, TIBC,  IRON, RETICCTPCT in the last 72 hours. Sepsis Labs: Recent Labs  Lab 04/12/2020 0358 04/09/2020 0641 04/11/2020 1956 04/14/20 0150 04/15/20 0318 04/16/20 0437 04/26/2020 0342 04/25/2020 1448 04/18/20 0509  PROCALCITON  --   --   --   --   --   --   --  3.14 4.05  WBC   < >  --  5.0   < > 4.6 6.4 8.9  --  12.6*  LATICACIDVEN  --  2.5* 1.4  --   --   --   --  3.1* 1.6   < > = values in this interval not displayed.    Microbiology Recent Results (from the past 240 hour(s))  Blood culture (routine single)     Status: None   Collection Time: 04/28/2020  3:58 AM   Specimen: BLOOD  Result Value Ref Range Status   Specimen Description BLOOD BLOOD RIGHT FOREARM  Final   Special Requests   Final    BOTTLES DRAWN AEROBIC AND ANAEROBIC Blood Culture results may not be optimal due to an excessive volume of blood received in culture bottles   Culture   Final    NO GROWTH 5 DAYS Performed at [facility], [address] [place], [place] [address]    Report Status 04/18/2020 FINAL  Final  Respiratory Panel by RT PCR (Flu A&B, Covid) - Nasopharyngeal Swab     Status: None   Collection Time: 04/11/2020  3:58 AM   Specimen: Nasopharyngeal Swab  Result Value Ref Range Status   SARS Coronavirus 2 by RT PCR NEGATIVE NEGATIVE Final    Comment: (NOTE) SARS-CoV-2 target nucleic acids are NOT DETECTED.  The SARS-CoV-2 RNA is generally detectable in upper respiratoy specimens during the acute phase of infection. The lowest concentration of SARS-CoV-2 viral copies this assay can detect is 131 copies/mL. A negative result does not preclude SARS-Cov-2 infection and should not be used as the sole basis for treatment or other patient management decisions. A negative result may occur with  improper specimen collection/handling, submission of specimen other than nasopharyngeal swab, presence of viral mutation(s) within the areas targeted by this assay, and inadequate number of viral copies (<131  copies/mL). A negative result must be combined with clinical observations, patient history, and epidemiological information. The expected result is Negative.  Fact Sheet for Patients:  PinkCheek.be  Fact Sheet for Healthcare Providers:  GravelBags.it  This test is no t yet approved or cleared by the [place] FDA and  has been authorized for detection and/or diagnosis of SARS-CoV-2 by FDA under an Emergency Use Authorization (EUA). This EUA will remain  in effect (meaning this test can be used) for the duration of the COVID-19 declaration under Section 564(b)(1) of the Act, 21 U.S.C. section 360bbb-3(b)(1), unless the authorization is terminated or revoked sooner.     Influenza A by PCR NEGATIVE NEGATIVE Final   Influenza B by PCR NEGATIVE NEGATIVE Final    Comment: (NOTE) The Xpert Xpress SARS-CoV-2/FLU/RSV assay is intended as an aid in  the diagnosis of influenza from Nasopharyngeal swab specimens and  should not be used as a sole basis for treatment. Nasal washings and  aspirates are unacceptable for Xpert Xpress SARS-CoV-2/FLU/RSV  testing.  Fact Sheet for Patients: PinkCheek.be  Fact Sheet for Healthcare Providers: GravelBags.it  This test is not yet approved or cleared by the [place] FDA and  has been authorized for detection and/or diagnosis of SARS-CoV-2 by  FDA under an Emergency Use Authorization (EUA). This EUA will remain  in effect (meaning this test can be used) for the duration of the  Covid-19 declaration under Section 564(b)(1) of the Act, 21  U.S.C. section 360bbb-3(b)(1), unless the authorization is  terminated or revoked. Performed at [facility], [address] [place], [place] [address]   Culture, blood (single)     Status: Abnormal   Collection Time: 04/10/2020  1:36 PM   Specimen: BLOOD  Result Value Ref Range  Status   Specimen Description   Final    BLOOD LEFT AC Performed at [facility], [address] [place], [place] [address]    Special Requests   Final    BOTTLES DRAWN AEROBIC ONLY Blood Culture adequate volume Performed at [facility], [address] [place], [place] [address]    Culture  Setup Time   Final    AEROBIC BOTTLE ONLY GRAM POSITIVE RODS CRITICAL RESULT CALLED TO, READ BACK BY AND VERIFIED WITH: LISA KLUTTZ 04/21/2020 AT 1956 BY ACR Performed at [facility], [address] [place], [place] [address]    Culture (A)  Final    DIPHTHEROIDS(CORYNEBACTERIUM SPECIES) Standardized susceptibility testing for this organism is not available. Performed at [facility], [place] [address], [place], [place] [address]    Report Status 04/19/2020 FINAL  Final  Urine culture     Status: None   Collection Time: 04/29/2020  3:10 PM   Specimen: Urine, Random  Result Value Ref Range Status   Specimen Description   Final    URINE, RANDOM Performed at [facility], [address], [place], [place] [address]    Special Requests   Final    NONE Performed at [facility], [address] [place], [place] [address]    Culture   Final    NO GROWTH Performed at [facility], [place] [address], [place], [place] [address]    Report Status 04/14/2020 FINAL  Final  CULTURE, BLOOD (ROUTINE X 2) w Reflex to ID Panel     Status: None (Preliminary result)   Collection Time: 04/20/2020  2:48 PM   Specimen: BLOOD  Result Value Ref Range Status   Specimen Description BLOOD RIGHT ANTECUBITAL  Final   Special Requests   Final    BOTTLES DRAWN AEROBIC AND ANAEROBIC Blood Culture adequate volume   Culture   Final    NO GROWTH 2 DAYS Performed at [facility], [address], [place], [place] [address]    Report Status PENDING  Incomplete  CULTURE, BLOOD (ROUTINE X 2) w Reflex to ID Panel     Status: None (Preliminary result)    Collection Time: 04/21/2020  2:48 PM   Specimen: BLOOD  Result Value Ref Range Status   Specimen Description BLOOD BLOOD RIGHT HAND  Final   Special Requests   Final    BOTTLES DRAWN AEROBIC AND ANAEROBIC Blood Culture adequate volume   Culture   Final    NO GROWTH 2 DAYS Performed at [facility], [address], [place], [place] [address]    Report Status PENDING  Incomplete  MRSA PCR Screening     Status: None   Collection Time: 04/18/20 10:08 AM   Specimen: Nasopharyngeal  Result Value Ref Range Status   MRSA by PCR NEGATIVE NEGATIVE Final    Comment:        The GeneXpert MRSA Assay (FDA approved for NASAL specimens only), is one component of a comprehensive MRSA colonization surveillance program. It is not intended to diagnose MRSA infection nor to guide or monitor treatment for MRSA infections. Performed at [facility], [address] [place], [place] [address]     Procedures and diagnostic studies:  CT THORACIC SPINE WO CONTRAST  Result Date: 05/02/2020 CLINICAL DATA:  64 year old male with mid and lower back pain. Progressive neurologic deficit. Altered mental status. Metastatic renal cell carcinoma. EXAM: CT THORACIC SPINE WITHOUT CONTRAST TECHNIQUE: Multidetector CT images  of the thoracic were obtained using the standard protocol without intravenous contrast. COMPARISON:  Chest CT 04/15/2020. FINDINGS: Study is mildly degraded by motion artifact despite repeated imaging attempts, especially in the upper thoracic levels. Limited cervical spine imaging: Cervicothoracic junction alignment is within normal limits. Thoracic spine segmentation:  Normal. Alignment: Stable, relatively normal thoracic vertebral height and alignment. No spondylolisthesis. Vertebrae: Motion artifact obscures the T2-T3 endplates, which were normal on the recent chest CT. Osteopenia but otherwise normal bone mineralization throughout the thoracic vertebrae above T12. At T12 there  is a right lateral lytic osseous lesion measuring about 23 mm diameter (series 3, image 151). No involvement of the spinal canal or right T12 foramen. No other thoracic vertebral metastasis is evident by CT. Visible posterior ribs appear grossly intact. Paraspinal and other soft tissues: Bilateral pleural effusions appear increased since 04/29/2020, that on the left is now large. Otherwise stable visible thoracic and upper abdominal viscera. Disc levels: Mild for age thoracic spine degeneration. No CT evidence of thoracic spinal stenosis. IMPRESSION: 1. 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 No pathologic fracture. No involvement of the spinal canal or right T12 foramen. 2. No other thoracic vertebral metastasis is evident by CT. 3. Bilateral pleural effusions appear increased since 04/18/2020, now large on the left. Electronically Signed   By: Genevie Ann M.D.   On: 04/11/2020 17:13   CT LUMBAR SPINE WO CONTRAST  Result Date: 05/03/2020 CLINICAL DATA:  64 year old male with mid and lower back pain. Progressive neurologic deficit. Altered mental status. Metastatic renal cell carcinoma. EXAM: CT LUMBAR SPINE WITHOUT CONTRAST TECHNIQUE: Multidetector CT imaging of the lumbar spine was performed without intravenous contrast administration. Multiplanar CT image reconstructions were also generated. COMPARISON:  Thoracic spine CT today. FINDINGS: Segmentation: Normal. This is concordant with the thoracic numbering today. Alignment: Mild motion artifact at the L5-S1 level. Normal lumbar vertebral height and alignment. Vertebrae: Lytic metastasis of the right lateral T12 body reported separately today. Osteopenia. No lumbar vertebral metastasis evident by CT. No metastasis in the visible sacrum. Paraspinal and other soft tissues: Partially visible very large right renal tumor (series 5, image 52) with bulky retroperitoneal nodal metastases with extracapsular extension (same image). Partially  visible abnormal right adrenal gland. Partially visible nonspecific retroperitoneal and presacral stranding in the pelvis greater on the right. Disc levels: Lumbar spine degeneration is concordant with age. Mild disc bulging and endplate spurring with no significant lumbar spinal stenosis. IMPRESSION: 1. No lumbar metastasis evident by CT. 2. No lumbar acute osseous abnormality. Age-appropriate lumbar spine degeneration. 3. Very large right renal tumor, adrenal and retroperitoneal nodal metastases with extracapsular extension are partially visible. Electronically Signed   By: Genevie Ann M.D.   On: 04/24/2020 17:18   PERIPHERAL VASCULAR CATHETERIZATION  Result Date: 04/25/2020 See op note  DG Chest Port 1 View  Result Date: 04/25/2020 CLINICAL DATA:  Right-sided pain EXAM: PORTABLE CHEST 1 VIEW COMPARISON:  04/30/2020, CT of the thoracic spine from earlier in the same day. FINDINGS: Cardiac shadow is stable. Left-sided pleural effusion is noted with both a basilar component and loculated superolateral component similar to that noted on prior CT from earlier in the same day. The overall inspiratory effort is poor. Left basilar atelectatic changes are noted. IMPRESSION: Increase in left-sided pleural effusion with a loculated component noted superiorly similar to that seen on recent CT of the thoracic spine. Overall poor inspiratory effort with left basilar atelectasis and left basilar effusion. Electronically Signed  By: Inez Catalina M.D.   On: 04/05/2020 19:14               LOS: 6 days   [facility]   Pager on [email or web address]. If 7PM-7AM, please contact night-coverage at [email or web address]     04/19/2020, 10:56 AM

## 2020-04-19 NOTE — Progress Notes (Signed)
Patient resting in bed. Responds to voice. No acute distress noted.

## 2020-04-20 DIAGNOSIS — C641 Malignant neoplasm of right kidney, except renal pelvis: Secondary | ICD-10-CM | POA: Diagnosis not present

## 2020-04-20 DIAGNOSIS — J189 Pneumonia, unspecified organism: Secondary | ICD-10-CM | POA: Diagnosis not present

## 2020-04-20 NOTE — Progress Notes (Signed)
Progress Note    VIHAN SANTAGATA  XIP:[id number] DOB: Feb 11, 1956  DOA: 04/19/2020 PCP: Patient, No Pcp Per      Brief Narrative:    Medical records reviewed and are as summarized below:  BREVEN GUIDROZ is a 64 y.o. male with medical history significant for renal cell carcinoma on oral chemotherapy, hypothyroidism, diabetes mellitus, hypertension, GERD.  He was brought to the hospital because of right flank pain and altered mental status.  Reportedly, patient has had intermittent confusion and has been having worsening pain in the right flank and has also been having pain in the back.  He also reported difficulty walking, weakness in bilateral legs and had incontinence of urine.  He had also been dealing with insomnia.  MRI brain revealed acute to subacute left posterior frontal infarct.  There are also 2 lesions suspicious for metastatic disease in the brain.  Neurologist was consulted and dual antiplatelet therapy with aspirin and Plavix for 21 days followed by aspirin monotherapy was recommended.  He was treated with empiric antibiotics for left lower lobe aspiration pneumonia.  He was evaluated by oncologist for metastatic renal cell carcinoma.  There was some concern that chemotherapy was contributing to altered mental status.  He was previously on lenvatinib and axitinib but these have been discontinued because of their association with encephalopathy.  He complained of low back pain.  CT thoracic and lumbar spine showed metastatic lesion in the T12 vertebra.  He was evaluated by the gastroenterologist for acute on chronic anemia.  He was transfused with 1 unit of packed red blood cells and was also given IV iron infusion.  However, there is no evidence of active GI bleeding.  Endoscopic therapy has been deferred because of acute stroke.  He complained of bilateral leg and foot pain and he was found to cold feet and cool distal legs.  There was mottled appearance of  bilateral feet and there was concern for acute ischemia of bilateral lower extremities.  He was seen by the vascular surgeon and he underwent aortogram and selective bilateral lower extremity angiogram.  However, there was no evidence of arterial obstruction. There was concern for sepsis because of worsening altered mental status, tachycardia, hypotension, elevated procalcitonin and recurrence of lactic acidosis.  He was restarted on broad-spectrum IV antibiotics and was also treated with IV fluids.  He developed acute kidney injury and his condition worsened.  Case was discussed with healthcare power of attorney, Ms. Alwyn Pea, who decided that patient should be placed on comfort measures.     Assessment/Plan:   Principal Problem:   Acute metabolic encephalopathy Active Problems:   Renal cancer ([place])   Diabetes mellitus without complication ([place])   Hypertension   GERD (gastroesophageal reflux disease)   Hypothyroidism   CAP (community acquired pneumonia)   Lactic acidosis   Anemia of chronic disease   Aspiration pneumonia (HCC)   Failure to thrive in adult   Iron deficiency anemia due to chronic blood loss   Severe protein-calorie malnutrition (HCC)   Severe back pain   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HCC)   Nutrition Problem: Increased nutrient needs Etiology: cancer and cancer related treatments (metastatic renal cancer on oral chemotherapy)  Signs/Symptoms: estimated needs   Body mass index is 25.81 kg/m.     Status post aortogram and selective bilateral lower extremity angiogram for suspected acute ischemia of bilateral legs on 04/21/2020.  No evidence of arterial obstruction.  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  Awaiting hospice consult for possible discharge to hospice home.  Plan of care was discussed with Benetta Spar POA, at the bedside.    Diet Order            Diet regular Room service appropriate? Yes; Fluid consistency: Thin  Diet effective now                    Consultants:  Neurologist, Dr. Doy Mince  Oncologist, Dr. Susy Manor  Gastroenterologist, Dr. Bonna Gains  Procedures:  None    Medications:   . baclofen  10 mg Oral TID  . feeding supplement  237 mL Oral TID BM  . oxyCODONE  20 mg Oral Q12H  . QUEtiapine  50 mg Oral QHS  . senna  2 tablet Oral BID   Continuous Infusions:    Anti-infectives (From admission, onward)   Start     Dose/Rate Route Frequency Ordered Stop   04/19/20 0800  vancomycin (VANCOREADY) IVPB 1250 mg/250 mL  Status:  Discontinued        1,250 mg 166.7 mL/hr over 90 Minutes Intravenous Every 24 hours 04/18/20 0736 04/18/20 1134   04/18/20 2200  ceFEPIme (MAXIPIME) 2 g in sodium chloride 0.9 % 100 mL IVPB  Status:  Discontinued        2 g 200 mL/hr over 30 Minutes Intravenous Every 12 hours 04/18/20 0733 04/18/20 1134   04/18/20 0600  ceFAZolin (ANCEF) IVPB 2g/100 mL premix       Note to Pharmacy: To be given in specials   2 g 200 mL/hr over 30 Minutes Intravenous  Once 04/11/2020 1106 04/23/2020 1222   04/18/20 0500  vancomycin (VANCOREADY) IVPB 1250 mg/250 mL  Status:  Discontinued        1,250 mg 166.7 mL/hr over 90 Minutes Intravenous Every 12 hours 04/19/2020 1644 04/21/2020 1656   04/18/20 0500  vancomycin (VANCOCIN) IVPB 1000 mg/200 mL premix  Status:  Discontinued        1,000 mg 200 mL/hr over 60 Minutes Intravenous Every 12 hours 04/05/2020 1656 04/18/20 0736   04/23/2020 1800  vancomycin (VANCOREADY) IVPB 2000 mg/400 mL        2,000  mg 200 mL/hr over 120 Minutes Intravenous  Once 04/30/2020 1640 04/20/2020 2015   04/11/2020 1800  ceFEPIme (MAXIPIME) 2 g in sodium chloride 0.9 % 100 mL IVPB  Status:  Discontinued        2 g 200 mL/hr over 30 Minutes Intravenous Every 8 hours 04/23/2020 1640 04/18/20 0733   04/16/2020 1108  clindamycin (CLEOCIN) 300 MG/50ML IVPB       Note to Pharmacy: Corlis Hove   : cabinet override      04/08/2020 1108 04/19/2020 2314   04/15/20 1100  amoxicillin-clavulanate (AUGMENTIN) 875-125 MG per tablet 1 tablet  Status:  Discontinued        1 tablet Oral Every 12 hours 04/15/20 1000 04/14/2020 1640   04/15/20 1000  azithromycin (ZITHROMAX) tablet 500 mg  Status:  Discontinued        500 mg Oral Daily 04/15/20 0841 04/15/20 1000  04/14/20 1400  Ampicillin-Sulbactam (UNASYN) 3 g in sodium chloride 0.9 % 100 mL IVPB  Status:  Discontinued        3 g 200 mL/hr over 30 Minutes Intravenous Every 6 hours 04/14/20 1239 04/15/20 1000   04/23/2020 0900  cefTRIAXone (ROCEPHIN) 2 g in sodium chloride 0.9 % 100 mL IVPB  Status:  Discontinued        2 g 200 mL/hr over 30 Minutes Intravenous Every 24 hours 04/21/2020 0848 04/19/2020 0911   04/16/2020 0900  azithromycin (ZITHROMAX) 500 mg in sodium chloride 0.9 % 250 mL IVPB  Status:  Discontinued        500 mg 250 mL/hr over 60 Minutes Intravenous Every 24 hours 04/21/2020 0848 04/04/2020 0911   04/21/2020 0545  cefTRIAXone (ROCEPHIN) 2 g in sodium chloride 0.9 % 100 mL IVPB  Status:  Discontinued        2 g 200 mL/hr over 30 Minutes Intravenous Every 24 hours 04/27/2020 0542 04/14/20 1239   05/03/2020 0545  azithromycin (ZITHROMAX) 500 mg in sodium chloride 0.9 % 250 mL IVPB  Status:  Discontinued        500 mg 250 mL/hr over 60 Minutes Intravenous Every 24 hours 04/27/2020 0542 04/15/20 0841             Family Communication/Anticipated D/C date and plan/Code Status   DVT prophylaxis:      Code Status: DNR  Family Communication: None Disposition Plan:    Status is:  Inpatient  Remains inpatient appropriate because:Unsafe d/c plan   Dispo: The patient is from: Home              Anticipated d/c is to: Hospice house              Anticipated d/c date is: 1 day              Patient currently is not medically stable to d/c.           Subjective:   Patient is unresponsive and does not provide any history.  HPOA, Mateo Flow, is at the bedside   Objective:    Vitals:   04/18/20 1126 04/19/20 0212 04/19/20 0801 04/20/20 0805  BP: 100/74 101/69 98/65 (!) 90/57  Pulse: (!) 105 (!) 112 (!) 111 (!) 106  Resp: 18 14 10  (!) 22  Temp: 98.4 F (36.9 C) 98.4 F (36.9 C) 98.9 F (37.2 C) 98.7 F (37.1 C)  TempSrc: Axillary Oral Oral   SpO2: 98% 99% 92% 96%  Weight:      Height:       No data found.   Intake/Output Summary (Last 24 hours) at 04/20/2020 1003 Last data filed at 04/20/2020 0556 Gross per 24 hour  Intake --  Output 70 ml  Net -70 ml   Filed Weights   04/09/2020 0345 04/28/2020 1132  Weight: 91.2 kg 91.2 kg    Exam:  GEN: NAD SKIN: Warm and dry EYES: EOMI ENT: MMM CV: RRR PULM: CTA B ABD: Soft, nondistended CNS: Lethargic.  Minimally responsive to painful stimuli/sternal rub EXT: Bilateral leg edema.  Bilateral feet are cool to touch     Data Reviewed:   I have personally reviewed following labs and imaging studies:  Labs: Labs show the following:   Basic Metabolic Panel: Recent Labs  Lab 04/14/20 0150 04/14/20 0150 04/16/20 0437 04/18/20 0509  NA 141  --  140 140  K 4.0   < > 3.8 4.8  CL 106  --  105 107  CO2 24  --  25 19*  GLUCOSE 108*  --  170* 204*  BUN 10  --  10 27*  CREATININE 0.99  --  1.24 2.72*  CALCIUM 8.4*  --  8.2* 8.6*  MG  --   --   --  2.1  PHOS  --   --   --  5.3*   < > = values in this interval not displayed.   GFR Estimated Creatinine Clearance: 32.7 mL/min (A) (by C-G formula based on SCr of 2.72 mg/dL (H)). Liver Function Tests: Recent Labs  Lab 04/14/20 0150  04/18/20 0509  AST 24 24  ALT 9 10  ALKPHOS 69 80  BILITOT 0.3 0.7  PROT 7.2 7.4  ALBUMIN 2.3* 2.3*   No results for input(s): LIPASE, AMYLASE in the last 168 hours. No results for input(s): AMMONIA in the last 168 hours. Coagulation profile Recent Labs  Lab 05/01/2020 1448  INR 1.0    CBC: Recent Labs  Lab 04/15/2020 1956 04/14/20 0150 04/15/20 0014 04/15/20 0318 04/16/20 0437 04/11/2020 0342 04/18/20 0509  WBC 5.0   < > 4.7 4.6 6.4 8.9 12.6*  NEUTROABS 3.8  --   --  3.1  --  7.2 11.2*  HGB 6.9*   < > 8.0* 8.3* 8.5* 8.3* 7.1*  HCT 23.3*   < > 27.0* 27.9* 29.0* 27.9* 24.2*  MCV 74.9*   < > 76.7* 76.6* 77.7* 77.3* 79.3*  PLT 323   < > 352 355 366 402* 391   < > = values in this interval not displayed.   Cardiac Enzymes: No results for input(s): CKTOTAL, CKMB, CKMBINDEX, TROPONINI in the last 168 hours. BNP (last 3 results) No results for input(s): PROBNP in the last 8760 hours. CBG: Recent Labs  Lab 04/10/2020 1314 04/25/2020 1700 05/02/2020 2114 04/18/20 0757 04/18/20 1125  GLUCAP 170* 183* 164* 176* 162*   D-Dimer: No results for input(s): DDIMER in the last 72 hours. Hgb A1c: No results for input(s): HGBA1C in the last 72 hours. Lipid Profile: No results for input(s): CHOL, HDL, LDLCALC, TRIG, CHOLHDL, LDLDIRECT in the last 72 hours. Thyroid function studies: No results for input(s): TSH, T4TOTAL, T3FREE, THYROIDAB in the last 72 hours.  Invalid input(s): FREET3 Anemia work up: No results for input(s): VITAMINB12, FOLATE, FERRITIN, TIBC, IRON, RETICCTPCT in the last 72 hours. Sepsis Labs: Recent Labs  Lab 04/21/2020 1956 04/14/20 0150 04/15/20 0318 04/16/20 0437 04/04/2020 0342 04/06/2020 1448 04/18/20 0509  PROCALCITON  --   --   --   --   --  3.14 4.05  WBC 5.0   < > 4.6 6.4 8.9  --  12.6*  LATICACIDVEN 1.4  --   --   --   --  3.1* 1.6   < > = values in this interval not displayed.    Microbiology Recent Results (from the past 240 hour(s))  Blood  culture (routine single)     Status: None   Collection Time: 05/03/2020  3:58 AM   Specimen: BLOOD  Result Value Ref Range Status   Specimen Description BLOOD BLOOD RIGHT FOREARM  Final   Special Requests   Final    BOTTLES DRAWN AEROBIC AND ANAEROBIC Blood Culture results may not be optimal due to an excessive volume of blood received in culture bottles   Culture   Final    NO GROWTH 5 DAYS Performed at [facility], [address], [place], [place] [address]  Report Status 04/18/2020 FINAL  Final  Respiratory Panel by RT PCR (Flu A&B, Covid) - Nasopharyngeal Swab     Status: None   Collection Time: 04/10/2020  3:58 AM   Specimen: Nasopharyngeal Swab  Result Value Ref Range Status   SARS Coronavirus 2 by RT PCR NEGATIVE NEGATIVE Final    Comment: (NOTE) SARS-CoV-2 target nucleic acids are NOT DETECTED.  The SARS-CoV-2 RNA is generally detectable in upper respiratoy specimens during the acute phase of infection. The lowest concentration of SARS-CoV-2 viral copies this assay can detect is 131 copies/mL. A negative result does not preclude SARS-Cov-2 infection and should not be used as the sole basis for treatment or other patient management decisions. A negative result may occur with  improper specimen collection/handling, submission of specimen other than nasopharyngeal swab, presence of viral mutation(s) within the areas targeted by this assay, and inadequate number of viral copies (<131 copies/mL). A negative result must be combined with clinical observations, patient history, and epidemiological information. The expected result is Negative.  Fact Sheet for Patients:  PinkCheek.be  Fact Sheet for Healthcare Providers:  GravelBags.it  This test is no t yet approved or cleared by the [place] FDA and  has been authorized for detection and/or diagnosis of SARS-CoV-2 by FDA under an Emergency Use  Authorization (EUA). This EUA will remain  in effect (meaning this test can be used) for the duration of the COVID-19 declaration under Section 564(b)(1) of the Act, 21 U.S.C. section 360bbb-3(b)(1), unless the authorization is terminated or revoked sooner.     Influenza A by PCR NEGATIVE NEGATIVE Final   Influenza B by PCR NEGATIVE NEGATIVE Final    Comment: (NOTE) The Xpert Xpress SARS-CoV-2/FLU/RSV assay is intended as an aid in  the diagnosis of influenza from Nasopharyngeal swab specimens and  should not be used as a sole basis for treatment. Nasal washings and  aspirates are unacceptable for Xpert Xpress SARS-CoV-2/FLU/RSV  testing.  Fact Sheet for Patients: PinkCheek.be  Fact Sheet for Healthcare Providers: GravelBags.it  This test is not yet approved or cleared by the [place] FDA and  has been authorized for detection and/or diagnosis of SARS-CoV-2 by  FDA under an Emergency Use Authorization (EUA). This EUA will remain  in effect (meaning this test can be used) for the duration of the  Covid-19 declaration under Section 564(b)(1) of the Act, 21  U.S.C. section 360bbb-3(b)(1), unless the authorization is  terminated or revoked. Performed at [facility], [address] [place], [place] [address]   Culture, blood (single)     Status: Abnormal   Collection Time: 04/08/2020  1:36 PM   Specimen: BLOOD  Result Value Ref Range Status   Specimen Description   Final    BLOOD LEFT [facility] Performed at [facility], [address] [place], [place] [address]    Special Requests   Final    BOTTLES DRAWN AEROBIC ONLY Blood Culture adequate volume Performed at [facility], [address] [place], [place] [address]    Culture  Setup Time   Final    AEROBIC BOTTLE ONLY GRAM POSITIVE RODS CRITICAL RESULT CALLED TO, READ BACK BY AND VERIFIED WITH: LISA KLUTTZ 04/08/2020 AT 1956 BY  ACR Performed at [facility], [address] [place], [place] [address]    Culture (A)  Final    DIPHTHEROIDS(CORYNEBACTERIUM SPECIES) Standardized susceptibility testing for this organism is not available. Performed at [facility], [place] [address], [place], [place]  [address]    Report Status 04/19/2020 FINAL  Final  Urine culture     Status: None   Collection Time: 04/24/2020  3:10 PM   Specimen: Urine, Random  Result Value Ref Range Status   Specimen Description   Final    URINE, RANDOM Performed at [facility], [address], [place], [place] [address]    Special Requests   Final    NONE Performed at [facility], [address] [place], [place] [address]    Culture   Final    NO GROWTH Performed at [facility], [place] [address], [place], [place] [address]    Report Status 04/14/2020 FINAL  Final  CULTURE, BLOOD (ROUTINE X 2) w Reflex to ID Panel     Status: None (Preliminary result)   Collection Time: 04/14/2020  2:48 PM   Specimen: BLOOD  Result Value Ref Range Status   Specimen Description BLOOD RIGHT ANTECUBITAL  Final   Special Requests   Final    BOTTLES DRAWN AEROBIC AND ANAEROBIC Blood Culture adequate volume   Culture   Final    NO GROWTH 3 DAYS Performed at [facility], [address] [place], [place] [address]    Report Status PENDING  Incomplete  CULTURE, BLOOD (ROUTINE X 2) w Reflex to ID Panel     Status: None (Preliminary result)   Collection Time: 04/06/2020  2:48 PM   Specimen: BLOOD  Result Value Ref Range Status   Specimen Description BLOOD BLOOD RIGHT HAND  Final   Special Requests   Final    BOTTLES DRAWN AEROBIC AND ANAEROBIC Blood Culture adequate volume   Culture   Final    NO GROWTH 3 DAYS Performed at [facility], [address], [place], [place] [address]    Report Status PENDING  Incomplete  MRSA PCR Screening     Status: None   Collection Time: 04/18/20 10:08 AM    Specimen: Nasopharyngeal  Result Value Ref Range Status   MRSA by PCR NEGATIVE NEGATIVE Final    Comment:        The GeneXpert MRSA Assay (FDA approved for NASAL specimens only), is one component of a comprehensive MRSA colonization surveillance program. It is not intended to diagnose MRSA infection nor to guide or monitor treatment for MRSA infections. Performed at [facility], [address] [place],  [address]     Procedures and diagnostic studies:  No results found.             LOS: 7 days   Deberah Adolf  [facility]   Pager on [email or web address]. If 7PM-7AM, please contact night-coverage at [email or web address]     04/20/2020, 10:03 AM

## 2020-04-20 NOTE — Progress Notes (Signed)
POA requested for Hunter Davila, & Olivia Mackie to visit if possible. Jeani Hawking and Edd Fabian will visit together and their contact is [phone]. Olivia Mackie may visit and her number is [phone].

## 2020-04-21 DIAGNOSIS — C641 Malignant neoplasm of right kidney, except renal pelvis: Secondary | ICD-10-CM | POA: Diagnosis not present

## 2020-04-21 DIAGNOSIS — J189 Pneumonia, unspecified organism: Secondary | ICD-10-CM | POA: Diagnosis not present

## 2020-04-21 DIAGNOSIS — E43 Unspecified severe protein-calorie malnutrition: Secondary | ICD-10-CM | POA: Diagnosis not present

## 2020-04-21 MED ORDER — POLYVINYL ALCOHOL 1.4 % OP SOLN
1.0000 [drp] | OPHTHALMIC | Status: DC | PRN
  Filled 2020-04-21: qty 15

## 2020-04-21 MED ORDER — HYDROMORPHONE HCL 1 MG/ML IJ SOLN: 0.5000 mg | INTRAMUSCULAR | Status: DC | PRN

## 2020-04-21 MED ORDER — GLYCOPYRROLATE 0.2 MG/ML IJ SOLN
0.1000 mg | Freq: Three times a day (TID) | INTRAMUSCULAR | Status: DC
  Administered 2020-04-21 (×2): 0.1 mg via INTRAVENOUS
  Filled 2020-04-21 (×5): qty 0.5

## 2020-04-21 NOTE — Plan of Care (Signed)
  Problem: Health Behavior/Discharge Planning: Goal: Ability to manage health-related needs will improve Outcome: Not Progressing   

## 2020-04-21 NOTE — Progress Notes (Signed)
[facility] Liaison note:  New referral for [facility] hospice home received from [facility]. Patient information sent to referral. Hospice home eligibility under review.  Writer met in the room with patient's Hunter Davila to initiate education regarding hospice home services, philosophy, team approach to care and current visitation policy with understanding voiced.   Unfortunately [facility] does not have bed availability today. Mateo Flow and hospital care team made aware. Will continue to follow and update all daily or sooner if bed becomes available.  Thank you for the opportunity to be involved in the care of this patient and his caregiver.  Flo Shanks BSN, RN, [place] [phone]

## 2020-04-21 NOTE — Progress Notes (Signed)
Nutrition Brief Follow-Up Note  Chart reviewed. Patient has transitioned to comfort care.   No further nutrition interventions warranted at this time. Please re-consult RD as needed.   Oletta Buehring King, MS, RD, LDN Pager number available on Amion  

## 2020-04-21 NOTE — TOC Initial Note (Signed)
Transition of Care [facility]) - Initial/Assessment Note    Patient Details  Name: Hunter Davila MRN: [id number] Date of Birth: October 11, 1955  [facility]) CM/SW Contact:    Shelbie Ammons, RN Phone Number: 04/21/2020, 11:25 AM  Clinical Narrative:    RNCM met in room with patient's caregiver Mateo Flow. Mateo Flow is agreeable to Hospice services and requests [facility] [address]. Mateo Flow believes that patient would benefit from the Hospice Home. RNCM reached out to Santiago Glad with [facility] with referral.           Expected Discharge Plan: [place]     Patient Goals and CMS Choice        Expected Discharge Plan and Services Expected Discharge Plan: [place]                                              Prior Living Arrangements/Services   Lives with:: Self                   Activities of Daily Living Home Assistive Devices/Equipment: None ADL Screening (condition at time of admission) Patient's cognitive ability adequate to safely complete daily activities?: Yes Is the patient deaf or have difficulty hearing?: No Does the patient have difficulty seeing, even when wearing glasses/contacts?: No Does the patient have difficulty concentrating, remembering, or making decisions?: No Patient able to express need for assistance with ADLs?: Yes Does the patient have difficulty dressing or bathing?: Yes Independently performs ADLs?: No Communication: Independent Dressing (OT): Independent Grooming: Independent Feeding: Independent Bathing: Needs assistance Is this a change from baseline?: Pre-admission baseline Toileting: Needs assistance Is this a change from baseline?: Pre-admission baseline In/Out Bed: Needs assistance Is this a change from baseline?: Pre-admission baseline Walks in Home: Needs assistance Is this a change from baseline?: Pre-admission baseline Does the patient have difficulty walking  or climbing stairs?: Yes Weakness of Legs: Both Weakness of Arms/Hands: Both  Permission Sought/Granted                  Emotional Assessment Appearance:: Appears stated age            Admission diagnosis:  Encephalopathy acute [G93.40] CAP (community acquired pneumonia) [J18.9] Malignant neoplasm of kidney, unspecified laterality ([place]) [C64.9] Sepsis ([place]) [A41.9] Pneumonia of left lower lobe due to infectious organism [J18.9] Patient Active Problem List   Diagnosis Date Noted  . Aspiration pneumonia ([place]) 04/14/2020  . Failure to thrive in adult 04/14/2020  . Iron deficiency anemia due to chronic blood loss 04/14/2020  . Severe protein-calorie malnutrition ([place]) 04/14/2020  . Severe back pain 04/14/2020  . 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[place]) 04/14/2020  . Sepsis ([place]) 04/16/2020  . CAP (community acquired pneumonia) 04/16/2020  . Lactic acidosis 04/08/2020  . Anemia of chronic disease 04/19/2020  . Acute metabolic encephalopathy 19/37/9024  . Renal cancer ([place]) 03/18/2020  . Hypothyroidism 03/18/2020  . Diabetes mellitus without complication ([place])   . Hypertension   . GERD (gastroesophageal reflux disease)   . SIRS (systemic inflammatory response syndrome) (HCC)   . Elevated troponin    PCP:  Patient, No Pcp Per Pharmacy:   [place], [place] [facility] [address] [place] [address] Phone: [phone] Fax: [phone]     Social Determinants of Health (SDOH) Interventions  Readmission Risk Interventions No flowsheet data found.

## 2020-04-21 NOTE — Care Management Important Message (Signed)
Important Message  Patient Details  Name: Hunter Davila MRN: [id number] Date of Birth: April 11, 1956   Medicare Important Message Given:  Yes     Dannette Barbara 04/21/2020, 11:37 AM

## 2020-04-21 NOTE — Progress Notes (Signed)
Progress Note    Hunter Davila  GDJ:[id number] DOB: 1956/04/08  DOA: 04/04/2020 PCP: Patient, No Pcp Per      Brief Narrative:    Medical records reviewed and are as summarized below:  Hunter Davila is a 64 y.o. male with medical history significant for renal cell carcinoma on oral chemotherapy, hypothyroidism, diabetes mellitus, hypertension, GERD.  He was brought to the hospital because of right flank pain and altered mental status.  Reportedly, patient has had intermittent confusion and has been having worsening pain in the right flank and has also been having pain in the back.  He also reported difficulty walking, weakness in bilateral legs and had incontinence of urine.  He had also been dealing with insomnia.  MRI brain revealed acute to subacute left posterior frontal infarct.  There are also 2 lesions suspicious for metastatic disease in the brain.  Neurologist was consulted and dual antiplatelet therapy with aspirin and Plavix for 21 days followed by aspirin monotherapy was recommended.  He was treated with empiric antibiotics for left lower lobe aspiration pneumonia.  He was evaluated by oncologist for metastatic renal cell carcinoma.  There was some concern that chemotherapy was contributing to altered mental status.  He was previously on lenvatinib and axitinib but these have been discontinued because of their association with encephalopathy.  He complained of low back pain.  CT thoracic and lumbar spine showed metastatic lesion in the T12 vertebra.  He was evaluated by the gastroenterologist for acute on chronic anemia.  He was transfused with 1 unit of packed red blood cells and was also given IV iron infusion.  However, there is no evidence of active GI bleeding.  Endoscopic therapy has been deferred because of acute stroke.  He complained of bilateral leg and foot pain and he was found to cold feet and cool distal legs.  There was mottled appearance of  bilateral feet and there was concern for acute ischemia of bilateral lower extremities.  He was seen by the vascular surgeon and he underwent aortogram and selective bilateral lower extremity angiogram.  However, there was no evidence of arterial obstruction. There was concern for sepsis because of worsening altered mental status, tachycardia, hypotension, elevated procalcitonin and recurrence of lactic acidosis.  He was restarted on broad-spectrum IV antibiotics and was also treated with IV fluids.  He developed acute kidney injury and his condition worsened.  Case was discussed with healthcare power of attorney, Ms. Alwyn Pea, who decided that patient should be placed on comfort measures.     Assessment/Plan:   Principal Problem:   Acute metabolic encephalopathy Active Problems:   Renal cancer ([place])   Diabetes mellitus without complication ([place])   Hypertension   GERD (gastroesophageal reflux disease)   Hypothyroidism   CAP (community acquired pneumonia)   Lactic acidosis   Anemia of chronic disease   Aspiration pneumonia (HCC)   Failure to thrive in adult   Iron deficiency anemia due to chronic blood loss   Severe protein-calorie malnutrition (HCC)   Severe back pain   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HCC)   Nutrition Problem: Increased nutrient needs Etiology: cancer and cancer related treatments (metastatic renal cancer on oral chemotherapy)  Signs/Symptoms: estimated needs   Body mass index is 25.81 kg/m.     Status post aortogram and selective bilateral lower extremity angiogram for suspected acute ischemia of bilateral legs on 05/04/2020.  No evidence of arterial obstruction.  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  Patient was evaluated by the hospice nurse today.  He is deemed to be a candidate for discharge to hospice house.  However, there are no beds available at this time.     Diet Order            Diet regular Room service appropriate? Yes; Fluid consistency: Thin  Diet effective now                    Consultants:  Neurologist, Dr. Doy Mince  Oncologist, Dr. Susy Manor  Gastroenterologist, Dr. Bonna Gains  Procedures:  None    Medications:   . glycopyrrolate  0.1 mg Intravenous TID   Continuous Infusions:    Anti-infectives (From admission, onward)   Start     Dose/Rate Route Frequency Ordered Stop   04/19/20 0800  vancomycin (VANCOREADY) IVPB 1250 mg/250 mL  Status:  Discontinued        1,250 mg 166.7 mL/hr over 90 Minutes Intravenous Every 24 hours 04/18/20 0736 04/18/20 1134   04/18/20 2200  ceFEPIme (MAXIPIME) 2 g in sodium chloride 0.9 % 100 mL IVPB  Status:  Discontinued        2 g 200 mL/hr over 30 Minutes Intravenous Every 12 hours 04/18/20 0733 04/18/20 1134   04/18/20 0600  ceFAZolin (ANCEF) IVPB 2g/100 mL premix       Note to Pharmacy: To be given in specials   2 g 200 mL/hr over 30 Minutes Intravenous  Once 04/23/2020 1106 04/29/2020 1222   04/18/20 0500  vancomycin (VANCOREADY) IVPB 1250 mg/250 mL  Status:  Discontinued        1,250 mg 166.7 mL/hr over 90 Minutes Intravenous Every 12 hours 04/12/2020 1644 04/25/2020 1656   04/18/20 0500  vancomycin (VANCOCIN) IVPB 1000 mg/200 mL premix  Status:  Discontinued        1,000 mg 200 mL/hr over 60 Minutes Intravenous Every 12 hours 04/14/2020 1656 04/18/20 0736   04/11/2020 1800  vancomycin (VANCOREADY) IVPB 2000 mg/400 mL        2,000 mg 200 mL/hr over 120 Minutes Intravenous  Once 05/01/2020 1640 05/03/2020 2015   04/09/2020  1800  ceFEPIme (MAXIPIME) 2 g in sodium chloride 0.9 % 100 mL IVPB  Status:  Discontinued        2 g 200 mL/hr over 30 Minutes Intravenous Every 8 hours 04/19/2020 1640 04/18/20 0733   05/03/2020 1108  clindamycin (CLEOCIN) 300 MG/50ML IVPB       Note to Pharmacy: Corlis Hove   : cabinet override      04/27/2020 1108 04/25/2020 2314   04/15/20 1100  amoxicillin-clavulanate (AUGMENTIN) 875-125 MG per tablet 1 tablet  Status:  Discontinued        1 tablet Oral Every 12 hours 04/15/20 1000 04/10/2020 1640   04/15/20 1000  azithromycin (ZITHROMAX) tablet 500 mg  Status:  Discontinued        500 mg Oral Daily 04/15/20 0841 04/15/20 1000   04/14/20 1400  Ampicillin-Sulbactam (UNASYN) 3 g in sodium chloride 0.9 % 100 mL IVPB  Status:  Discontinued  3 g 200 mL/hr over 30 Minutes Intravenous Every 6 hours 04/14/20 1239 04/15/20 1000   04/21/2020 0900  cefTRIAXone (ROCEPHIN) 2 g in sodium chloride 0.9 % 100 mL IVPB  Status:  Discontinued        2 g 200 mL/hr over 30 Minutes Intravenous Every 24 hours 04/04/2020 0848 04/21/2020 0911   04/15/2020 0900  azithromycin (ZITHROMAX) 500 mg in sodium chloride 0.9 % 250 mL IVPB  Status:  Discontinued        500 mg 250 mL/hr over 60 Minutes Intravenous Every 24 hours 05/02/2020 0848 04/26/2020 0911   04/19/2020 0545  cefTRIAXone (ROCEPHIN) 2 g in sodium chloride 0.9 % 100 mL IVPB  Status:  Discontinued        2 g 200 mL/hr over 30 Minutes Intravenous Every 24 hours 04/08/2020 0542 04/14/20 1239   04/09/2020 0545  azithromycin (ZITHROMAX) 500 mg in sodium chloride 0.9 % 250 mL IVPB  Status:  Discontinued        500 mg 250 mL/hr over 60 Minutes Intravenous Every 24 hours 04/30/2020 0542 04/15/20 0841             Family Communication/Anticipated D/C date and plan/Code Status   DVT prophylaxis:      Code Status: DNR  Family Communication: None Disposition Plan:    Status is: Inpatient  Remains inpatient appropriate because:Ongoing diagnostic testing needed not  appropriate for outpatient work up and Unsafe d/c plan   Dispo: The patient is from: Home              Anticipated d/c is to: Hospice house              Anticipated d/c date is: 2 days              Patient currently is not medically stable to d/c.           Subjective:   Patient is lethargic, unable to provide any history.  Objective:    Vitals:   04/19/20 0801 04/20/20 0805 04/20/20 2224 04/21/20 1409  BP: 98/65 (!) 90/57 (!) 89/57 (!) 79/56  Pulse: (!) 111 (!) 106 (!) 102 (!) 102  Resp: 10 (!) 22 20 18   Temp: 98.9 F (37.2 C) 98.7 F (37.1 C) 98.7 F (37.1 C) 98.4 F (36.9 C)  TempSrc: Oral  Oral   SpO2: 92% 96% 98% 96%  Weight:      Height:       No data found.   Intake/Output Summary (Last 24 hours) at 04/21/2020 1505 Last data filed at 04/21/2020 1000 Gross per 24 hour  Intake 0 ml  Output --  Net 0 ml   Filed Weights   04/15/2020 0345 04/14/2020 1132  Weight: 91.2 kg 91.2 kg    Exam:  GEN: NAD SKIN: Warm and dry EYES: EOMI ENT: Dry mucous membranes CV: RRR PULM: CTA B ABD: Soft CNS: Unresponsive EXT: Bilateral leg edema      Data Reviewed:   I have personally reviewed following labs and imaging studies:  Labs: Labs show the following:   Basic Metabolic Panel: Recent Labs  Lab 04/16/20 0437 04/18/20 0509  NA 140 140  K 3.8 4.8  CL 105 107  CO2 25 19*  GLUCOSE 170* 204*  BUN 10 27*  CREATININE 1.24 2.72*  CALCIUM 8.2* 8.6*  MG  --  2.1  PHOS  --  5.3*   GFR Estimated Creatinine Clearance: 32.7 mL/min (A) (by C-G formula based on SCr of  2.72 mg/dL (H)). Liver Function Tests: Recent Labs  Lab 04/18/20 0509  AST 24  ALT 10  ALKPHOS 80  BILITOT 0.7  PROT 7.4  ALBUMIN 2.3*   No results for input(s): LIPASE, AMYLASE in the last 168 hours. No results for input(s): AMMONIA in the last 168 hours. Coagulation profile Recent Labs  Lab 04/16/2020 1448  INR 1.0    CBC: Recent Labs  Lab 04/15/20 0014 04/15/20 0318  04/16/20 0437 04/26/2020 0342 04/18/20 0509  WBC 4.7 4.6 6.4 8.9 12.6*  NEUTROABS  --  3.1  --  7.2 11.2*  HGB 8.0* 8.3* 8.5* 8.3* 7.1*  HCT 27.0* 27.9* 29.0* 27.9* 24.2*  MCV 76.7* 76.6* 77.7* 77.3* 79.3*  PLT 352 355 366 402* 391   Cardiac Enzymes: No results for input(s): CKTOTAL, CKMB, CKMBINDEX, TROPONINI in the last 168 hours. BNP (last 3 results) No results for input(s): PROBNP in the last 8760 hours. CBG: Recent Labs  Lab 04/18/2020 1314 04/15/2020 1700 05/02/2020 2114 04/18/20 0757 04/18/20 1125  GLUCAP 170* 183* 164* 176* 162*   D-Dimer: No results for input(s): DDIMER in the last 72 hours. Hgb A1c: No results for input(s): HGBA1C in the last 72 hours. Lipid Profile: No results for input(s): CHOL, HDL, LDLCALC, TRIG, CHOLHDL, LDLDIRECT in the last 72 hours. Thyroid function studies: No results for input(s): TSH, T4TOTAL, T3FREE, THYROIDAB in the last 72 hours.  Invalid input(s): FREET3 Anemia work up: No results for input(s): VITAMINB12, FOLATE, FERRITIN, TIBC, IRON, RETICCTPCT in the last 72 hours. Sepsis Labs: Recent Labs  Lab 04/15/20 0318 04/16/20 0437 04/19/2020 0342 04/06/2020 1448 04/18/20 0509  PROCALCITON  --   --   --  3.14 4.05  WBC 4.6 6.4 8.9  --  12.6*  LATICACIDVEN  --   --   --  3.1* 1.6    Microbiology Recent Results (from the past 240 hour(s))  Blood culture (routine single)     Status: None   Collection Time: 04/15/2020  3:58 AM   Specimen: BLOOD  Result Value Ref Range Status   Specimen Description BLOOD BLOOD RIGHT FOREARM  Final   Special Requests   Final    BOTTLES DRAWN AEROBIC AND ANAEROBIC Blood Culture results may not be optimal due to an excessive volume of blood received in culture bottles   Culture   Final    NO GROWTH 5 DAYS Performed at [facility], [address] [place], [place] [address]    Report Status 04/18/2020 FINAL  Final  Respiratory Panel by RT PCR (Flu A&B, Covid) - Nasopharyngeal Swab     Status: None    Collection Time: 04/15/2020  3:58 AM   Specimen: Nasopharyngeal Swab  Result Value Ref Range Status   SARS Coronavirus 2 by RT PCR NEGATIVE NEGATIVE Final    Comment: (NOTE) SARS-CoV-2 target nucleic acids are NOT DETECTED.  The SARS-CoV-2 RNA is generally detectable in upper respiratoy specimens during the acute phase of infection. The lowest concentration of SARS-CoV-2 viral copies this assay can detect is 131 copies/mL. A negative result does not preclude SARS-Cov-2 infection and should not be used as the sole basis for treatment or other patient management decisions. A negative result may occur with  improper specimen collection/handling, submission of specimen other than nasopharyngeal swab, presence of viral mutation(s) within the areas targeted by this assay, and inadequate number of viral copies (<131 copies/mL). A negative result must be combined with clinical observations, patient history, and epidemiological information. The expected result is  Negative.  Fact Sheet for Patients:  PinkCheek.be  Fact Sheet for Healthcare Providers:  GravelBags.it  This test is no t yet approved or cleared by the [place] FDA and  has been authorized for detection and/or diagnosis of SARS-CoV-2 by FDA under an Emergency Use Authorization (EUA). This EUA will remain  in effect (meaning this test can be used) for the duration of the COVID-19 declaration under Section 564(b)(1) of the Act, 21 U.S.C. section 360bbb-3(b)(1), unless the authorization is terminated or revoked sooner.     Influenza A by PCR NEGATIVE NEGATIVE Final   Influenza B by PCR NEGATIVE NEGATIVE Final    Comment: (NOTE) The Xpert Xpress SARS-CoV-2/FLU/RSV assay is intended as an aid in  the diagnosis of influenza from Nasopharyngeal swab specimens and  should not be used as a sole basis for treatment. Nasal washings and  aspirates are unacceptable for Xpert  Xpress SARS-CoV-2/FLU/RSV  testing.  Fact Sheet for Patients: PinkCheek.be  Fact Sheet for Healthcare Providers: GravelBags.it  This test is not yet approved or cleared by the [place] FDA and  has been authorized for detection and/or diagnosis of SARS-CoV-2 by  FDA under an Emergency Use Authorization (EUA). This EUA will remain  in effect (meaning this test can be used) for the duration of the  Covid-19 declaration under Section 564(b)(1) of the Act, 21  U.S.C. section 360bbb-3(b)(1), unless the authorization is  terminated or revoked. Performed at [facility], [address] [place], [place] [address]   Culture, blood (single)     Status: Abnormal   Collection Time: 04/08/2020  1:36 PM   Specimen: BLOOD  Result Value Ref Range Status   Specimen Description   Final    BLOOD LEFT [facility] Performed at [facility], [address] [place], [place] [address]    Special Requests   Final    BOTTLES DRAWN AEROBIC ONLY Blood Culture adequate volume Performed at [facility], [address] [place], [place] [address]    Culture  Setup Time   Final    AEROBIC BOTTLE ONLY GRAM POSITIVE RODS CRITICAL RESULT CALLED TO, READ BACK BY AND VERIFIED WITH: LISA KLUTTZ 04/27/2020 AT 1956 BY ACR Performed at [facility], [address] [place], [place] [address]    Culture (A)  Final    DIPHTHEROIDS(CORYNEBACTERIUM SPECIES) Standardized susceptibility testing for this organism is not available. Performed at [facility], [place] [address], [place], [place] [address]    Report Status 04/19/2020 FINAL  Final  Urine culture     Status: None   Collection Time: 04/05/2020  3:10 PM   Specimen: Urine, Random  Result Value Ref Range Status   Specimen Description   Final    URINE, RANDOM Performed at [facility], [address], [place], [place] [address]    Special Requests   Final      NONE Performed at [facility], [address], [place], [place] [address]    Culture   Final    NO GROWTH Performed at [facility], [place] [address] [place], [place] [address]    Report Status 04/14/2020 FINAL  Final  CULTURE, BLOOD (ROUTINE X 2) w Reflex to ID Panel     Status: None (Preliminary result)   Collection Time: 04/16/2020  2:48 PM   Specimen: BLOOD  Result Value Ref Range Status   Specimen Description BLOOD RIGHT ANTECUBITAL  Final   Special Requests   Final  BOTTLES DRAWN AEROBIC AND ANAEROBIC Blood Culture adequate volume   Culture   Final    NO GROWTH 4 DAYS Performed at [facility], [address] [place], [place] [address]    Report Status PENDING  Incomplete  CULTURE, BLOOD (ROUTINE X 2) w Reflex to ID Panel     Status: None (Preliminary result)   Collection Time: 04/05/2020  2:48 PM   Specimen: BLOOD  Result Value Ref Range Status   Specimen Description BLOOD BLOOD RIGHT HAND  Final   Special Requests   Final    BOTTLES DRAWN AEROBIC AND ANAEROBIC Blood Culture adequate volume   Culture   Final    NO GROWTH 4 DAYS Performed at [facility], [address], [place], [place] [address]    Report Status PENDING  Incomplete  MRSA PCR Screening     Status: None   Collection Time: 04/18/20 10:08 AM   Specimen: Nasopharyngeal  Result Value Ref Range Status   MRSA by PCR NEGATIVE NEGATIVE Final    Comment:        The GeneXpert MRSA Assay (FDA approved for NASAL specimens only), is one component of a comprehensive MRSA colonization surveillance program. It is not intended to diagnose MRSA infection nor to guide or monitor treatment for MRSA infections. Performed at [facility], [address] [place], [place] [address]     Procedures and diagnostic studies:  No results found.             LOS: 8 days   Zeena Starkel  [facility]   Pager on [email or web address]. If 7PM-7AM, please contact  night-coverage at [email or web address]     04/21/2020, 3:05 PM

## 2020-04-22 LAB — CULTURE, BLOOD (ROUTINE X 2)
Culture: NO GROWTH
Culture: NO GROWTH
Special Requests: ADEQUATE
Special Requests: ADEQUATE

## 2020-05-05 NOTE — Death Summary Note (Addendum)
Death Summary      Hunter Davila:[id number] DOB: October 21, 1955 DOA: 04/21/2020  PCP: Patient, No Pcp Per   Admit date: April 21, 2020 Date of Death: 2020-04-30   Final Diagnoses:   Principal Problem:   Severe sepsis ([place]) Active Problems:   Acute metabolic encephalopathy   Renal cancer ([place])   Diabetes mellitus without complication ([place])   Hypertension   GERD (gastroesophageal reflux disease)   Hypothyroidism   CAP (community acquired pneumonia)   Lactic acidosis   Anemia of chronic disease   Aspiration pneumonia ([place])   Failure to thrive in adult   Iron deficiency anemia due to chronic blood loss   Severe protein-calorie malnutrition (HCC)   Severe back pain   Lacunar stroke [facility])     Hospital Course:    Hunter Davila is a 64 y.o. male with medical history significant for renal cell carcinoma on oral chemotherapy, hypothyroidism, diabetes mellitus, hypertension, GERD.  He was brought to the hospital because of right flank pain and altered mental status.  Reportedly, patient has had intermittent confusion and has been having worsening pain in the right flank and has also been having pain in the back.  He also reported difficulty walking, weakness in bilateral legs and had incontinence of urine.  He had also been dealing with insomnia.  MRI brain revealed acute to subacute left posterior frontal infarct.  There are also 2 lesions suspicious for metastatic disease in the brain.  Neurologist was consulted and dual antiplatelet therapy with aspirin and Plavix for 21 days followed by aspirin monotherapy was recommended.  He was treated with empiric antibiotics for left lower lobe aspiration pneumonia.  He was evaluated by oncologist for metastatic renal cell carcinoma.  There was some concern that chemotherapy was contributing to altered mental status.  He was previously on lenvatinib and axitinib but these have been discontinued because of their association with  encephalopathy.  He complained of low back pain.  CT thoracic and lumbar spine showed metastatic lesion in the T12 vertebra.  He was evaluated by the gastroenterologist for acute on chronic anemia.  He was transfused with 1 unit of packed red blood cells and was also given IV iron infusion.  However, there is no evidence of active GI bleeding.  Endoscopic therapy has been deferred because of acute stroke.  He complained of bilateral leg and foot pain and he was found to cold feet and cool distal legs.  There was mottled appearance of bilateral feet and there was concern for acute ischemia of bilateral lower extremities.  He was seen by the vascular surgeon and he underwent aortogram and selective bilateral lower extremity angiogram.  However, there was no evidence of arterial obstruction. There was concern for severe sepsis from pneumonia because of worsening altered mental status, tachycardia, hypotension, elevated procalcitonin and recurrence of lactic acidosis (3.1).  He was restarted on broad-spectrum IV antibiotics and was also treated with IV fluids.  He developed acute kidney injury and his condition worsened. Sepsis was not present on admission.  Case was discussed with healthcare power of attorney, Ms. Hunter Davila, who decided that patient should be placed on comfort measures.  His condition deteriorated and he expired on 04-30-20 at 8: 51 AM.    Time of death: 8:51 am  Signed:  Jennye Boroughs  [facility] 04/23/2020, 9:31 AM

## 2020-05-05 NOTE — Progress Notes (Signed)
Writer in making rounds to assess patient. Patient was not breathing, no heart beat nor pulse obtained. Patient was pronounced by x2 RNs, time of death 27. MD notified, charge nurse, nursing supervisor, and [facility]. Writer notified patient's POA Mateo Flow to make aware and she "stated she was on her way to say her goodbyes."

## 2020-05-05 DEATH — deceased

## 2022-04-21 IMAGING — CT CT L SPINE W/O CM
3 of 5 series · 10 of 33 positions shown, 12 images · non-contrast
Comparison: Thoracic spine CT today.

CLINICAL DATA: 64-year-old male with mid and lower back pain.
Progressive neurologic deficit. Altered mental status. Metastatic
renal cell carcinoma.

EXAM:
CT LUMBAR SPINE WITHOUT CONTRAST
TECHNIQUE: Multidetector CT imaging of the lumbar spine was performed without
intravenous contrast administration. Multiplanar CT image
reconstructions were also generated.

[Series 8: sagittal bone · sagittal · 0.29mm/px · 5 of 78 slices shown, 6 images]
[im 26/78  bone]
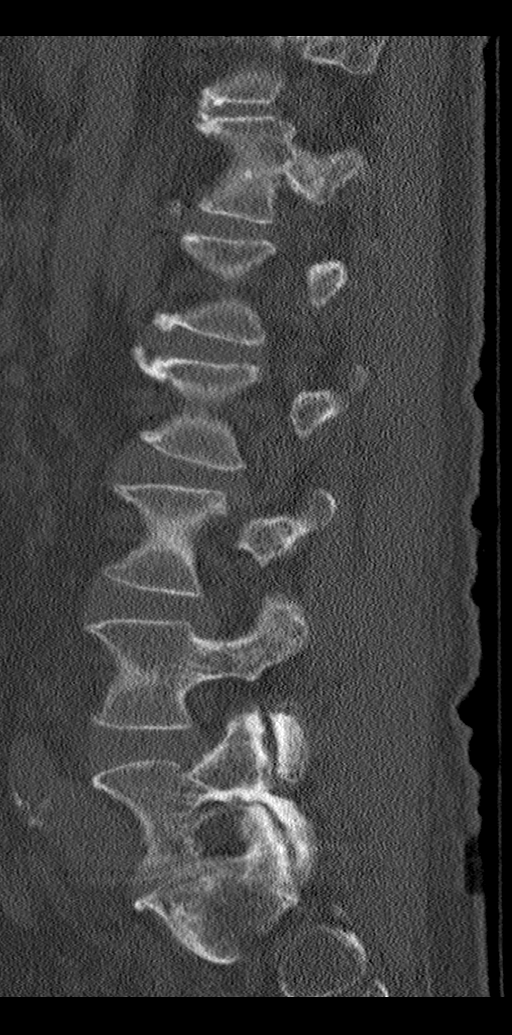
[im 33/78  bone]
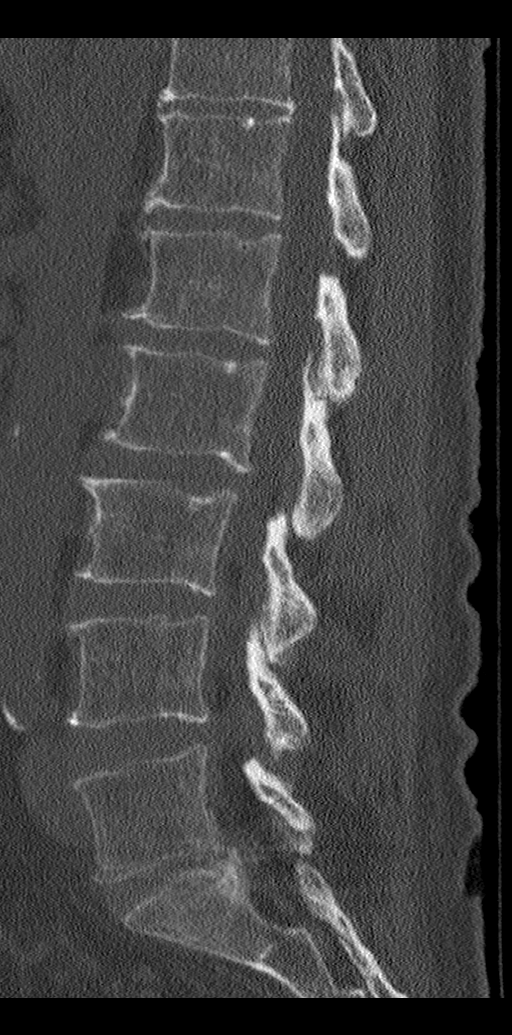
[im 39/78  soft-tissue]
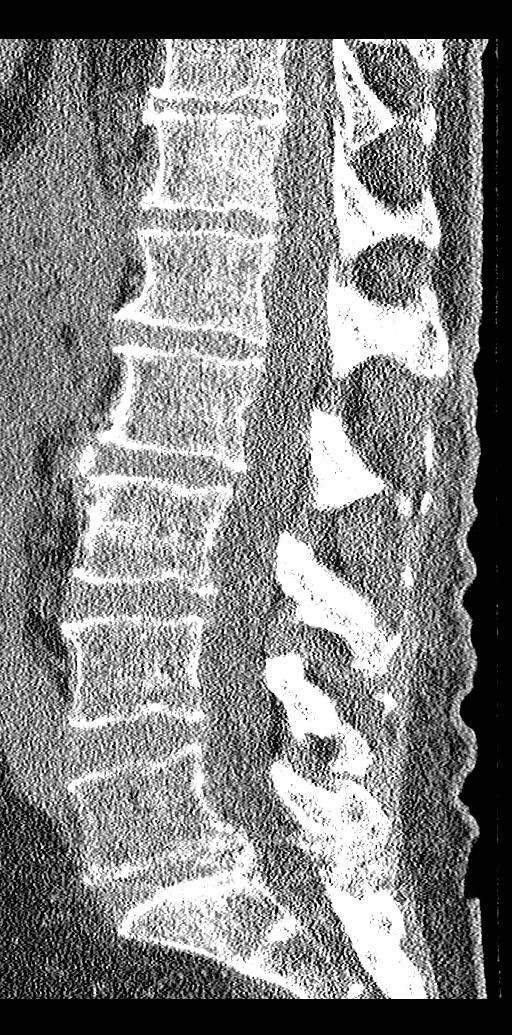
[im 39/78  bone]
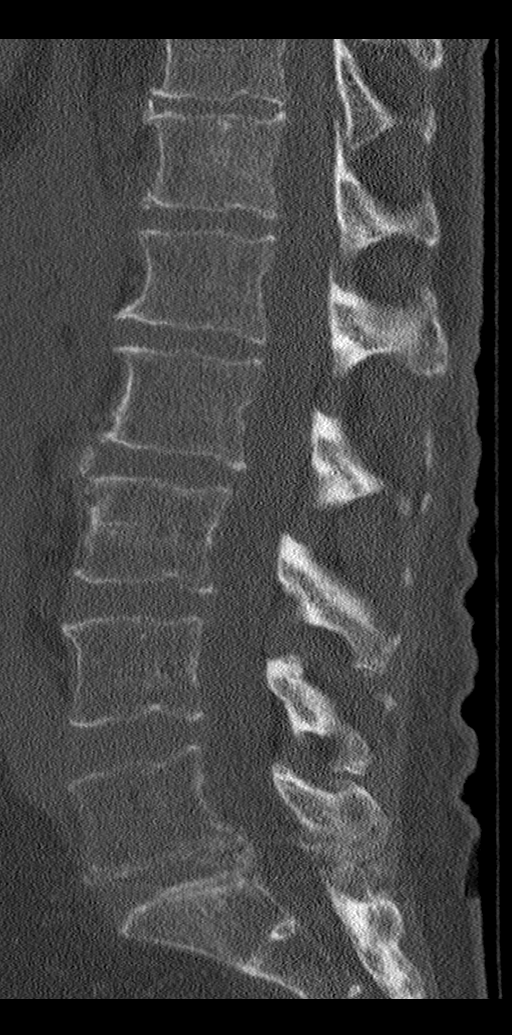
[im 45/78  bone]
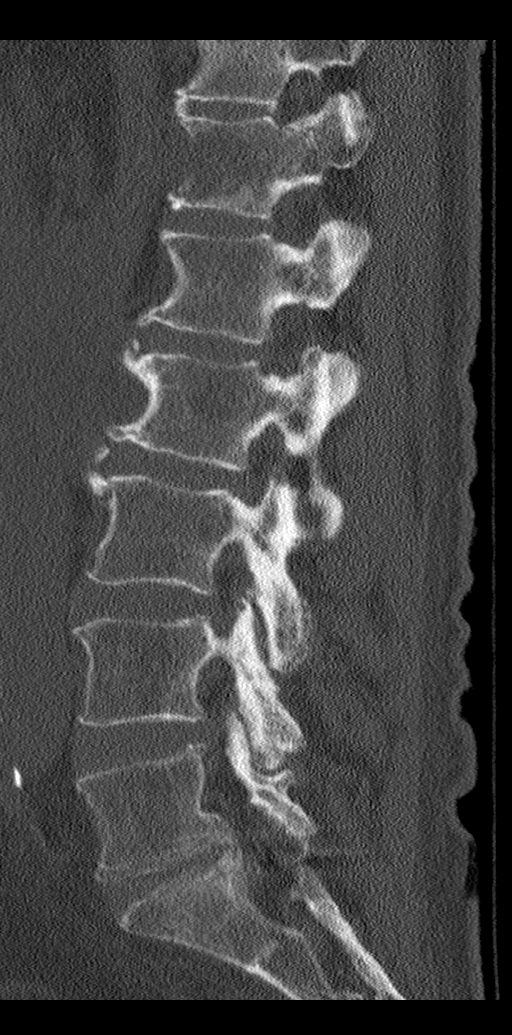
[im 52/78  bone]
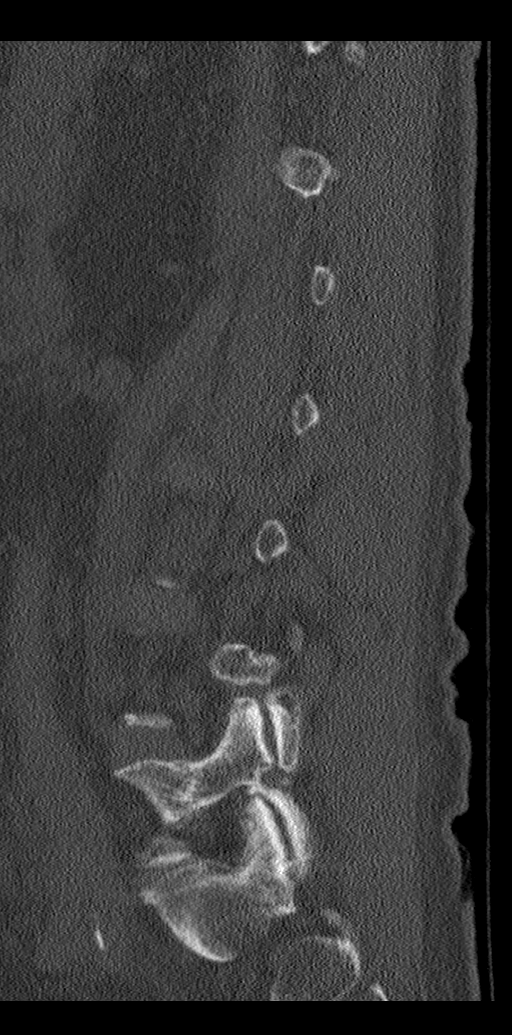

[Series 9: coronal bone · coronal · 0.36mm/px · 3 of 69 slices shown]
[im 14/69  bone]
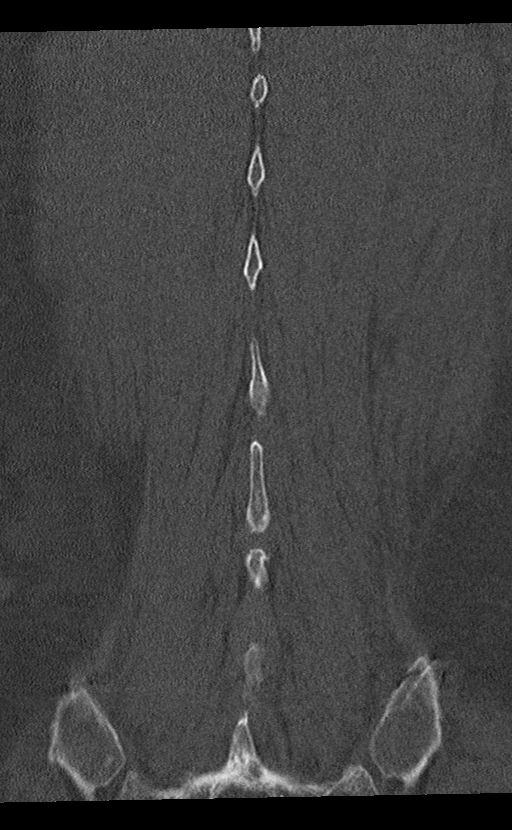
[im 28/69  bone]
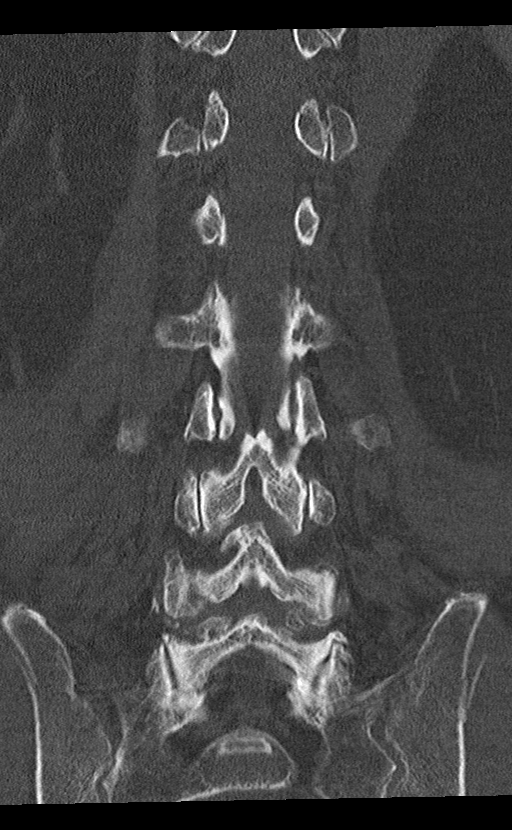
[im 41/69  bone]
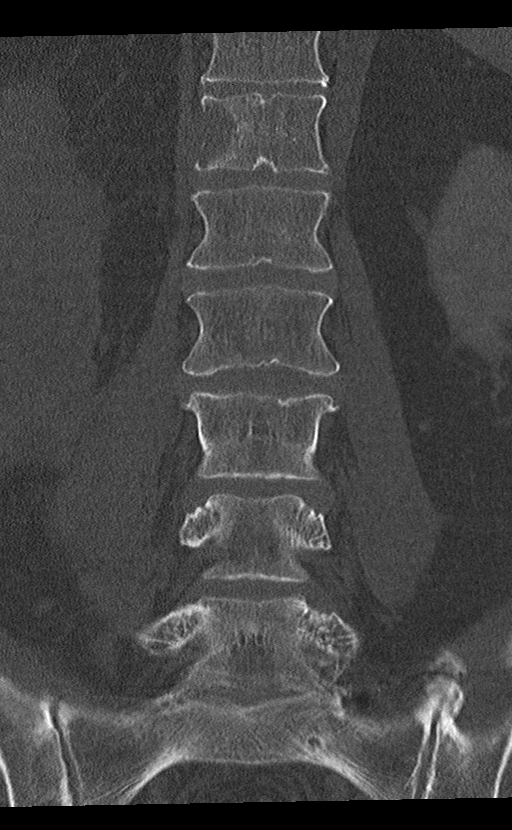

[Series 10: multi disc · axial · 0.33mm/px · z∈[-444,-330]mm · 2 of 140 slices shown, 3 images]
[im 56/140  soft-tissue]
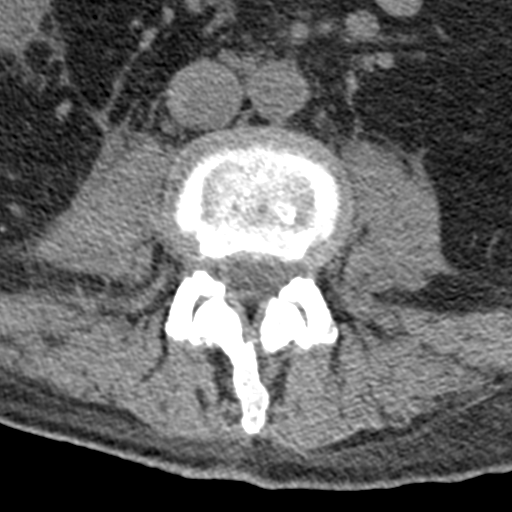
[im 56/140  bone]
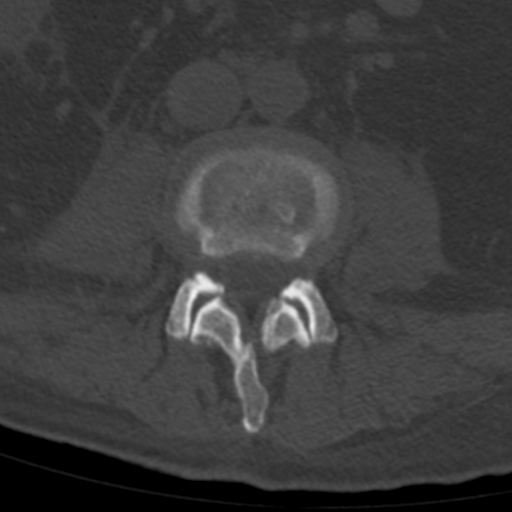
[im 112/140  bone]
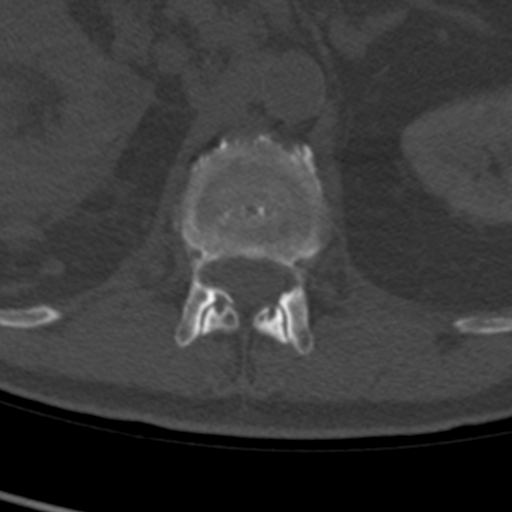

[10 of 33 positions shown; findings below may reference images not displayed]

FINDINGS: Segmentation: Normal. This is concordant with the thoracic numbering
today.

Alignment: Mild motion artifact at the L5-S1 level. Normal lumbar
vertebral height and alignment.

Vertebrae: Lytic metastasis of the right lateral T12 body reported
separately today.

Osteopenia.

No lumbar vertebral metastasis evident by CT. No metastasis in the
visible sacrum.

Paraspinal and other soft tissues: Partially visible very large
right renal tumor (series 5, image 52) with bulky retroperitoneal
nodal metastases with extracapsular extension (same image).
Partially visible abnormal right adrenal gland. Partially visible
nonspecific retroperitoneal and presacral stranding in the pelvis
greater on the right.

Disc levels: Lumbar spine degeneration is concordant with age. Mild
disc bulging and endplate spurring with no significant lumbar spinal
stenosis.
IMPRESSION: 1. No lumbar metastasis evident by CT.
2. No lumbar acute osseous abnormality. Age-appropriate lumbar spine
degeneration.
3. Very large right renal tumor, adrenal and retroperitoneal nodal
metastases with extracapsular extension are partially visible.
